# Patient Record
Sex: Female | Born: 1960
Health system: Southern US, Community
[De-identification: ages and names within clinical notes are randomized; demographics above are authoritative.]

## PROBLEM LIST (undated history)

## (undated) DIAGNOSIS — M069 Rheumatoid arthritis, unspecified: Secondary | ICD-10-CM

## (undated) DIAGNOSIS — D649 Anemia, unspecified: Secondary | ICD-10-CM

## (undated) DIAGNOSIS — F32A Depression, unspecified: Secondary | ICD-10-CM

## (undated) DIAGNOSIS — C3490 Malignant neoplasm of unspecified part of unspecified bronchus or lung: Secondary | ICD-10-CM

## (undated) DIAGNOSIS — Z9289 Personal history of other medical treatment: Secondary | ICD-10-CM

## (undated) DIAGNOSIS — J302 Other seasonal allergic rhinitis: Secondary | ICD-10-CM

## (undated) DIAGNOSIS — F17200 Nicotine dependence, unspecified, uncomplicated: Secondary | ICD-10-CM

## (undated) DIAGNOSIS — R0602 Shortness of breath: Secondary | ICD-10-CM

## (undated) DIAGNOSIS — F419 Anxiety disorder, unspecified: Secondary | ICD-10-CM

## (undated) DIAGNOSIS — L409 Psoriasis, unspecified: Secondary | ICD-10-CM

## (undated) DIAGNOSIS — I10 Essential (primary) hypertension: Secondary | ICD-10-CM

## (undated) DIAGNOSIS — K219 Gastro-esophageal reflux disease without esophagitis: Secondary | ICD-10-CM

## (undated) HISTORY — PX: ABDOMINAL HYSTERECTOMY: SHX81

## (undated) HISTORY — PX: ANKLE SURGERY: SHX546

## (undated) HISTORY — DX: Malignant neoplasm of unspecified part of unspecified bronchus or lung: C34.90

## (undated) HISTORY — PX: DILATION AND CURETTAGE OF UTERUS: SHX78

## (undated) HISTORY — PX: COLONOSCOPY W/ POLYPECTOMY: SHX1380

## (undated) HISTORY — DX: Anxiety disorder, unspecified: F41.9

## (undated) HISTORY — DX: Psoriasis, unspecified: L40.9

## (undated) HISTORY — DX: Rheumatoid arthritis, unspecified: M06.9

## (undated) HISTORY — DX: Depression, unspecified: F32.A

## (undated) HISTORY — PX: WISDOM TOOTH EXTRACTION: SHX21

## (undated) HISTORY — DX: Anemia, unspecified: D64.9

## (undated) HISTORY — PX: TUBAL LIGATION: SHX77

---

## 1983-03-30 HISTORY — PX: DIAGNOSTIC LAPAROSCOPY: SUR761

## 2011-08-28 DIAGNOSIS — Z9289 Personal history of other medical treatment: Secondary | ICD-10-CM

## 2011-08-28 HISTORY — DX: Personal history of other medical treatment: Z92.89

## 2011-11-15 ENCOUNTER — Encounter (HOSPITAL_COMMUNITY): Payer: Self-pay | Admitting: Pharmacist

## 2011-11-23 NOTE — H&P (Signed)
Susan Franco is an 51 y.o. female.   Chief Complaint:bleeding HPI: G29P2 with gushing mentrual flow, with clots, soils through clothes.  Happens over last 2 years.    PMH:  HTN,  Elevated cholesterol  PSH:  Salpingectomy for ectopic pregnancy  All:  NKDA  Meds:  Atenolol, amlopidine, omeprazole  Social History:  Married, Eddie.  Smokes 1/2 ppd.  occ alc.  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    Review of Systems  All other systems reviewed and are negative.    Height 6' 1.5" (1.867 m), weight 87.544 kg (193 lb). Physical Exam  Constitutional: She is oriented to person, place, and time. She appears well-developed and well-nourished.  HENT:  Head: Normocephalic and atraumatic.  Eyes: Conjunctivae are normal.  Neck: Normal range of motion. Neck supple. No thyromegaly present.  Cardiovascular: Normal rate, regular rhythm and normal heart sounds.   Respiratory: Effort normal and breath sounds normal.  GI: Soft. Bowel sounds are normal. She exhibits no distension. There is no tenderness.  Genitourinary: Vagina normal.       Uterus nodular 10-12 week size  Adnexa neg  Musculoskeletal: Normal range of motion.  Neurological: She is alert and oriented to person, place, and time.  Skin: Skin is warm and dry.  Psychiatric: She has a normal mood and affect.     Assessment/Plan Fibroid uterus, menorrhagia.  Plan R-TLH, salpingectomy  Susan Franco P 11/23/2011, 5:18 PM

## 2011-11-25 ENCOUNTER — Encounter (HOSPITAL_COMMUNITY): Payer: Self-pay

## 2011-11-25 ENCOUNTER — Encounter (HOSPITAL_COMMUNITY)
Admission: RE | Admit: 2011-11-25 | Discharge: 2011-11-25 | Disposition: A | Discharge status: Home / Self Care | Payer: No Typology Code available for payment source | Source: Ambulatory Visit | Attending: Obstetrics and Gynecology | Admitting: Obstetrics and Gynecology

## 2011-11-25 HISTORY — DX: Essential (primary) hypertension: I10

## 2011-11-25 HISTORY — DX: Personal history of other medical treatment: Z92.89

## 2011-11-25 HISTORY — DX: Nicotine dependence, unspecified, uncomplicated: F17.200

## 2011-11-25 HISTORY — DX: Gastro-esophageal reflux disease without esophagitis: K21.9

## 2011-11-25 HISTORY — DX: Other seasonal allergic rhinitis: J30.2

## 2011-11-25 LAB — CBC
HCT: 37.6 % (ref 36.0–46.0)
Hemoglobin: 13.2 g/dL (ref 12.0–15.0)
MCH: 28.6 pg (ref 26.0–34.0)
MCHC: 35.1 g/dL (ref 30.0–36.0)
MCV: 81.6 fL (ref 78.0–100.0)
Platelets: 240 10*3/uL (ref 150–400)
RBC: 4.61 MIL/uL (ref 3.87–5.11)
RDW: 17.4 % — ABNORMAL HIGH (ref 11.5–15.5)
WBC: 7 10*3/uL (ref 4.0–10.5)

## 2011-11-25 LAB — SURGICAL PCR SCREEN
MRSA, PCR: NEGATIVE
Staphylococcus aureus: NEGATIVE

## 2011-11-25 NOTE — Pre-Procedure Instructions (Signed)
Order needs clarification of which tube to be removed or stated remaining tube---pt had an ectopic and had a removal and can't remember which tube was removed -called office and waiting for return call.

## 2011-11-25 NOTE — Patient Instructions (Addendum)
   Your procedure is scheduled on: Tuesday September 3rd  Enter through the [employer] of [facility] at: [employer] up the phone at the desk and dial [phone] and inform us of your arrival.  Please call this number if you have any problems the morning of surgery: [phone]  Remember: Do not eat or drink anything after midnight Monday Please take your blood pressure medicines (norvasc and atenolol) and prilosec in the morning day of surgery with sips of water  Do not wear jewelry, make-up, or FINGER nail polish No metal in your hair or on your body. Do not wear lotions, powders, perfumes or deodorant. Do not shave 48 hours prior to surgery. Do not bring valuables to the hospital.   Leave suitcase in the car. After Surgery it may be brought to your room. For patients being admitted to the hospital, checkout time is 11:00am the day of discharge.  Patients discharged on the day of surgery will not be allowed to drive home.     Remember to use your hibiclens as instructed.Please shower with 1/2 bottle the evening before your surgery and the other 1/2 bottle the morning of surgery. Neck down avoiding private area.

## 2011-11-29 MED ORDER — DEXTROSE 5 % IV SOLN
2.0000 g | INTRAVENOUS | Status: AC
  Administered 2011-11-30: 2 g via INTRAVENOUS
  Filled 2011-11-29: qty 2

## 2011-11-30 ENCOUNTER — Encounter (HOSPITAL_COMMUNITY): Payer: Self-pay | Admitting: *Deleted

## 2011-11-30 ENCOUNTER — Encounter (HOSPITAL_COMMUNITY)
Admission: RE | Disposition: A | Discharge status: Home / Self Care | Payer: Self-pay | Source: Ambulatory Visit | Attending: Obstetrics and Gynecology

## 2011-11-30 ENCOUNTER — Encounter (HOSPITAL_COMMUNITY): Payer: Self-pay | Admitting: Anesthesiology

## 2011-11-30 ENCOUNTER — Ambulatory Visit (HOSPITAL_COMMUNITY)
Admission: RE | Admit: 2011-11-30 | Discharge: 2011-12-01 | Disposition: A | Discharge status: Home / Self Care | Payer: No Typology Code available for payment source | Source: Ambulatory Visit | Attending: Obstetrics and Gynecology | Admitting: Obstetrics and Gynecology

## 2011-11-30 ENCOUNTER — Ambulatory Visit (HOSPITAL_COMMUNITY): Payer: No Typology Code available for payment source | Admitting: Anesthesiology

## 2011-11-30 DIAGNOSIS — N920 Excessive and frequent menstruation with regular cycle: Secondary | ICD-10-CM | POA: Insufficient documentation

## 2011-11-30 DIAGNOSIS — Z01812 Encounter for preprocedural laboratory examination: Secondary | ICD-10-CM | POA: Insufficient documentation

## 2011-11-30 DIAGNOSIS — Z01818 Encounter for other preprocedural examination: Secondary | ICD-10-CM | POA: Insufficient documentation

## 2011-11-30 DIAGNOSIS — N8000 Endometriosis of the uterus, unspecified: Secondary | ICD-10-CM | POA: Insufficient documentation

## 2011-11-30 DIAGNOSIS — N838 Other noninflammatory disorders of ovary, fallopian tube and broad ligament: Secondary | ICD-10-CM | POA: Insufficient documentation

## 2011-11-30 DIAGNOSIS — D252 Subserosal leiomyoma of uterus: Secondary | ICD-10-CM | POA: Insufficient documentation

## 2011-11-30 DIAGNOSIS — D250 Submucous leiomyoma of uterus: Secondary | ICD-10-CM | POA: Insufficient documentation

## 2011-11-30 DIAGNOSIS — D251 Intramural leiomyoma of uterus: Secondary | ICD-10-CM | POA: Insufficient documentation

## 2011-11-30 HISTORY — PX: CYSTOSCOPY: SHX5120

## 2011-11-30 SURGERY — ROBOTIC ASSISTED TOTAL HYSTERECTOMY
Anesthesia: General | Wound class: Clean Contaminated

## 2011-11-30 MED ORDER — ARTIFICIAL TEARS OP OINT
TOPICAL_OINTMENT | OPHTHALMIC | Status: AC
  Filled 2011-11-30: qty 3.5

## 2011-11-30 MED ORDER — NEOSTIGMINE METHYLSULFATE 1 MG/ML IJ SOLN
INTRAMUSCULAR | Status: DC | PRN
  Administered 2011-11-30: 2 mg via INTRAVENOUS

## 2011-11-30 MED ORDER — INDIGOTINDISULFONATE SODIUM 8 MG/ML IJ SOLN
INTRAMUSCULAR | Status: DC | PRN
  Administered 2011-11-30: 5 mL via INTRAVENOUS

## 2011-11-30 MED ORDER — HYDROMORPHONE HCL PF 1 MG/ML IJ SOLN
0.2500 mg | INTRAMUSCULAR | Status: DC | PRN
  Administered 2011-11-30: 0.5 mg via INTRAVENOUS

## 2011-11-30 MED ORDER — HYDROMORPHONE HCL PF 1 MG/ML IJ SOLN
INTRAMUSCULAR | Status: AC
  Filled 2011-11-30: qty 1

## 2011-11-30 MED ORDER — PROPOFOL 10 MG/ML IV EMUL
INTRAVENOUS | Status: DC | PRN
  Administered 2011-11-30: 200 mg via INTRAVENOUS

## 2011-11-30 MED ORDER — OXYCODONE-ACETAMINOPHEN 5-325 MG PO TABS
1.0000 | ORAL_TABLET | ORAL | Status: DC | PRN
  Administered 2011-11-30 – 2011-12-01 (×5): 2 via ORAL
  Filled 2011-11-30 (×5): qty 2

## 2011-11-30 MED ORDER — NEOSTIGMINE METHYLSULFATE 1 MG/ML IJ SOLN
INTRAMUSCULAR | Status: AC
  Filled 2011-11-30: qty 10

## 2011-11-30 MED ORDER — ROPIVACAINE HCL 5 MG/ML IJ SOLN
INTRAMUSCULAR | Status: DC | PRN
  Administered 2011-11-30: 85 mL via EPIDURAL

## 2011-11-30 MED ORDER — MORPHINE SULFATE 4 MG/ML IJ SOLN: 1.0000 mg | INTRAMUSCULAR | Status: DC | PRN

## 2011-11-30 MED ORDER — DEXAMETHASONE SODIUM PHOSPHATE 10 MG/ML IJ SOLN
INTRAMUSCULAR | Status: AC
  Filled 2011-11-30: qty 1

## 2011-11-30 MED ORDER — DEXAMETHASONE SODIUM PHOSPHATE 4 MG/ML IJ SOLN
INTRAMUSCULAR | Status: DC | PRN
  Administered 2011-11-30: 10 mg via INTRAVENOUS

## 2011-11-30 MED ORDER — FENTANYL CITRATE 0.05 MG/ML IJ SOLN
INTRAMUSCULAR | Status: DC | PRN
  Administered 2011-11-30: 150 ug via INTRAVENOUS
  Administered 2011-11-30: 100 ug via INTRAVENOUS

## 2011-11-30 MED ORDER — PROPOFOL 10 MG/ML IV EMUL
INTRAVENOUS | Status: AC
  Filled 2011-11-30: qty 20

## 2011-11-30 MED ORDER — FENTANYL CITRATE 0.05 MG/ML IJ SOLN
INTRAMUSCULAR | Status: AC
  Filled 2011-11-30: qty 5

## 2011-11-30 MED ORDER — KETOROLAC TROMETHAMINE 30 MG/ML IJ SOLN
INTRAMUSCULAR | Status: AC
  Filled 2011-11-30: qty 1

## 2011-11-30 MED ORDER — EPHEDRINE 5 MG/ML INJ
INTRAVENOUS | Status: AC
  Filled 2011-11-30: qty 10

## 2011-11-30 MED ORDER — KETOROLAC TROMETHAMINE 30 MG/ML IJ SOLN
INTRAMUSCULAR | Status: DC | PRN
  Administered 2011-11-30: 30 mg via INTRAVENOUS

## 2011-11-30 MED ORDER — INDIGOTINDISULFONATE SODIUM 8 MG/ML IJ SOLN
INTRAMUSCULAR | Status: AC
  Filled 2011-11-30: qty 10

## 2011-11-30 MED ORDER — MIDAZOLAM HCL 5 MG/5ML IJ SOLN
INTRAMUSCULAR | Status: DC | PRN
  Administered 2011-11-30: 2 mg via INTRAVENOUS

## 2011-11-30 MED ORDER — ROCURONIUM BROMIDE 50 MG/5ML IV SOLN
INTRAVENOUS | Status: AC
  Filled 2011-11-30: qty 1

## 2011-11-30 MED ORDER — LACTATED RINGERS IV SOLN
INTRAVENOUS | Status: DC
  Administered 2011-11-30 (×3): via INTRAVENOUS

## 2011-11-30 MED ORDER — ONDANSETRON HCL 4 MG/2ML IJ SOLN
INTRAMUSCULAR | Status: AC
  Filled 2011-11-30: qty 2

## 2011-11-30 MED ORDER — GLYCOPYRROLATE 0.2 MG/ML IJ SOLN
INTRAMUSCULAR | Status: DC | PRN
  Administered 2011-11-30: 0.4 mg via INTRAVENOUS
  Administered 2011-11-30: 0.2 mg via INTRAVENOUS

## 2011-11-30 MED ORDER — HYDROMORPHONE HCL PF 1 MG/ML IJ SOLN
INTRAMUSCULAR | Status: AC
  Administered 2011-11-30: 0.5 mg via INTRAVENOUS
  Filled 2011-11-30: qty 1

## 2011-11-30 MED ORDER — MIDAZOLAM HCL 2 MG/2ML IJ SOLN
INTRAMUSCULAR | Status: AC
  Filled 2011-11-30: qty 2

## 2011-11-30 MED ORDER — ROCURONIUM BROMIDE 100 MG/10ML IV SOLN
INTRAVENOUS | Status: DC | PRN
  Administered 2011-11-30: 50 mg via INTRAVENOUS
  Administered 2011-11-30: 20 mg via INTRAVENOUS

## 2011-11-30 MED ORDER — LIDOCAINE HCL (CARDIAC) 20 MG/ML IV SOLN
INTRAVENOUS | Status: AC
  Filled 2011-11-30: qty 5

## 2011-11-30 MED ORDER — LIDOCAINE HCL (CARDIAC) 20 MG/ML IV SOLN
INTRAVENOUS | Status: DC | PRN
  Administered 2011-11-30: 50 mg via INTRAVENOUS

## 2011-11-30 MED ORDER — PNEUMOCOCCAL VAC POLYVALENT 25 MCG/0.5ML IJ INJ
0.5000 mL | INJECTION | INTRAMUSCULAR | Status: AC
  Administered 2011-12-01: 0.5 mL via INTRAMUSCULAR
  Filled 2011-11-30: qty 0.5

## 2011-11-30 MED ORDER — EPHEDRINE SULFATE 50 MG/ML IJ SOLN
INTRAMUSCULAR | Status: DC | PRN
  Administered 2011-11-30 (×3): 10 mg via INTRAVENOUS

## 2011-11-30 MED ORDER — ONDANSETRON HCL 4 MG/2ML IJ SOLN
INTRAMUSCULAR | Status: DC | PRN
  Administered 2011-11-30: 4 mg via INTRAVENOUS

## 2011-11-30 MED ORDER — LACTATED RINGERS IR SOLN
Status: DC | PRN
  Administered 2011-11-30: 3000 mL

## 2011-11-30 MED ORDER — ROPIVACAINE HCL 5 MG/ML IJ SOLN
INTRAMUSCULAR | Status: AC
  Filled 2011-11-30: qty 60

## 2011-11-30 MED ORDER — GLYCOPYRROLATE 0.2 MG/ML IJ SOLN
INTRAMUSCULAR | Status: AC
  Filled 2011-11-30: qty 3

## 2011-11-30 SURGICAL SUPPLY — 77 items
APL SKNCLS STERI-STRIP NONHPOA (GAUZE/BANDAGES/DRESSINGS) ×2
BAG URINE DRAINAGE (UROLOGICAL SUPPLIES) ×3 IMPLANT
BARRIER ADHS 3X4 INTERCEED (GAUZE/BANDAGES/DRESSINGS) ×3 IMPLANT
BENZOIN TINCTURE PRP APPL 2/3 (GAUZE/BANDAGES/DRESSINGS) ×3 IMPLANT
BRR ADH 4X3 ABS CNTRL BYND (GAUZE/BANDAGES/DRESSINGS) ×2
CABLE HIGH FREQUENCY MONO STRZ (ELECTRODE) ×3 IMPLANT
CATH FOLEY 3WAY  5CC 16FR (CATHETERS) ×1
CATH FOLEY 3WAY 5CC 16FR (CATHETERS) IMPLANT
CHLORAPREP W/TINT 26ML (MISCELLANEOUS) ×3 IMPLANT
CONT PATH 16OZ SNAP LID 3702 (MISCELLANEOUS) ×3 IMPLANT
COVER MAYO STAND STRL (DRAPES) ×3 IMPLANT
COVER TABLE BACK 60X90 (DRAPES) ×6 IMPLANT
COVER TIP SHEARS 8 DVNC (MISCELLANEOUS) ×2 IMPLANT
COVER TIP SHEARS 8MM DA VINCI (MISCELLANEOUS) ×1
DECANTER SPIKE VIAL GLASS SM (MISCELLANEOUS) ×3 IMPLANT
DRAPE HUG U DISPOSABLE (DRAPE) ×3 IMPLANT
DRAPE LG THREE QUARTER DISP (DRAPES) ×6 IMPLANT
DRAPE MONITOR DA VINCI (DRAPE) IMPLANT
DRAPE WARM FLUID 44X44 (DRAPE) ×3 IMPLANT
ELECT REM PT RETURN 9FT ADLT (ELECTROSURGICAL) ×3
ELECTRODE REM PT RTRN 9FT ADLT (ELECTROSURGICAL) ×2 IMPLANT
EVACUATOR SMOKE 8.L (FILTER) ×3 IMPLANT
GAUZE VASELINE 3X9 (GAUZE/BANDAGES/DRESSINGS) IMPLANT
GLOVE BIO SURGEON STRL SZ 6.5 (GLOVE) ×9 IMPLANT
GLOVE BIOGEL PI IND STRL 7.0 (GLOVE) ×8 IMPLANT
GLOVE BIOGEL PI INDICATOR 7.0 (GLOVE) ×4
GLOVE ECLIPSE 6.5 STRL STRAW (GLOVE) ×12 IMPLANT
GOWN STRL REIN XL XLG (GOWN DISPOSABLE) ×18 IMPLANT
GYRUS RUMI II 2.5CM BLUE (DISPOSABLE)
GYRUS RUMI II 3.5CM BLUE (DISPOSABLE)
GYRUS RUMI II 4.0CM BLUE (DISPOSABLE)
KIT ACCESSORY DA VINCI DISP (KITS) ×1
KIT ACCESSORY DVNC DISP (KITS) ×2 IMPLANT
KIT DISP ACCESSORY 4 ARM (KITS) IMPLANT
NDL INSUFFLATION 14GA 120MM (NEEDLE) ×2 IMPLANT
NEEDLE INSUFFLATION 14GA 120MM (NEEDLE) ×3 IMPLANT
OCCLUDER COLPOPNEUMO (BALLOONS) ×1 IMPLANT
PACK LAVH (CUSTOM PROCEDURE TRAY) ×3 IMPLANT
PAD PREP 24X48 CUFFED NSTRL (MISCELLANEOUS) ×6 IMPLANT
PLUG CATH AND CAP STER (CATHETERS) ×4 IMPLANT
PROTECTOR NERVE ULNAR (MISCELLANEOUS) ×6 IMPLANT
RUMI II 3.0CM BLUE KOH-EFFICIE (DISPOSABLE) IMPLANT
RUMI II GYRUS 2.5CM BLUE (DISPOSABLE) IMPLANT
RUMI II GYRUS 3.5CM BLUE (DISPOSABLE) IMPLANT
RUMI II GYRUS 4.0CM BLUE (DISPOSABLE) IMPLANT
SET CYSTO W/LG BORE CLAMP LF (SET/KITS/TRAYS/PACK) ×4 IMPLANT
SET IRRIG TUBING LAPAROSCOPIC (IRRIGATION / IRRIGATOR) ×3 IMPLANT
SOLUTION ELECTROLUBE (MISCELLANEOUS) ×3 IMPLANT
SPONGE LAP 18X18 X RAY DECT (DISPOSABLE) IMPLANT
STRIP CLOSURE SKIN 1/4X4 (GAUZE/BANDAGES/DRESSINGS) ×3 IMPLANT
SUT VIC AB 0 CT1 27 (SUTURE) ×3
SUT VIC AB 0 CT1 27XBRD ANBCTR (SUTURE) ×2 IMPLANT
SUT VIC AB 0 CT1 27XBRD ANTBC (SUTURE) IMPLANT
SUT VICRYL 0 UR6 27IN ABS (SUTURE) ×3 IMPLANT
SUT VICRYL RAPIDE 4/0 PS 2 (SUTURE) ×6 IMPLANT
SUT VLOC 180 0 9IN  GS21 (SUTURE) ×2
SUT VLOC 180 0 9IN GS21 (SUTURE) IMPLANT
SYR 30ML LL (SYRINGE) ×3 IMPLANT
SYR 50ML LL SCALE MARK (SYRINGE) ×3 IMPLANT
SYRINGE 10CC LL (SYRINGE) ×3 IMPLANT
SYSTEM CONVERTIBLE TROCAR (TROCAR) ×3 IMPLANT
TIP UTERINE 5.1X6CM LAV DISP (MISCELLANEOUS) IMPLANT
TIP UTERINE 6.7X10CM GRN DISP (MISCELLANEOUS) ×1 IMPLANT
TIP UTERINE 6.7X6CM WHT DISP (MISCELLANEOUS) IMPLANT
TIP UTERINE 6.7X8CM BLUE DISP (MISCELLANEOUS) IMPLANT
TOWEL OR 17X24 6PK STRL BLUE (TOWEL DISPOSABLE) ×9 IMPLANT
TRAY FOLEY BAG SILVER LF 14FR (CATHETERS) ×3 IMPLANT
TRAY FOLEY CATH 14FR (SET/KITS/TRAYS/PACK) ×1 IMPLANT
TROCAR 12M 150ML BLUNT (TROCAR) IMPLANT
TROCAR DISP BLADELESS 8 DVNC (TROCAR) ×2 IMPLANT
TROCAR DISP BLADELESS 8MM (TROCAR) ×1
TROCAR XCEL 12X100 BLDLESS (ENDOMECHANICALS) IMPLANT
TROCAR XCEL NON-BLD 5MMX100MML (ENDOMECHANICALS) ×3 IMPLANT
TROCAR Z THRD FIOS 12X150 (TROCAR) ×3 IMPLANT
TUBING FILTER THERMOFLATOR (ELECTROSURGICAL) ×3 IMPLANT
WARMER LAPAROSCOPE (MISCELLANEOUS) ×3 IMPLANT
WATER STERILE IRR 1000ML POUR (IV SOLUTION) ×9 IMPLANT

## 2011-11-30 NOTE — Anesthesia Postprocedure Evaluation (Signed)
  Anesthesia Post-op Note  Patient: Susan Franco  Procedure(s) Performed: Procedure(s) (LRB) with comments: ROBOTIC ASSISTED TOTAL HYSTERECTOMY (N/A) CYSTOSCOPY (N/A) UNILATERAL SALPINGECTOMY (Left)  Patient Location: PACU and Women's Unit  Anesthesia Type: General  Level of Consciousness: awake, alert  and oriented  Airway and Oxygen Therapy: Patient connected to nasal cannula oxygen  Post-op Pain: mild  Post-op Assessment: Patient's Cardiovascular Status Stable, Respiratory Function Stable, No signs of Nausea or vomiting and Pain level controlled  Post-op Vital Signs: stable  Complications: No apparent anesthesia complications

## 2011-11-30 NOTE — Addendum Note (Signed)
Addendum  created 11/30/11 1616 by Elbert Ewings, CRNA   Modules edited:Notes Section

## 2011-11-30 NOTE — Op Note (Signed)
Preoperative diagnosis: Menorrhagia, uterine fibroids Postoperative diagnosis: Same, path pending Procedure: Robotic assisted total laparoscopic hysterectomy with partial salpingectomy on left Surgeon Dr. Meredeth Ide Assistant Dr. Leda Quail Anesthesia: Gen. endotracheal Estimated blood loss: 50 cc Complications: None Procedure: The patient was taken to the operating room and after induction of adequate general endotracheal anesthesia was placed in the low dorsolithotomy position and prepped and draped in usual fashion.  A posterior weighted and anterior Deaver were placed, and the cervix was grasped on its anterior lip with a single-tooth tenaculum.  The uterus sounded to 11 cm.  The cervix was dilated to #1 Shawnie Pons.  A 10 cm Rumi manipulator with a large Koh ring was placed around the cervix without difficulty.  A Foley catheter was inserted.  The assistant had protruded the skin just above the umbilicus with quarter percent ropivacaine incised with a knife and inserted the varies needle.  Proper placement a varies needle was tested by the hanging drop technique.  The peritoneum was created with the automatic insufflator to 2.5 L of CO2.  A disposable 1012 mm bladed trocar was then inserted into the peritoneum, and its proper placement noted with the laparoscope.  The abdominal wall was then transilluminated and the placement of the robotic ports was marked these were infiltrated with a dilute ropivacaine solution incised with a knife and the robotic trochars were inserted under direct visualization.  A 5 mm assistant's port was likewise inserted under direct visualization in the right lower quadrant.  The abdomen and pelvis were inspected.  The upper abdomen appeared normal.  The liver was smooth, gallbladder was mildly distended.  In the pelvis, the uterus was enlarged to about 12 cm, with multiple 2-3 cm fibroids extending fundally.  The tube on the patient's right was absent and on the left there  was a large segment that had been taking out of the isthmus.  The robot was brought in and docked on the patient's right side.  The PK gyrus was placed through port 2 and monopolar scissors were placed through port one under direct visualization.  The surgeon then proceeded to the console.  The procedure began by resecting the distal end of the patient's left fallopian tube.  Connection to the ovary and the mesosalpinx was cauterized with the gyrus and then cut.  The sensitivity was placed in the posterior cul-de-sac for later removal.  The ureters were identified peristalsing bilaterally.  The procedure began on the patient's left cauterizing and then cutting the utero-ovarian ligaments and round ligaments.  The anterior posterior leaves of the broad ligament were taken down sharply.  The left uterine artery was skeletonized.  Attention was next turned to the patient's right.  The utero-ovarian ligament and round ligament were cauterized and cut.  The anterior posterior leafs of the broad ligament were taken down sharply.  The bladder was dissected off the [facility] ring.  The right uterine uterine artery was skeletonized.  The artery was coagulated multiple times and then cut.  Attention was then turned back to the patient's left.  The left uterine artery was coagulated multiple times and cut.  A circumferential colpotomy incision was then made with the monopolar scissors.  The specimen was removed into the vagina and the distal end of the left fallopian tube was also placed and the vagina.  The occluder was placed back in the vagina.  Robotic instruments were then changed out for a mega-suture cut in 1 and a Cobra grasper in 2.  A  0V LOC suture was dropped in through the camera port and brought into the pelvis.  The vagina was closed with a running suture of the V. LOC.  Hemostasis was achieved.  The needle was parked in the right paracolic gutter for later retrieval.  Pelvis was irrigated.  The pedicles were  hemostatic.  There was a small amount of bleeding on the cuff on the patient's right that was focally coagulated with the monopolar scissors were placed in through port one.  Scissors were then exchanged for the suture cut needle-holder, and Interceed was brought into one of the robotic ports.  Instruments were then replaced.  The Interceed was spread over the vaginal cuff.  Instruments were then removed from the abdomen.  An 8mm robotic camera was placed and the [place] grasper was used to extract the needle from the paracolic gutter.  The trochars were removed under direct visualization.  Pneumoperitoneum was allowed to escape through the umbilicus.  After removing umbilical trocar, the fascia was closed with a single suture of 0 Vicryl.  The skin was closed subcuticularly with 4-0 Vicryl Rapide, and Dermabond.  The surgeon then proceeded to cystoscopy.  The Foley catheter was removed.  The cystoscope was introduced and 300 cc of fluid was placed in the bladder.  An amp of indigo carmine had been given  several minutes prior to cystoscopy, but before the indigo carmine was seen in the bladder the ureters bilaterally could be seen extruding yellow urine.  The bladder was inspected was otherwise intact.  The bladder was allowed to drain, and the Foley catheter was left out.  The procedure was terminated.  Sponge needle isthmic counts were correct.  Patient was taken to the recovery room in satisfactory condition.

## 2011-11-30 NOTE — Anesthesia Preprocedure Evaluation (Signed)
Anesthesia Evaluation  Patient identified by MRN, date of birth, ID band Patient awake    Reviewed: Allergy & Precautions, H&P , Patient's Chart, lab work & pertinent test results, reviewed documented beta blocker date and time   Airway Mallampati: II TM Distance: >3 FB Neck ROM: full    Dental No notable dental hx.    Pulmonary  breath sounds clear to auscultation  Pulmonary exam normal       Cardiovascular hypertension, Pt. on medications and On Medications Rhythm:regular Rate:Normal     Neuro/Psych    GI/Hepatic GERD-  Medicated,  Endo/Other    Renal/GU      Musculoskeletal   Abdominal   Peds  Hematology   Anesthesia Other Findings Upper caps. i9ntact  Reproductive/Obstetrics                           Anesthesia Physical Anesthesia Plan  ASA: II  Anesthesia Plan: General   Post-op Pain Management:    Induction: Intravenous  Airway Management Planned: Oral ETT  Additional Equipment:   Intra-op Plan:   Post-operative Plan:   Informed Consent: I have reviewed the patients History and Physical, chart, labs and discussed the procedure including the risks, benefits and alternatives for the proposed anesthesia with the patient or authorized representative who has indicated his/her understanding and acceptance.   Dental Advisory Given and Dental advisory given  Plan Discussed with: CRNA and Surgeon  Anesthesia Plan Comments: (  Discussed  general anesthesia, including possible nausea, instrumentation of airway, sore throat,pulmonary aspiration, etc. I asked if the were any outstanding questions, or  concerns before we proceeded. )        Anesthesia Quick Evaluation

## 2011-11-30 NOTE — Transfer of Care (Signed)
Immediate Anesthesia Transfer of Care Note  Patient: Susan Franco  Procedure(s) Performed: Procedure(s) (LRB): ROBOTIC ASSISTED TOTAL HYSTERECTOMY (N/A) CYSTOSCOPY (N/A) UNILATERAL SALPINGECTOMY (Left)  Patient Location: PACU  Anesthesia Type: General  Level of Consciousness: awake, alert  and oriented  Airway & Oxygen Therapy: Patient Spontanous Breathing and Patient connected to nasal cannula oxygen  Post-op Assessment: Report given to PACU RN and Post -op Vital signs reviewed and stable  Post vital signs: Reviewed and stable  Complications: No apparent anesthesia complications

## 2011-11-30 NOTE — Interval H&P Note (Signed)
History and Physical Interval Note:  11/30/2011 7:18 AM  Susan Franco  has presented today for surgery, with the diagnosis of Menorrhagia  The various methods of treatment have been discussed with the patient and family. After consideration of risks, benefits and other options for treatment, the patient has consented to  Procedure(s) (LRB): ROBOTIC ASSISTED TOTAL HYSTERECTOMY (N/A) CYSTOSCOPY (N/A) as a surgical intervention .  The patient's history has been reviewed, patient examined, no change in status, stable for surgery.  I have reviewed the patient's chart and labs.  Questions were answered to the patient's satisfaction.     Zyaire Dumas P

## 2011-11-30 NOTE — Anesthesia Postprocedure Evaluation (Signed)
Anesthesia Post Note  Patient: Susan Franco  Procedure(s) Performed: Procedure(s) (LRB): ROBOTIC ASSISTED TOTAL HYSTERECTOMY (N/A) CYSTOSCOPY (N/A) UNILATERAL SALPINGECTOMY (Left)  Anesthesia type: General  Patient location: PACU  Post pain: Pain level controlled  Post assessment: Post-op Vital signs reviewed  Last Vitals:  Filed Vitals:   11/30/11 0933  BP:   Pulse: 62  Temp: 36.7 C  Resp:     Post vital signs: Reviewed  Level of consciousness: sedated  Complications: No apparent anesthesia complicationsfj

## 2011-12-01 ENCOUNTER — Encounter (HOSPITAL_COMMUNITY): Payer: Self-pay | Admitting: Obstetrics and Gynecology

## 2011-12-01 MED ORDER — SODIUM CHLORIDE 0.9 % IJ SOLN: 3.0000 mL | INTRAMUSCULAR | Status: DC | PRN

## 2011-12-01 MED ORDER — ACETAMINOPHEN 325 MG PO TABS: 650.0000 mg | ORAL_TABLET | ORAL | Status: DC | PRN

## 2011-12-01 MED ORDER — OXYCODONE HCL 5 MG PO TABS: 5.0000 mg | ORAL_TABLET | ORAL | Status: DC | PRN

## 2011-12-01 MED ORDER — ACETAMINOPHEN 650 MG RE SUPP: 650.0000 mg | RECTAL | Status: DC | PRN

## 2011-12-01 MED ORDER — SODIUM CHLORIDE 0.9 % IJ SOLN: 3.0000 mL | Freq: Two times a day (BID) | INTRAMUSCULAR | Status: DC

## 2011-12-01 MED ORDER — ONDANSETRON HCL 4 MG/2ML IJ SOLN: 4.0000 mg | Freq: Four times a day (QID) | INTRAMUSCULAR | Status: DC | PRN

## 2011-12-01 MED ORDER — OXYCODONE-ACETAMINOPHEN 5-325 MG PO TABS
2.0000 | ORAL_TABLET | ORAL | Status: DC | PRN
  Administered 2011-12-01: 2 via ORAL
  Filled 2011-12-01: qty 2

## 2011-12-01 MED ORDER — SODIUM CHLORIDE 0.9 % IV SOLN: 250.0000 mL | INTRAVENOUS | Status: DC | PRN

## 2012-01-04 ENCOUNTER — Encounter (HOSPITAL_COMMUNITY): Admission: RE | Payer: Self-pay | Source: Ambulatory Visit

## 2012-01-04 ENCOUNTER — Ambulatory Visit (HOSPITAL_COMMUNITY): Admission: RE | Admit: 2012-01-04 | Payer: Self-pay | Source: Ambulatory Visit | Admitting: Obstetrics and Gynecology

## 2012-01-04 SURGERY — ROBOTIC ASSISTED TOTAL HYSTERECTOMY
Anesthesia: General

## 2012-10-10 ENCOUNTER — Encounter: Payer: Self-pay | Admitting: Obstetrics and Gynecology

## 2012-10-13 ENCOUNTER — Encounter: Payer: Self-pay | Admitting: Obstetrics and Gynecology

## 2012-10-13 ENCOUNTER — Ambulatory Visit (INDEPENDENT_AMBULATORY_CARE_PROVIDER_SITE_OTHER): Payer: No Typology Code available for payment source | Admitting: Obstetrics and Gynecology

## 2012-10-13 VITALS — BP 122/80 | HR 80 | Resp 18 | Ht 71.5 in | Wt 195.0 lb

## 2012-10-13 DIAGNOSIS — Z01419 Encounter for gynecological examination (general) (routine) without abnormal findings: Secondary | ICD-10-CM

## 2012-10-13 NOTE — Patient Instructions (Addendum)

## 2012-10-13 NOTE — Progress Notes (Signed)
52 y.o.   Married    Caucasian   female   [phone]   here for annual exam. On neurontin for foot pain.     Patient's last menstrual period was 11/15/2011.          Sexually active: no  The current method of family planning is status post hysterectomy.    Exercising: walking 15 min 4 days a week Last mammogram:  03/2012 normal Last pap smear:05/2010 normal History of abnormal pap: no Smoking:quit 03/2012 after 32years - congratulated!! Alcohol: an occ beer Last colonoscopy: 03/2012 (2) polyps, repeat in 5years Last Bone Density:  never Last tetanus shot: not sure Last cholesterol check: 09/04/2012 slightly elevated no medication needed   Hgb:   pcp             Urine: pcp   Family History  Problem Relation Age of Onset  . Osteoporosis Mother   . Cancer Mother     renal cell, brain  . CAD Father   . CAD Paternal Uncle   . Parkinson's disease Maternal Grandfather   . Cervical cancer Paternal Grandmother   . CAD Paternal Grandfather     There are no active problems to display for this patient.   Past Medical History  Diagnosis Date  . GERD (gastroesophageal reflux disease)     prilosec  . Seasonal allergies   . Smoker   . History of EKG 08/2011    was reflux-done at [facility]-will request  . Hypertension     atenolol and norvasc    Past Surgical History  Procedure Laterality Date  . Diagnostic laparoscopy  1985    ectopic  . Cystoscopy  11/30/2011    Procedure: CYSTOSCOPY;  Surgeon: Alison Murray, MD;  Location: [facility];  Service: Gynecology;  Laterality: N/A;    Allergies: Review of patient's allergies indicates no known allergies.  Current Outpatient Prescriptions  Medication Sig Dispense Refill  . amLODipine (NORVASC) 5 MG tablet Take 5 mg by mouth daily.      Marland Kitchen atenolol (TENORMIN) 50 MG tablet Take 50 mg by mouth daily.      Marland Kitchen gabapentin (NEURONTIN) 100 MG capsule       . hydrochlorothiazide (HYDRODIURIL) 25 MG tablet       . omeprazole (PRILOSEC) 40 MG  capsule Take 40 mg by mouth daily.      . potassium chloride SA (K-DUR,KLOR-CON) 20 MEQ tablet        No current facility-administered medications for this visit.    ROS: Pertinent items are noted in HPI.  Social Hx: Married, two children, works as an Print production planner   Exam:    BP 122/80  Pulse 80  Resp 18  Ht 5' 11.5" (1.816 m)  Wt 195 lb (88.451 kg)  BMI 26.82 kg/m2  LMP 11/15/2011  Ht and wt stable since last year Wt Readings from Last 3 Encounters:  10/13/12 195 lb (88.451 kg)  11/30/11 198 lb (89.812 kg)  11/30/11 198 lb (89.812 kg)     Ht Readings from Last 3 Encounters:  10/13/12 5' 11.5" (1.816 m)  11/30/11 6' 1.5" (1.867 m)  11/30/11 6' 1.5" (1.867 m)    General appearance: alert, cooperative and appears stated age Head: Normocephalic, without obvious abnormality, atraumatic Neck: no adenopathy, supple, symmetrical, trachea midline and thyroid not enlarged, symmetric, no tenderness/mass/nodules Lungs: clear to auscultation bilaterally Breasts: Inspection negative, No nipple retraction or dimpling, No nipple discharge or bleeding, No axillary or supraclavicular adenopathy, Normal  to palpation without dominant masses Heart: regular rate and rhythm Abdomen: soft, non-tender; bowel sounds normal; no masses,  no organomegaly Extremities: extremities normal, atraumatic, no cyanosis or edema Skin: Skin color, texture, turgor normal. No rashes or lesions Lymph nodes: Cervical, supraclavicular, and axillary nodes normal. No abnormal inguinal nodes palpated Neurologic: Grossly normal   Pelvic: External genitalia:  no lesions              Urethra:  normal appearing urethra with no masses, tenderness or lesions              Bartholins and Skenes: normal                 Vagina: normal appearing vagina with normal color and discharge, no lesions              Cervix: absent              Pap taken: no        Bimanual Exam:  Uterus:  absent                                       Adnexa: normal adnexa in size, nontender and no masses                                      Rectovaginal: Confirms                                      Anus:  normal sphincter tone, no lesions  A: normal menopausal exam, no Hrt     S/p R-TLH, salpingec Sept 2013 for fibroids 223g     HTN, elevated chol     P: mammogram counseled on breast self exam, mammography screening, adequate intake of calcium and vitamin D, diet and exercise return annually or prn     An After Visit Summary was printed and given to the patient.

## 2013-10-18 ENCOUNTER — Ambulatory Visit (INDEPENDENT_AMBULATORY_CARE_PROVIDER_SITE_OTHER): Payer: 59 | Admitting: Obstetrics and Gynecology

## 2013-10-18 ENCOUNTER — Encounter: Payer: Self-pay | Admitting: Obstetrics and Gynecology

## 2013-10-18 VITALS — BP 130/80 | HR 70 | Resp 18 | Ht 71.0 in | Wt 198.0 lb

## 2013-10-18 DIAGNOSIS — Z0000 Encounter for general adult medical examination without abnormal findings: Secondary | ICD-10-CM

## 2013-10-18 DIAGNOSIS — F17200 Nicotine dependence, unspecified, uncomplicated: Secondary | ICD-10-CM

## 2013-10-18 DIAGNOSIS — Z01419 Encounter for gynecological examination (general) (routine) without abnormal findings: Secondary | ICD-10-CM

## 2013-10-18 LAB — POCT URINALYSIS DIPSTICK
Bilirubin, UA: NEGATIVE
Blood, UA: NEGATIVE
Glucose, UA: NEGATIVE
Ketones, UA: NEGATIVE
Leukocytes, UA: NEGATIVE
Nitrite, UA: NEGATIVE
Protein, UA: NEGATIVE
Urobilinogen, UA: NEGATIVE
pH, UA: 5

## 2013-10-18 NOTE — Patient Instructions (Signed)
EXERCISE AND DIET:  We recommended that you start or continue a regular exercise program for good health. Regular exercise means any activity that makes your heart beat faster and makes you sweat.  We recommend exercising at least 30 minutes per day at least 3 days a week, preferably 4 or 5.  We also recommend a diet low in fat and sugar.  Inactivity, poor dietary choices and obesity can cause diabetes, heart attack, stroke, and kidney damage, among others.    ALCOHOL AND SMOKING:  Women should limit their alcohol intake to no more than 7 drinks/beers/glasses of wine (combined, not each!) per week. Moderation of alcohol intake to this level decreases your risk of breast cancer and liver damage. And of course, no recreational drugs are part of a healthy lifestyle.  And absolutely no smoking or even second hand smoke. Most people know smoking can cause heart and lung diseases, but did you know it also contributes to weakening of your bones? Aging of your skin?  Yellowing of your teeth and nails?  CALCIUM AND VITAMIN D:  Adequate intake of calcium and Vitamin D are recommended.  The recommendations for exact amounts of these supplements seem to change often, but generally speaking 600 mg of calcium (either carbonate or citrate) and 800 units of Vitamin D per day seems prudent. Certain women may benefit from higher intake of Vitamin D.  If you are among these women, your doctor will have told you during your visit.    PAP SMEARS:  Pap smears, to check for cervical cancer or precancers,  have traditionally been done yearly, although recent scientific advances have shown that most women can have pap smears less often.  However, every woman still should have a physical exam from her gynecologist every year. It will include a breast check, inspection of the vulva and vagina to check for abnormal growths or skin changes, a visual exam of the cervix, and then an exam to evaluate the size and shape of the uterus and  ovaries.  And after 52 years of age, a rectal exam is indicated to check for rectal cancers. We will also provide age appropriate advice regarding health maintenance, like when you should have certain vaccines, screening for sexually transmitted diseases, bone density testing, colonoscopy, mammograms, etc.   MAMMOGRAMS:  All women over 40 years old should have a yearly mammogram. Many facilities now offer a "3D" mammogram, which may cost around $50 extra out of pocket. If possible,  we recommend you accept the option to have the 3D mammogram performed.  It both reduces the number of women who will be called back for extra views which then turn out to be normal, and it is better than the routine mammogram at detecting truly abnormal areas.    COLONOSCOPY:  Colonoscopy to screen for colon cancer is recommended for all women at age 50.  We know, you hate the idea of the prep.  We agree, BUT, having colon cancer and not knowing it is worse!!  Colon cancer so often starts as a polyp that can be seen and removed at colonscopy, which can quite literally save your life!  And if your first colonoscopy is normal and you have no family history of colon cancer, most women don't have to have it again for 10 years.  Once every ten years, you can do something that may end up saving your life, right?  We will be happy to help you get it scheduled when you are ready.    Be sure to check your insurance coverage so you understand how much it will cost.  It may be covered as a preventative service at no cost, but you should check your particular policy.     Bupropion sustained-release tablets (smoking cessation) What is this medicine? BUPROPION (byoo PROE pee on) is used to help people quit smoking. This medicine may be used for other purposes; ask your health care provider or pharmacist if you have questions. COMMON BRAND NAME(S): Buproban, Zyban What should I tell my health care provider before I take this medicine? They  need to know if you have any of these conditions: -an eating disorder, such as anorexia or bulimia -bipolar disorder or psychosis -diabetes or high blood sugar, treated with medication -glaucoma -head injury or brain tumor -heart disease, previous heart attack, or irregular heart beat -high blood pressure -kidney or liver disease -seizures -suicidal thoughts or a previous suicide attempt -Tourette's syndrome -weight loss -an unusual or allergic reaction to bupropion, other medicines, foods, dyes, or preservatives -breast-feeding -pregnant or trying to become pregnant How should I use this medicine? Take this medicine by mouth with a glass of water. Follow the directions on the prescription label. You can take it with or without food. If it upsets your stomach, take it with food. Do not cut, crush or chew this medicine. Take your medicine at regular intervals. If you take this medicine more than once a day, take your second dose at least 8 hours after you take your first dose. To limit difficulty in sleeping, avoid taking this medicine at bedtime. Do not take your medicine more often than directed. Do not stop taking this medicine suddenly except upon the advice of your doctor. Stopping this medicine too quickly may cause serious side effects. A special MedGuide will be given to you by the pharmacist with each prescription and refill. Be sure to read this information carefully each time. Talk to your pediatrician regarding the use of this medicine in children. Special care may be needed. Overdosage: If you think you have taken too much of this medicine contact a poison control center or emergency room at once. NOTE: This medicine is only for you. Do not share this medicine with others. What if I miss a dose? If you miss a dose, skip the missed dose and take your next tablet at the regular time. There should be at least 8 hours between doses. Do not take double or extra doses. What may interact  with this medicine? Do not take this medicine with any of the following medications: -linezolid -MAOIs like Azilect, Carbex, Eldepryl, Marplan, Nardil, and Parnate -methylene blue (injected into a vein) -other medicines that contain bupropion like Wellbutrin This medicine may also interact with the following medications: -alcohol -certain medicines for anxiety or sleep -certain medicines for blood pressure like metoprolol, propranolol -certain medicines for depression or psychotic disturbances -certain medicines for HIV or AIDS like efavirenz, lopinavir, nelfinavir, ritonavir -certain medicines for irregular heart beat like propafenone, flecainide -certain medicines for Parkinson's disease like amantadine, levodopa -certain medicines for seizures like carbamazepine, phenytoin, phenobarbital -cimetidine -clopidogrel -cyclophosphamide -furazolidone -isoniazid -nicotine -orphenadrine -procarbazine -steroid medicines like prednisone or cortisone -stimulant medicines for attention disorders, weight loss, or to stay awake -tamoxifen -theophylline -thiotepa -ticlopidine -tramadol -warfarin This list may not describe all possible interactions. Give your health care provider a list of all the medicines, herbs, non-prescription drugs, or dietary supplements you use. Also tell them if you smoke, drink alcohol, or use illegal drugs.  Some items may interact with your medicine. What should I watch for while using this medicine? Visit your doctor or health care professional for regular checks on your progress. This medicine should be used together with a patient support program. It is important to participate in a behavioral program, counseling, or other support program that is recommended by your health care professional. Patients and their families should watch out for new or worsening thoughts of suicide or depression. Also watch out for sudden changes in feelings such as feeling anxious,  agitated, panicky, irritable, hostile, aggressive, impulsive, severely restless, overly excited and hyperactive, or not being able to sleep. If this happens, especially at the beginning of treatment or after a change in dose, call your health care professional. Avoid alcoholic drinks while taking this medicine. Drinking excessive alcoholic beverages, using sleeping or anxiety medicines, or quickly stopping the use of these agents while taking this medicine may increase your risk for a seizure. Do not drive or use heavy machinery until you know how this medicine affects you. This medicine can impair your ability to perform these tasks. Do not take this medicine close to bedtime. It may prevent you from sleeping. Your mouth may get dry. Chewing sugarless gum or sucking hard candy, and drinking plenty of water may help. Contact your doctor if the problem does not go away or is severe. Do not use nicotine patches or chewing gum without the advice of your doctor or health care professional while taking this medicine. You may need to have your blood pressure taken regularly if your doctor recommends that you use both nicotine and this medicine together. What side effects may I notice from receiving this medicine? Side effects that you should report to your doctor or health care professional as soon as possible: -allergic reactions like skin rash, itching or hives, swelling of the face, lips, or tongue -breathing problems -changes in vision -confusion -fast or irregular heartbeat -hallucinations -increased blood pressure -redness, blistering, peeling or loosening of the skin, including inside the mouth -seizures -suicidal thoughts or other mood changes -unusually weak or tired -vomiting Side effects that usually do not require medical attention (report to your doctor or health care professional if they continue or are bothersome): -change in sex drive or performance -constipation -headache -loss of  appetite -nausea -tremors -weight loss This list may not describe all possible side effects. Call your doctor for medical advice about side effects. You may report side effects to FDA at 1-800-FDA-1088. Where should I keep my medicine? Keep out of the reach of children. Store at room temperature between 20 and 25 degrees C (68 and 77 degrees F). Protect from light. Keep container tightly closed. Throw away any unused medicine after the expiration date. NOTE: This sheet is a summary. It may not cover all possible information. If you have questions about this medicine, talk to your doctor, pharmacist, or health care provider.  2015, Elsevier/Gold Standard. (2012-11-10 10:55:10)  Varenicline oral tablets What is this medicine? VARENICLINE (var EN i kleen) is used to help people quit smoking. It can reduce the symptoms caused by stopping smoking. It is used with a patient support program recommended by your physician. This medicine may be used for other purposes; ask your health care provider or pharmacist if you have questions. COMMON BRAND NAME(S): Chantix What should I tell my health care provider before I take this medicine? They need to know if you have any of these conditions: -bipolar disorder, depression, schizophrenia or other mental  illness -heart disease -if you often drink alcohol -kidney disease -peripheral vascular disease -seizures -stroke -suicidal thoughts, plans, or attempt; a previous suicide attempt by you or a family member -an unusual or allergic reaction to varenicline, other medicines, foods, dyes, or preservatives -pregnant or trying to get pregnant -breast-feeding How should I use this medicine? You should set a date to stop smoking and tell your doctor. Start this medicine one week before the quit date. You can also start taking this medicine before you choose a quit date, and then pick a quit date that is between 8 and 35 days of treatment with this medicine.  Stick to your plan; ask about support groups or other ways to help you remain a 'quitter'. Take this medicine by mouth after eating. Take with a full glass of water. Follow the directions on the prescription label. Take your doses at regular intervals. Do not take your medicine more often than directed. A special MedGuide will be given to you by the pharmacist with each prescription and refill. Be sure to read this information carefully each time. Talk to your pediatrician regarding the use of this medicine in children. This medicine is not approved for use in children. Overdosage: If you think you have taken too much of this medicine contact a poison control center or emergency room at once. NOTE: This medicine is only for you. Do not share this medicine with others. What if I miss a dose? If you miss a dose, take it as soon as you can. If it is almost time for your next dose, take only that dose. Do not take double or extra doses. What may interact with this medicine? -alcohol or any product that contains alcohol -insulin -other stop smoking aids -theophylline -warfarin This list may not describe all possible interactions. Give your health care provider a list of all the medicines, herbs, non-prescription drugs, or dietary supplements you use. Also tell them if you smoke, drink alcohol, or use illegal drugs. Some items may interact with your medicine. What should I watch for while using this medicine? Visit your doctor or health care professional for regular check ups. Ask for ongoing advice and encouragement from your doctor or healthcare professional, friends, and family to help you quit. If you smoke while on this medication, quit again Your mouth may get dry. Chewing sugarless gum or sucking hard candy, and drinking plenty of water may help. Contact your doctor if the problem does not go away or is severe. You may get drowsy or dizzy. Do not drive, use machinery, or do anything that needs  mental alertness until you know how this medicine affects you. Do not stand or sit up quickly, especially if you are an older patient. This reduces the risk of dizzy or fainting spells. The use of this medicine may increase the chance of suicidal thoughts or actions. Pay special attention to how you are responding while on this medicine. Any worsening of mood, or thoughts of suicide or dying should be reported to your health care professional right away. What side effects may I notice from receiving this medicine? Side effects that you should report to your doctor or health care professional as soon as possible: -allergic reactions like skin rash, itching or hives, swelling of the face, lips, tongue, or throat -breathing problems -changes in vision -chest pain or chest tightness -confusion, trouble speaking or understanding -fast, irregular heartbeat -feeling faint or lightheaded, falls -fever -pain in legs when walking -problems with balance, talking,  walking -redness, blistering, peeling or loosening of the skin, including inside the mouth -ringing in ears -seizures -sudden numbness or weakness of the face, arm or leg -suicidal thoughts or other mood changes -trouble passing urine or change in the amount of urine -unusual bleeding or bruising -unusually weak or tired Side effects that usually do not require medical attention (report to your doctor or health care professional if they continue or are bothersome): -constipation -headache -nausea, vomiting -strange dreams -stomach gas -trouble sleeping This list may not describe all possible side effects. Call your doctor for medical advice about side effects. You may report side effects to FDA at 1-800-FDA-1088. Where should I keep my medicine? Keep out of the reach of children. Store at room temperature between 15 and 30 degrees C (59 and 86 degrees F). Throw away any unused medicine after the expiration date. NOTE: This sheet is a  summary. It may not cover all possible information. If you have questions about this medicine, talk to your doctor, pharmacist, or health care provider.  2015, Elsevier/Gold Standard. (2012-12-25 13:37:47)

## 2013-10-18 NOTE — Progress Notes (Signed)
Patient ID: Susan Franco, female   DOB: Jun 07, 1960, 53 y.o.   MRN: [id number] GYNECOLOGY VISIT  PCP:  Cecille Amsterdam, MD [facility])  Referring provider:   HPI: 53 y.o.   Married  Caucasian  female   [phone] with Patient's last menstrual period was 11/15/2011.   here for   AEX. Happy to have had hysterectomy.  Some hot flashes but tolerable.  Prefers to avoid ERT. Vaginal dryness is tolerable.   Husband disabled due to agent orange exposure.   Will do mammogram in [place] at [facility].   Has known lipomas of the anterior left thigh and the anterior abdominal wall (noted after having amniocentesis).  Smoking off and on.  3 cigarettes per day.  Not sure if she wants to quit.   Hgb:    PCP Urine:  neg  GYNECOLOGIC HISTORY: Patient's last menstrual period was 11/15/2011. Sexually active:  no Partner preference: female Contraception:  Tubal/R-TLH  Menopausal hormone therapy: n/a DES exposure:  no  Blood transfusions:  no Sexually transmitted diseases:   no GYN procedures and prior surgeries: Tubal, Diagostic Laparoscopy for ectopic 1985, R-TLH and bilateral salpingectomy 2013  Last mammogram:  2013 normal:[facility]               Last pap and high risk HPV testing:   05/2010 normal History of abnormal pap smear: no    OB History   Grav Para Term Preterm Abortions TAB SAB Ect Mult Living   5 2 2  3  2 1  2        LIFESTYLE: Exercise:    no           Tobacco:   Quit 04/2012 Alcohol:   no Drug use:  no  OTHER HEALTH MAINTENANCE: Tetanus/TDap:  Up to date with PCP Gardisil:            n/a Influenza:          12/2012 Zostavax:           n/a  Bone density:  n/a Colonoscopy:  03/2012 revealed 2 polyps--in [place].  Next colonoscopy due 03/2017.  Cholesterol check: 2014 normal with PCP  Family History  Problem Relation Age of Onset  . Osteoporosis Mother   . Cancer Mother     renal cell, brain  . CAD Father   . CAD Paternal Uncle   . Parkinson's disease  Maternal Grandfather   . Cervical cancer Paternal Grandmother   . CAD Paternal Grandfather     There are no active problems to display for this patient.  Past Medical History  Diagnosis Date  . GERD (gastroesophageal reflux disease)     prilosec  . Seasonal allergies   . Smoker   . History of EKG 08/2011    was reflux-done at [facility] hospital-will request  . Hypertension     atenolol and norvasc    Past Surgical History  Procedure Laterality Date  . Diagnostic laparoscopy  1985    ectopic  . Cystoscopy  11/30/2011    Procedure: CYSTOSCOPY;  Surgeon: Peri Maris, MD;  Location: [facility];  Service: Gynecology;  Laterality: N/A;  . Abdominal hysterectomy      Laparoscopic total hysterectomy for fibroids and bilateral salpingenctomy    ALLERGIES: Review of patient's allergies indicates no known allergies.  Current Outpatient Prescriptions  Medication Sig Dispense Refill  . amLODipine (NORVASC) 5 MG tablet Take 5 mg by mouth daily.      Marland Kitchen atenolol (TENORMIN) 50 MG  tablet Take 50 mg by mouth daily.      Marland Kitchen gabapentin (NEURONTIN) 100 MG capsule       . hydrochlorothiazide (HYDRODIURIL) 25 MG tablet       . omeprazole (PRILOSEC) 40 MG capsule Take 40 mg by mouth daily.      . potassium chloride SA (K-DUR,KLOR-CON) 20 MEQ tablet        No current facility-administered medications for this visit.     ROS:  Pertinent items are noted in HPI.  SOCIAL HISTORY:  Glass blower/designer.  PHYSICAL EXAMINATION:    BP 130/80  Pulse 70  Resp 18  Ht 5\' 11"  (1.803 m)  Wt 198 lb (89.812 kg)  BMI 27.63 kg/m2  LMP 11/15/2011   Wt Readings from Last 3 Encounters:  10/18/13 198 lb (89.812 kg)  10/13/12 195 lb (88.451 kg)  11/30/11 198 lb (89.812 kg)     Ht Readings from Last 3 Encounters:  10/18/13 5\' 11"  (1.803 m)  10/13/12 5' 11.5" (1.816 m)  11/30/11 6' 1.5" (1.867 m)    General appearance: alert, cooperative and appears stated age Head: Normocephalic, without obvious  abnormality, atraumatic Neck: no adenopathy, supple, symmetrical, trachea midline and thyroid not enlarged, symmetric, no tenderness/mass/nodules Lungs: clear to auscultation bilaterally Breasts: Inspection negative, No nipple retraction or dimpling, No nipple discharge or bleeding, No axillary or supraclavicular adenopathy, Normal to palpation without dominant masses Heart: regular rate and rhythm Abdomen: scattered laparoscopic incisions, 3 lumps in the subcutaneous tissue - 1 - 1.5 cm in a cluster - nontender, abdomen soft, non-tender; no organomegaly Extremities: extremities normal, atraumatic, no cyanosis or edema Skin: Skin color, texture, turgor normal. Left anterior thigh with 1.5 cm soft subcutaneous mass.  Skin with scaling raised red areas of the shoulders and chest (states "sun poisoning.") Lymph nodes: Cervical, supraclavicular, and axillary nodes normal. No abnormal inguinal nodes palpated Neurologic: Grossly normal  Pelvic: External genitalia:  no lesions              Urethra:  normal appearing urethra with no masses, tenderness or lesions              Bartholins and Skenes: normal                 Vagina: normal appearing vagina with normal color and discharge, no lesions              Cervix:  absent              Pap and high risk HPV testing done: No..            Bimanual Exam:  Uterus:   absent                                      Adnexa: normal adnexa in size, nontender and no masses                                      Rectovaginal: Confirms                                      Anus:  normal sphincter tone, no lesions  ASSESSMENT  Normal gynecologic exam. Status post robotic total hysterectomy with bilateral salpingectomy.  Tobacco  use.  Skin lesions.   PLAN  Mammogram recommended yearly.  Patient will schedule at [facility].  Pap smear and high risk HPV testing not indicated.  Counseled on self breast exam, Calcium and vitamin D intake, exercise. Discussed smoking  cessation.  Information to patient about Zyban and Chantix.  Dermatology consult recommended.  Patient has dermatologist in [place].  Return annually or prn   An After Visit Summary was printed and given to the patient.

## 2013-12-10 ENCOUNTER — Encounter: Payer: Self-pay | Admitting: Obstetrics and Gynecology

## 2014-01-28 ENCOUNTER — Encounter: Payer: Self-pay | Admitting: Obstetrics and Gynecology

## 2014-10-23 ENCOUNTER — Ambulatory Visit (INDEPENDENT_AMBULATORY_CARE_PROVIDER_SITE_OTHER): Payer: 59 | Admitting: Obstetrics and Gynecology

## 2014-10-23 ENCOUNTER — Encounter: Payer: Self-pay | Admitting: Obstetrics and Gynecology

## 2014-10-23 VITALS — BP 120/84 | HR 64 | Resp 18 | Ht 71.0 in | Wt 202.6 lb

## 2014-10-23 DIAGNOSIS — Z0000 Encounter for general adult medical examination without abnormal findings: Secondary | ICD-10-CM | POA: Diagnosis not present

## 2014-10-23 DIAGNOSIS — Z01419 Encounter for gynecological examination (general) (routine) without abnormal findings: Secondary | ICD-10-CM

## 2014-10-23 LAB — POCT URINALYSIS DIPSTICK
Bilirubin, UA: NEGATIVE
Blood, UA: NEGATIVE
Glucose, UA: NEGATIVE
Ketones, UA: NEGATIVE
Leukocytes, UA: NEGATIVE
Nitrite, UA: NEGATIVE
Protein, UA: NEGATIVE
Urobilinogen, UA: NEGATIVE
pH, UA: 5

## 2014-10-23 NOTE — Progress Notes (Signed)
Patient ID: Susan Franco, female   DOB: 1960/12/22, 54 y.o.   MRN: [id number] 54 y.o. [id number] Married Caucasian female here for annual exam.    Having some hot flashes.  Not too bad.  Husband has health issues.   Skin itching.  Has sores for  4 months.   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  Has a wood burning stove.   PCP: Cecille Amsterdam, MD ([place])    Patient's last menstrual period was 11/15/2011.          Sexually active: No.female --husband 27y.o and hx of agent orange exposure and disabled. The current method of family planning is tubal ligation/Hysterectomy.   Ovaries remain.  Exercising: No.   Smoker:  former  Health Maintenance: Pap:  05/2010 normal History of abnormal Pap:  no MMG:  2013 normal:[facility].  Colonoscopy:  03/2012 BMD:   n/a  Result  n/a TDaP:  PCP Screening Labs:  Hb today: PCP, Urine today: Neg   reports that she quit smoking about 2 years ago. Her smoking use included Cigarettes. She has a 30 pack-year smoking history. She has quit using smokeless tobacco. She reports that she does not drink alcohol or use illicit drugs.  Past Medical History  Diagnosis Date  . GERD (gastroesophageal reflux disease)     prilosec  . Seasonal allergies   . Smoker   . History of EKG 08/2011    was reflux-done at [facility]-will request  . Hypertension     atenolol and norvasc    Past Surgical History  Procedure Laterality Date  . Diagnostic laparoscopy  1985    ectopic  . Cystoscopy  11/30/2011    Procedure: CYSTOSCOPY;  Surgeon: Peri Maris, MD;  Location: [facility];  Service: Gynecology;  Laterality: N/A;  . Abdominal hysterectomy      Laparoscopic total hysterectomy for fibroids and bilateral salpingenctomy    Current Outpatient Prescriptions  Medication Sig Dispense Refill  . amLODipine (NORVASC) 5 MG tablet Take 5 mg by mouth daily.    Marland Kitchen atenolol (TENORMIN) 50 MG tablet Take 50 mg by mouth daily.    . hydrochlorothiazide (HYDRODIURIL) 25 MG  tablet     . omeprazole (PRILOSEC) 40 MG capsule Take 40 mg by mouth daily.     No current facility-administered medications for this visit.    Family History  Problem Relation Age of Onset  . Osteoporosis Mother   . Cancer Mother     renal cell, brain  . CAD Father   . CAD Paternal Uncle   . Parkinson's disease Maternal Grandfather   . Cervical cancer Paternal Grandmother   . CAD Paternal Grandfather     ROS:  Pertinent items are noted in HPI.  Otherwise, a comprehensive ROS was negative.  Exam:   BP 120/84 mmHg  Pulse 64  Resp 18  Ht 5\' 11"  (1.803 m)  Wt 202 lb 9.6 oz (91.899 kg)  BMI 28.27 kg/m2  LMP 11/15/2011    General appearance: alert, cooperative and appears stated age Head: Normocephalic, without obvious abnormality, atraumatic Neck: no adenopathy, supple, symmetrical, trachea midline and thyroid normal to inspection and palpation Lungs: clear to auscultation bilaterally Breasts: normal appearance, no masses or tenderness, Inspection negative, No nipple retraction or dimpling, No nipple discharge or bleeding, No axillary or supraclavicular adenopathy Heart: regular rate and rhythm Abdomen: soft, non-tender; bowel sounds normal; no masses,  no organomegaly Extremities: extremities normal, atraumatic, no cyanosis or edema Skin: Skin color, texture, turgor normal.  Chest and back with red splotching and open sores.  Multiple lipomas of abdominal wall. Left groin, left arm - sizes 5 mm - 3 cm, all nontender. Lymph nodes: Cervical, supraclavicular, and axillary nodes normal. No abnormal inguinal nodes palpated Neurologic: Grossly normal  Pelvic: External genitalia:  no lesions              Urethra:  normal appearing urethra with no masses, tenderness or lesions              Bartholins and Skenes: normal                 Vagina: normal appearing vagina with normal color and discharge, no lesions              Cervix: absent              Pap taken: No. Bimanual Exam:   Uterus:  uterus absent              Adnexa: normal adnexa and no mass, fullness, tenderness              Rectovaginal: Yes.  .  Confirms.              Anus:  normal sphincter tone, no lesions  Chaperone was present for exam.  Assessment:   Well woman visit with normal exam. Status post laparoscopic TLH and bilateral salpingectomy.  Skin lesions.  Multiple lipomas.  Plan: Yearly mammogram recommended after age 36. Strongly encouraged to schedule.  States she will do in [place]. Recommended self breast exam.  Pap and HR HPV as above. Discussed Calcium, Vitamin D, regular exercise program including cardiovascular and weight bearing exercise. Labs performed.  No..     Refills given on medications.  No..    Patient will call her dermatologist in [place] to schedule. Follow up annually and prn.      After visit summary provided.

## 2014-10-23 NOTE — Patient Instructions (Signed)

## 2014-10-24 ENCOUNTER — Ambulatory Visit: Payer: 59 | Admitting: Obstetrics and Gynecology

## 2015-03-30 DIAGNOSIS — M069 Rheumatoid arthritis, unspecified: Secondary | ICD-10-CM

## 2015-03-30 HISTORY — DX: Rheumatoid arthritis, unspecified: M06.9

## 2015-11-21 ENCOUNTER — Ambulatory Visit: Payer: 59 | Admitting: Obstetrics and Gynecology

## 2016-07-07 ENCOUNTER — Other Ambulatory Visit (HOSPITAL_COMMUNITY): Payer: Self-pay | Admitting: Rheumatology

## 2016-07-07 ENCOUNTER — Ambulatory Visit (HOSPITAL_COMMUNITY)
Admission: RE | Admit: 2016-07-07 | Discharge: 2016-07-07 | Disposition: A | Discharge status: Home / Self Care | Payer: 59 | Source: Ambulatory Visit | Attending: Rheumatology | Admitting: Rheumatology

## 2016-07-07 ENCOUNTER — Encounter (INDEPENDENT_AMBULATORY_CARE_PROVIDER_SITE_OTHER): Payer: Self-pay

## 2016-07-07 DIAGNOSIS — M79604 Pain in right leg: Secondary | ICD-10-CM | POA: Insufficient documentation

## 2016-07-07 DIAGNOSIS — M799 Soft tissue disorder, unspecified: Secondary | ICD-10-CM | POA: Diagnosis present

## 2016-07-07 DIAGNOSIS — M7989 Other specified soft tissue disorders: Principal | ICD-10-CM

## 2016-07-07 DIAGNOSIS — R938 Abnormal findings on diagnostic imaging of other specified body structures: Secondary | ICD-10-CM | POA: Insufficient documentation

## 2016-07-07 DIAGNOSIS — M79661 Pain in right lower leg: Secondary | ICD-10-CM

## 2016-07-07 NOTE — Progress Notes (Signed)
**  Preliminary report by tech**  Right lower extremity venous duplex complete. There is no evidence of deep or superficial vein thrombosis involving the right lower extremity. All visualized vessels appear patent and compressible. There is no evidence of a Baker's cyst on the right. Incidental finding of an area of mixed echogenicity in the proximal to mid medial calf that is suggestive of a possible muscle tear. Results were given to [facility] at Dr. Elmon Else office.  07/07/16 10:31 AM Susan Franco RVT

## 2017-04-27 ENCOUNTER — Ambulatory Visit (INDEPENDENT_AMBULATORY_CARE_PROVIDER_SITE_OTHER): Payer: 59 | Admitting: Obstetrics and Gynecology

## 2017-04-27 ENCOUNTER — Encounter: Payer: Self-pay | Admitting: Obstetrics and Gynecology

## 2017-04-27 VITALS — BP 110/72 | HR 70 | Resp 16 | Ht 71.0 in | Wt 232.6 lb

## 2017-04-27 DIAGNOSIS — N951 Menopausal and female climacteric states: Secondary | ICD-10-CM

## 2017-04-27 DIAGNOSIS — Z01419 Encounter for gynecological examination (general) (routine) without abnormal findings: Secondary | ICD-10-CM | POA: Diagnosis not present

## 2017-04-27 MED ORDER — VENLAFAXINE HCL ER 37.5 MG PO CP24: 37.5000 mg | ORAL_CAPSULE | Freq: Every day | ORAL | 1 refills | Status: DC

## 2017-04-27 NOTE — Patient Instructions (Signed)
EXERCISE AND DIET:  We recommended that you start or continue a regular exercise program for good health. Regular exercise means any activity that makes your heart beat faster and makes you sweat.  We recommend exercising at least 30 minutes per day at least 3 days a week, preferably 4 or 5.  We also recommend a diet low in fat and sugar.  Inactivity, poor dietary choices and obesity can cause diabetes, heart attack, stroke, and kidney damage, among others.    ALCOHOL AND SMOKING:  Women should limit their alcohol intake to no more than 7 drinks/beers/glasses of wine (combined, not each!) per week. Moderation of alcohol intake to this level decreases your risk of breast cancer and liver damage. And of course, no recreational drugs are part of a healthy lifestyle.  And absolutely no smoking or even second hand smoke. Most people know smoking can cause heart and lung diseases, but did you know it also contributes to weakening of your bones? Aging of your skin?  Yellowing of your teeth and nails?  CALCIUM AND VITAMIN D:  Adequate intake of calcium and Vitamin D are recommended.  The recommendations for exact amounts of these supplements seem to change often, but generally speaking 600 mg of calcium (either carbonate or citrate) and 800 units of Vitamin D per day seems prudent. Certain women may benefit from higher intake of Vitamin D.  If you are among these women, your doctor will have told you during your visit.    PAP SMEARS:  Pap smears, to check for cervical cancer or precancers,  have traditionally been done yearly, although recent scientific advances have shown that most women can have pap smears less often.  However, every woman still should have a physical exam from her gynecologist every year. It will include a breast check, inspection of the vulva and vagina to check for abnormal growths or skin changes, a visual exam of the cervix, and then an exam to evaluate the size and shape of the uterus and  ovaries.  And after 56 years of age, a rectal exam is indicated to check for rectal cancers. We will also provide age appropriate advice regarding health maintenance, like when you should have certain vaccines, screening for sexually transmitted diseases, bone density testing, colonoscopy, mammograms, etc.   MAMMOGRAMS:  All women over 40 years old should have a yearly mammogram. Many facilities now offer a "3D" mammogram, which may cost around $50 extra out of pocket. If possible,  we recommend you accept the option to have the 3D mammogram performed.  It both reduces the number of women who will be called back for extra views which then turn out to be normal, and it is better than the routine mammogram at detecting truly abnormal areas.    COLONOSCOPY:  Colonoscopy to screen for colon cancer is recommended for all women at age 50.  We know, you hate the idea of the prep.  We agree, BUT, having colon cancer and not knowing it is worse!!  Colon cancer so often starts as a polyp that can be seen and removed at colonscopy, which can quite literally save your life!  And if your first colonoscopy is normal and you have no family history of colon cancer, most women don't have to have it again for 10 years.  Once every ten years, you can do something that may end up saving your life, right?  We will be happy to help you get it scheduled when you are ready.    Be sure to check your insurance coverage so you understand how much it will cost.  It may be covered as a preventative service at no cost, but you should check your particular policy.     Venlafaxine extended-release capsules What is this medicine? VENLAFAXINE(VEN la fax een) is used to treat depression, anxiety and panic disorder. This medicine may be used for other purposes; ask your health care provider or pharmacist if you have questions. COMMON BRAND NAME(S): Effexor XR What should I tell my health care provider before I take this medicine? They need  to know if you have any of these conditions: -bleeding disorders -glaucoma -heart disease -high blood pressure -high cholesterol -kidney disease -liver disease -low levels of sodium in the blood -mania or bipolar disorder -seizures -suicidal thoughts, plans, or attempt; a previous suicide attempt by you or a family -take medicines that treat or prevent blood clots -thyroid disease -an unusual or allergic reaction to venlafaxine, desvenlafaxine, other medicines, foods, dyes, or preservatives -pregnant or trying to get pregnant -breast-feeding How should I use this medicine? Take this medicine by mouth with a full glass of water. Follow the directions on the prescription label. Do not cut, crush, or chew this medicine. Take it with food. If needed, the capsule may be carefully opened and the entire contents sprinkled on a spoonful of cool applesauce. Swallow the applesauce/pellet mixture right away without chewing and follow with a glass of water to ensure complete swallowing of the pellets. Try to take your medicine at about the same time each day. Do not take your medicine more often than directed. Do not stop taking this medicine suddenly except upon the advice of your doctor. Stopping this medicine too quickly may cause serious side effects or your condition may worsen. A special MedGuide will be given to you by the pharmacist with each prescription and refill. Be sure to read this information carefully each time. Talk to your pediatrician regarding the use of this medicine in children. Special care may be needed. Overdosage: If you think you have taken too much of this medicine contact a poison control center or emergency room at once. NOTE: This medicine is only for you. Do not share this medicine with others. What if I miss a dose? If you miss a dose, take it as soon as you can. If it is almost time for your next dose, take only that dose. Do not take double or extra doses. What may  interact with this medicine? Do not take this medicine with any of the following medications: -certain medicines for fungal infections like fluconazole, itraconazole, ketoconazole, posaconazole, voriconazole -cisapride -desvenlafaxine -dofetilide -dronedarone -duloxetine -levomilnacipran -linezolid -MAOIs like Carbex, Eldepryl, Marplan, Nardil, and Parnate -methylene blue (injected into a vein) -milnacipran -pimozide -thioridazine -ziprasidone This medicine may also interact with the following medications: -amphetamines -aspirin and aspirin-like medicines -certain medicines for depression, anxiety, or psychotic disturbances -certain medicines for migraine headaches like almotriptan, eletriptan, frovatriptan, naratriptan, rizatriptan, sumatriptan, zolmitriptan -certain medicines for sleep -certain medicines that treat or prevent blood clots like dalteparin, enoxaparin, warfarin -cimetidine -clozapine -diuretics -fentanyl -furazolidone -indinavir -isoniazid -lithium -metoprolol -NSAIDS, medicines for pain and inflammation, like ibuprofen or naproxen -other medicines that prolong the QT interval (cause an abnormal heart rhythm) -procarbazine -rasagiline -supplements like St. John's wort, kava kava, valerian -tramadol -tryptophan This list may not describe all possible interactions. Give your health care provider a list of all the medicines, herbs, non-prescription drugs, or dietary supplements you use. Also tell them if  you smoke, drink alcohol, or use illegal drugs. Some items may interact with your medicine. What should I watch for while using this medicine? Tell your doctor if your symptoms do not get better or if they get worse. Visit your doctor or health care professional for regular checks on your progress. Because it may take several weeks to see the full effects of this medicine, it is important to continue your treatment as prescribed by your doctor. Patients and  their families should watch out for new or worsening thoughts of suicide or depression. Also watch out for sudden changes in feelings such as feeling anxious, agitated, panicky, irritable, hostile, aggressive, impulsive, severely restless, overly excited and hyperactive, or not being able to sleep. If this happens, especially at the beginning of treatment or after a change in dose, call your health care professional. This medicine can cause an increase in blood pressure. Check with your doctor for instructions on monitoring your blood pressure while taking this medicine. You may get drowsy or dizzy. Do not drive, use machinery, or do anything that needs mental alertness until you know how this medicine affects you. Do not stand or sit up quickly, especially if you are an older patient. This reduces the risk of dizzy or fainting spells. Alcohol may interfere with the effect of this medicine. Avoid alcoholic drinks. Your mouth may get dry. Chewing sugarless gum, sucking hard candy and drinking plenty of water will help. Contact your doctor if the problem does not go away or is severe. What side effects may I notice from receiving this medicine? Side effects that you should report to your doctor or health care professional as soon as possible: -allergic reactions like skin rash, itching or hives, swelling of the face, lips, or tongue -anxious -breathing problems -confusion -changes in vision -chest pain -confusion -elevated mood, decreased need for sleep, racing thoughts, impulsive behavior -eye pain -fast, irregular heartbeat -feeling faint or lightheaded, falls -feeling agitated, angry, or irritable -hallucination, loss of contact with reality -high blood pressure -loss of balance or coordination -palpitations -redness, blistering, peeling or loosening of the skin, including inside the mouth -restlessness, pacing, inability to keep still -seizures -stiff muscles -suicidal thoughts or other  mood changes -trouble passing urine or change in the amount of urine -trouble sleeping -unusual bleeding or bruising -unusually weak or tired -vomiting Side effects that usually do not require medical attention (report to your doctor or health care professional if they continue or are bothersome): -change in sex drive or performance -change in appetite or weight -constipation -dizziness -dry mouth -headache -increased sweating -nausea -tired This list may not describe all possible side effects. Call your doctor for medical advice about side effects. You may report side effects to FDA at 1-800-FDA-1088. Where should I keep my medicine? Keep out of the reach of children. Store at a controlled temperature between 20 and 25 degrees C (68 degrees and 77 degrees F), in a dry place. Throw away any unused medicine after the expiration date. NOTE: This sheet is a summary. It may not cover all possible information. If you have questions about this medicine, talk to your doctor, pharmacist, or health care provider.  2018 Elsevier/Gold Standard (2015-08-14 18:38:02)

## 2017-04-27 NOTE — Progress Notes (Signed)
57 y.o. [id number] Married Caucasian female here for annual exam.    New dx of rheumatoid arthritis.   Having hot flashes, which she states started 3 years ago, worse in the last year.  Feeling miserable.  No prior FSH.  ROS - weight gain, heat/cold intolerance, sinusitis, nose bleeds, muscle/joint pain.  Sinus issues are weather related per patient.  Using a lot of nose spray.  PCP:  Cecille Amsterdam, MD   Patient's last menstrual period was 11/15/2011.           Sexually active: No.  The current method of family planning is tubal ligation and status post hysterectomy.  Husband hx og agent orange exposure and disabled. Exercising: No.   Smoker:  Yes, started back--smokes 1/2 ppd  Health Maintenance: Pap: 2012 normal History of abnormal Pap:  no MMG: 2016/2017 normal at [facility] appt. today Colonoscopy: 03/2012--Has appt.for consult next week BMD:   01/2017  Result : normal with rheumatologist TDaP:  PCP Gardasil:   NA GDJ:MEQAST Hep C: unsure Screening Labs:  Hb today: PCP/Rheumatology    reports that she has been smoking cigarettes.  She has a 15.00 pack-year smoking history. She has quit using smokeless tobacco. She reports that she does not drink alcohol or use drugs.  Past Medical History:  Diagnosis Date  . GERD (gastroesophageal reflux disease)    prilosec  . History of EKG 08/2011   was reflux-done at [facility] hospital-will request  . Hypertension    atenolol and norvasc  . Rheumatoid arthritis ([place]) 2017  . Seasonal allergies   . Smoker     Past Surgical History:  Procedure Laterality Date  . ABDOMINAL HYSTERECTOMY     Laparoscopic total hysterectomy for fibroids and bilateral salpingenctomy  . CYSTOSCOPY  11/30/2011   Procedure: CYSTOSCOPY;  Surgeon: Peri Maris, MD;  Location: [facility];  Service: Gynecology;  Laterality: N/A;  . DIAGNOSTIC LAPAROSCOPY  1985   ectopic    Current Outpatient Medications  Medication Sig Dispense Refill  .  Abatacept (ORENCIA IV) Inject into the vein.    Marland Kitchen amLODipine (NORVASC) 5 MG tablet Take 5 mg by mouth daily.    Marland Kitchen atenolol (TENORMIN) 50 MG tablet Take 50 mg by mouth daily.    . folic acid (FOLVITE) 1 MG tablet Take 1 tablet by mouth daily.    Marland Kitchen lisinopril (PRINIVIL,ZESTRIL) 20 MG tablet Take 1 tablet by mouth daily.    . methotrexate (RHEUMATREX) 10 MG tablet Take by mouth. Takes 4 tablets Sat. & Sun    . omeprazole (PRILOSEC) 40 MG capsule Take 40 mg by mouth daily.    . predniSONE (DELTASONE) 5 MG tablet Take 1 tablet by mouth daily.     No current facility-administered medications for this visit.     Family History  Problem Relation Age of Onset  . Osteoporosis Mother   . Cancer Mother        renal cell, brain  . CAD Father   . CAD Paternal Uncle   . Parkinson's disease Maternal Grandfather   . Cervical cancer Paternal Grandmother   . CAD Paternal Grandfather     ROS:  Pertinent items are noted in HPI.  Otherwise, a comprehensive ROS was negative.  Exam:   BP 110/72 (BP Location: Right Arm, Patient Position: Sitting, Cuff Size: Large)   Pulse 70   Resp 16   Ht 5\' 11"  (1.803 m)   Wt 232 lb 9.6 oz (105.5 kg)   LMP 11/15/2011  BMI 32.44 kg/m     General appearance: alert, cooperative and appears stated age Head: Normocephalic, without obvious abnormality, atraumatic Neck: no adenopathy, supple, symmetrical, trachea midline and thyroid normal to inspection and palpation Lungs: clear to auscultation bilaterally Breasts: normal appearance, no masses or tenderness, No nipple retraction or dimpling, No nipple discharge or bleeding, No axillary or supraclavicular adenopathy Heart: regular rate and rhythm Abdomen: soft, non-tender; no masses, no organomegaly Extremities: extremities normal, atraumatic, no cyanosis or edema Skin:  Skin ulceration on left posterior thigh.  Lipomas of the abdominal wall - 1 cm.  Lymph nodes: Cervical, supraclavicular, and axillary nodes  normal. No abnormal inguinal nodes palpated Neurologic: Grossly normal  Pelvic: External genitalia:  no lesions              Urethra:  normal appearing urethra with no masses, tenderness or lesions              Bartholins and Skenes: normal                 Vagina: normal appearing vagina with normal color and discharge, no lesions              Cervix:  Absent.               Pap taken: No. Bimanual Exam:  Uterus:  absent.               Adnexa: no mass, fullness, tenderness              Rectal exam: Yes.  .  Confirms.              Anus:  normal sphincter tone, no lesions  Chaperone was present for exam.  Assessment:   Well woman visit with normal exam. Status post robotic TLH and bilateral salpingectomy.  Ovaries remain.  Menopausal symptoms. Smoker. Rheumatoid arthritis.   Plan: Mammogram screening discussed. Recommended self breast awareness. Pap and HR HPV as above. Guidelines for Calcium, Vitamin D, regular exercise program including cardiovascular and weight bearing exercise. Will check FSH, estradiol, and TFTs.  We discussed ERT, herbal options, and SSRIs/SNRIs.  Will prescribe Effexor XR 37.5 mg daily.  Sise effects discussed.  She will wait to pick up the Rx until after labs are back.  Approached smoking cessation with patient.  She may follow up through [facility] for counseling.  FU in 6 weeks.  Follow up annually and prn.    After visit summary provided.

## 2017-04-28 LAB — THYROID PANEL WITH TSH
Free Thyroxine Index: 1.9 (ref 1.2–4.9)
T3 Uptake Ratio: 30 % (ref 24–39)
T4, Total: 6.3 ug/dL (ref 4.5–12.0)
TSH: 0.509 u[IU]/mL (ref 0.450–4.500)

## 2017-04-28 LAB — FOLLICLE STIMULATING HORMONE: FSH: 48.8 m[IU]/mL

## 2017-04-28 LAB — ESTRADIOL: Estradiol: 31.7 pg/mL

## 2017-06-08 ENCOUNTER — Encounter: Payer: Self-pay | Admitting: Obstetrics and Gynecology

## 2017-06-08 ENCOUNTER — Ambulatory Visit (INDEPENDENT_AMBULATORY_CARE_PROVIDER_SITE_OTHER): Payer: 59 | Admitting: Obstetrics and Gynecology

## 2017-06-08 VITALS — BP 122/80 | HR 64 | Ht 71.0 in | Wt 231.0 lb

## 2017-06-08 DIAGNOSIS — N951 Menopausal and female climacteric states: Secondary | ICD-10-CM

## 2017-06-08 DIAGNOSIS — F439 Reaction to severe stress, unspecified: Secondary | ICD-10-CM

## 2017-06-08 MED ORDER — VENLAFAXINE HCL ER 37.5 MG PO CP24: 37.5000 mg | ORAL_CAPSULE | Freq: Every day | ORAL | 2 refills | Status: DC

## 2017-06-08 NOTE — Progress Notes (Signed)
GYNECOLOGY  VISIT   HPI: 57 y.o.   Married  Caucasian  female   [phone] with Patient's last menstrual period was 11/15/2011.   here for follow up. Doing well with Effexor.   Had a panic attack just before going on the Effexor. Took Xanax through her PCP.   Taking in the am.   Feeling more calm.  Hot flashes improved.  No night sweats.  No GI upset or sleep issues.   Husband is not well. Agent Orange exposure and liver disease.  GYNECOLOGIC HISTORY: Patient's last menstrual period was 11/15/2011. Contraception:  Hysterectomy Menopausal hormone therapy:  none Last mammogram:  04-27-17 normal [place] of letter with results brought in today. Last pap smear:   2012 normal        OB History    Gravida Para Term Preterm AB Living   5 2 2   3 2    SAB TAB Ectopic Multiple Live Births   2   1             Patient Active Problem List   Diagnosis Date Noted  . Tobacco use disorder 10/18/2013    Past Medical History:  Diagnosis Date  . GERD (gastroesophageal reflux disease)    prilosec  . History of EKG 08/2011   was reflux-done at [facility]-will request  . Hypertension    atenolol and norvasc  . Rheumatoid arthritis ([place]) 2017  . Seasonal allergies   . Smoker     Past Surgical History:  Procedure Laterality Date  . ABDOMINAL HYSTERECTOMY     Laparoscopic total hysterectomy for fibroids and bilateral salpingenctomy  . CYSTOSCOPY  11/30/2011   Procedure: CYSTOSCOPY;  Surgeon: Peri Maris, MD;  Location: [facility];  Service: Gynecology;  Laterality: N/A;  . DIAGNOSTIC LAPAROSCOPY  1985   ectopic    Current Outpatient Medications  Medication Sig Dispense Refill  . Abatacept (ORENCIA IV) Inject into the vein.    Marland Kitchen amLODipine (NORVASC) 5 MG tablet Take 5 mg by mouth daily.    Marland Kitchen atenolol (TENORMIN) 50 MG tablet Take 50 mg by mouth daily.    . folic acid (FOLVITE) 1 MG tablet Take 1 tablet by mouth daily.    Marland Kitchen lisinopril (PRINIVIL,ZESTRIL) 20  MG tablet Take 1 tablet by mouth daily.    . methotrexate (RHEUMATREX) 10 MG tablet Take by mouth. Takes 4 tablets Sat. & Sun    . omeprazole (PRILOSEC) 40 MG capsule Take 40 mg by mouth daily.    . predniSONE (DELTASONE) 5 MG tablet Take 1 tablet by mouth daily.    Marland Kitchen venlafaxine XR (EFFEXOR XR) 37.5 MG 24 hr capsule Take 1 capsule (37.5 mg total) by mouth daily. 90 capsule 2   No current facility-administered medications for this visit.      ALLERGIES: Patient has no known allergies.  Family History  Problem Relation Age of Onset  . Osteoporosis Mother   . Cancer Mother        renal cell, brain  . CAD Father   . CAD Paternal Uncle   . Parkinson's disease Maternal Grandfather   . Cervical cancer Paternal Grandmother   . CAD Paternal Grandfather     Social History   Socioeconomic History  . Marital status: Married    Spouse name: Not on file  . Number of children: Not on file  . Years of education: Not on file  . Highest education level: Not on file  Social Needs  .  Financial resource strain: Not on file  . Food insecurity - worry: Not on file  . Food insecurity - inability: Not on file  . Transportation needs - medical: Not on file  . Transportation needs - non-medical: Not on file  Occupational History  . Not on file  Tobacco Use  . Smoking status: Current Every Day Smoker    Packs/day: 0.50    Years: 30.00    Pack years: 15.00    Types: Cigarettes    Last attempt to quit: 04/29/2012    Years since quitting: 5.1  . Smokeless tobacco: Former Systems developer  . Tobacco comment: smokeless cigarettes  Substance and Sexual Activity  . Alcohol use: No    Alcohol/week: 0.0 oz    Comment: occas  . Drug use: No  . Sexual activity: No    Partners: Male    Birth control/protection: Surgical    Comment: Tubal/R-TLH  Other Topics Concern  . Not on file  Social History Narrative  . Not on file    ROS:  Pertinent items are noted in HPI.  PHYSICAL EXAMINATION:    BP 122/80 (BP  Location: Right Arm, Patient Position: Sitting, Cuff Size: Large)   Pulse 64   Ht 5\' 11"  (1.803 m)   Wt 231 lb (104.8 kg)   LMP 11/15/2011   BMI 32.22 kg/m     General appearance: alert, cooperative and appears stated age.  Laughing and smiling appropriately.  ASSESSMENT  Situational stress.  Menopausal symptoms.  All improved with Effexor.  PLAN  Continue Effexor XR 37.5 mg daily until annual exam due.  Discussed support systems for health care for her husband.  She is aware of these   An After Visit Summary was printed and given to the patient.  _15_____ minutes face to face time of which over 50% was spent in counseling.

## 2017-09-28 ENCOUNTER — Telehealth: Payer: Self-pay | Admitting: Obstetrics and Gynecology

## 2017-09-28 NOTE — Telephone Encounter (Signed)
Message left to return call to Triage Nurse at [phone].    

## 2017-09-28 NOTE — Telephone Encounter (Signed)
[facility] called regarding patient's prescription for Venlafaxine. They have not received prescription.

## 2017-09-28 NOTE — Telephone Encounter (Signed)
Medication refill request: Effexor  Last AEX:  04-27-17 BS Next AEX: 05-03-18  Last MMG (if hormonal medication request): 04-27-17 wnl  Refill authorized: please advise. Medication pended to [facility].

## 2017-09-28 NOTE — Telephone Encounter (Signed)
Patient returned call. Patient requesting prescription for Effexor be sent to [facility] as she is saving a lot of money on her prescription that way. Patient also states that when she last saw Dr. Quincy Simmonds," we discussed upping the medication if needed." Patient states she is currently taking 37.5 mg once a day, but is "still experiencing hot flashes. "They aren't as bad as they were, but I can feel them creeping back up again." RN advised would review with Dr. Quincy Simmonds and return call if any additional recommendations.

## 2017-09-29 NOTE — Telephone Encounter (Signed)
Ok for Effexor 75 mg daily until annual exam is due in Feb. 2020.

## 2017-09-30 MED ORDER — VENLAFAXINE HCL ER 75 MG PO CP24: 75.0000 mg | ORAL_CAPSULE | Freq: Every day | ORAL | 2 refills | Status: DC

## 2017-09-30 NOTE — Telephone Encounter (Signed)
Rx for Effexor 75 mg daily #90 2RF sent to pharmacy on file. Spoke with patient. Advised of dosage change and refills sent until her aex in 04/2018. Patient verbalizes understanding. Encounter closed.

## 2018-03-12 ENCOUNTER — Other Ambulatory Visit: Payer: Self-pay | Admitting: Obstetrics and Gynecology

## 2018-03-29 DIAGNOSIS — L4050 Arthropathic psoriasis, unspecified: Secondary | ICD-10-CM

## 2018-03-29 HISTORY — DX: Arthropathic psoriasis, unspecified: L40.50

## 2018-05-01 NOTE — Progress Notes (Signed)
59 y.o. [id number] Married Caucasian female here for annual exam.    Dealing with rheumatoid arthritis.   Likes taking the Effexor in her current dosage.  She takes Xanax from her PCP.   Husband passed away in 02-20-2018.  He had agent orange exposure.  She is settling his estate and taking care of their home.  Does a lot of physical work.   Some urinary incontinence but does not need help with this.  This is only occasional.   Works at a funeral home.   PCP:  Cecille Amsterdam, MD   Patient's last menstrual period was 11/15/2011.           Sexually active: No.  The current method of family planning is tubal ligation/Hysterectomy.    Exercising: No.  The patient does not participate in regular exercise at present. Smoker:  Yes, 1ppd  Health Maintenance: Pap: 2012 normal History of abnormal Pap:  no MMG: 04-27-17 Neg/dense -- [facility] Colonoscopy:  10/2017 polyps;next 5 years BMD:   01/2017  Result :Normal with rheumatologist TDaP:  PCP.  November 2019.  Gardasil:   no HIV:no Hep C: no Screening Labs:  Hb today: PCP. Flu vaccine:  Done.   reports that she has been smoking cigarettes. She has a 30.00 pack-year smoking history. She has quit using smokeless tobacco. She reports that she does not drink alcohol or use drugs.  Past Medical History:  Diagnosis Date  . GERD (gastroesophageal reflux disease)    prilosec  . History of EKG 08/2011   was reflux-done at [facility] hospital-will request  . Hypertension    atenolol and norvasc  . Rheumatoid arthritis ([place]) 2017  . Seasonal allergies   . Smoker     Past Surgical History:  Procedure Laterality Date  . ABDOMINAL HYSTERECTOMY     Laparoscopic total hysterectomy for fibroids and bilateral salpingenctomy  . CYSTOSCOPY  11/30/2011   Procedure: CYSTOSCOPY;  Surgeon: Peri Maris, MD;  Location: [facility];  Service: Gynecology;  Laterality: N/A;  . DIAGNOSTIC LAPAROSCOPY  1985   ectopic    Current Outpatient  Medications  Medication Sig Dispense Refill  . Abatacept (ORENCIA IV) Inject into the vein.    Marland Kitchen ALPRAZolam (XANAX) 0.25 MG tablet Take 0.25 mg by mouth as needed for anxiety.    Marland Kitchen amLODipine (NORVASC) 5 MG tablet Take 5 mg by mouth daily.    Marland Kitchen atenolol (TENORMIN) 50 MG tablet Take 50 mg by mouth daily.    . folic acid (FOLVITE) 1 MG tablet Take 1 tablet by mouth daily.    Marland Kitchen lisinopril (PRINIVIL,ZESTRIL) 20 MG tablet Take 1 tablet by mouth daily.    . methotrexate (RHEUMATREX) 10 MG tablet Take by mouth. Takes 4 tablets Sat. & Sun    . omeprazole (PRILOSEC) 40 MG capsule Take 40 mg by mouth daily.    . predniSONE (DELTASONE) 5 MG tablet Take 1 tablet by mouth daily.    Marland Kitchen venlafaxine XR (EFFEXOR XR) 75 MG 24 hr capsule Take 1 capsule (75 mg total) by mouth daily with breakfast. 90 capsule 2   No current facility-administered medications for this visit.     Family History  Problem Relation Age of Onset  . Osteoporosis Mother   . Cancer Mother        renal cell, brain  . CAD Father   . CAD Paternal Uncle   . Parkinson's disease Maternal Grandfather   . Cervical cancer Paternal Grandmother   . CAD Paternal  Grandfather     Review of Systems  Genitourinary:       Loss of urine occ. When coughs or sneeze  All other systems reviewed and are negative.   Exam:   BP 138/80 (BP Location: Right Arm, Patient Position: Sitting, Cuff Size: Large)   Pulse 70   Resp 16   Ht 5' 10.5" (1.791 m)   Wt 233 lb (105.7 kg)   LMP 11/15/2011   BMI 32.96 kg/m     General appearance: alert, cooperative and appears stated age Head: Normocephalic, without obvious abnormality, atraumatic Neck: no adenopathy, supple, symmetrical, trachea midline and thyroid normal to inspection and palpation Lungs: clear to auscultation bilaterally Breasts: normal appearance, no masses or tenderness, No nipple retraction or dimpling, No nipple discharge or bleeding, No axillary or supraclavicular adenopathy Heart:  regular rate and rhythm Abdomen: soft, non-tender; no masses, no organomegaly Extremities: extremities normal, atraumatic, no cyanosis or edema Skin: Skin color, texture, turgor normal. No rashes or lesions Lymph nodes: Cervical, supraclavicular, and axillary nodes normal. No abnormal inguinal nodes palpated Neurologic: Grossly normal  Pelvic: External genitalia:  no lesions              Urethra:  normal appearing urethra with no masses, tenderness or lesions              Bartholins and Skenes: normal                 Vagina: normal appearing vagina with normal color and discharge, no lesions              Cervix:  absent              Pap taken: No. Bimanual Exam:  Uterus: absent.  Bm exam limited by [facility].               Adnexa: no mass, fullness, tenderness              Rectal exam: Yes.  .  Confirms.              Anus:  normal sphincter tone, no lesions  Chaperone was present for exam.  Assessment:   Well woman visit with normal exam. Status post robotic TLH and bilateral salpingectomy.  Ovaries remain.  Menopausal symptoms. Life adjustment.  Smoker. Rheumatoid arthritis.  Mild GSI.   Plan: Mammogram screening. Recommended self breast awareness. Pap and HR HPV as above. Guidelines for Calcium, Vitamin D, regular exercise program including cardiovascular and weight bearing exercise. Refill Effexor XR 75 mg daily for one year.  We discussed Hospice for supportive counseling. Discussed smoking cessation briefly.  She is not ready to quit.  Follow up annually and prn.   After visit summary provided.

## 2018-05-03 ENCOUNTER — Other Ambulatory Visit: Payer: Self-pay

## 2018-05-03 ENCOUNTER — Encounter: Payer: Self-pay | Admitting: Obstetrics and Gynecology

## 2018-05-03 ENCOUNTER — Ambulatory Visit (INDEPENDENT_AMBULATORY_CARE_PROVIDER_SITE_OTHER): Payer: 59 | Admitting: Obstetrics and Gynecology

## 2018-05-03 VITALS — BP 138/80 | HR 70 | Resp 16 | Ht 70.5 in | Wt 233.0 lb

## 2018-05-03 DIAGNOSIS — Z01419 Encounter for gynecological examination (general) (routine) without abnormal findings: Secondary | ICD-10-CM

## 2018-05-03 MED ORDER — VENLAFAXINE HCL ER 75 MG PO CP24: 75.0000 mg | ORAL_CAPSULE | Freq: Every day | ORAL | 3 refills | Status: DC

## 2018-05-03 NOTE — Patient Instructions (Signed)

## 2018-11-20 ENCOUNTER — Emergency Department (HOSPITAL_COMMUNITY): Payer: 59

## 2018-11-20 ENCOUNTER — Emergency Department (HOSPITAL_COMMUNITY)
Admission: EM | Admit: 2018-11-20 | Discharge: 2018-11-20 | Disposition: A | Discharge status: Home / Self Care | Payer: 59 | Attending: Emergency Medicine | Admitting: Emergency Medicine

## 2018-11-20 DIAGNOSIS — I10 Essential (primary) hypertension: Secondary | ICD-10-CM | POA: Insufficient documentation

## 2018-11-20 DIAGNOSIS — Z79899 Other long term (current) drug therapy: Secondary | ICD-10-CM | POA: Diagnosis not present

## 2018-11-20 DIAGNOSIS — Z20828 Contact with and (suspected) exposure to other viral communicable diseases: Secondary | ICD-10-CM | POA: Insufficient documentation

## 2018-11-20 DIAGNOSIS — F17210 Nicotine dependence, cigarettes, uncomplicated: Secondary | ICD-10-CM | POA: Diagnosis not present

## 2018-11-20 DIAGNOSIS — R05 Cough: Secondary | ICD-10-CM | POA: Insufficient documentation

## 2018-11-20 DIAGNOSIS — R062 Wheezing: Secondary | ICD-10-CM | POA: Diagnosis not present

## 2018-11-20 DIAGNOSIS — R0602 Shortness of breath: Secondary | ICD-10-CM | POA: Insufficient documentation

## 2018-11-20 LAB — TROPONIN I (HIGH SENSITIVITY)
Troponin I (High Sensitivity): 11 ng/L (ref <18)
Troponin I (High Sensitivity): 10 ng/L (ref <18)

## 2018-11-20 LAB — CBC
HCT: 39.7 % (ref 36.0–46.0)
Hemoglobin: 13.6 g/dL (ref 12.0–15.0)
MCH: 33.5 pg (ref 26.0–34.0)
MCHC: 34.3 g/dL (ref 30.0–36.0)
MCV: 97.8 fL (ref 80.0–100.0)
Platelets: 148 10*3/uL — ABNORMAL LOW (ref 150–400)
RBC: 4.06 MIL/uL (ref 3.87–5.11)
RDW: 14 % (ref 11.5–15.5)
WBC: 8.3 10*3/uL (ref 4.0–10.5)
nRBC: 0 % (ref 0.0–0.2)

## 2018-11-20 LAB — BASIC METABOLIC PANEL
Anion gap: 12 (ref 5–15)
BUN: 8 mg/dL (ref 6–20)
CO2: 22 mmol/L (ref 22–32)
Calcium: 10 mg/dL (ref 8.9–10.3)
Chloride: 95 mmol/L — ABNORMAL LOW (ref 98–111)
Creatinine, Ser: 0.73 mg/dL (ref 0.44–1.00)
GFR calc Af Amer: >60 mL/min (ref >60)
GFR calc non Af Amer: >60 mL/min (ref >60)
Glucose, Bld: 89 mg/dL (ref 70–99)
Potassium: 3.9 mmol/L (ref 3.5–5.1)
Sodium: 129 mmol/L — ABNORMAL LOW (ref 135–145)

## 2018-11-20 LAB — D-DIMER, QUANTITATIVE: D-Dimer, Quant: <0.27 ug/mL-FEU (ref 0.00–0.50)

## 2018-11-20 LAB — BRAIN NATRIURETIC PEPTIDE: B Natriuretic Peptide: 14.5 pg/mL (ref 0.0–100.0)

## 2018-11-20 IMAGING — DX CHEST - 2 VIEW
2 series · 2 of 2 positions shown · non-contrast
Comparison: None.

CLINICAL DATA: Chest pain and shortness of breath

EXAM:
CHEST - 2 VIEW

[chest pa]
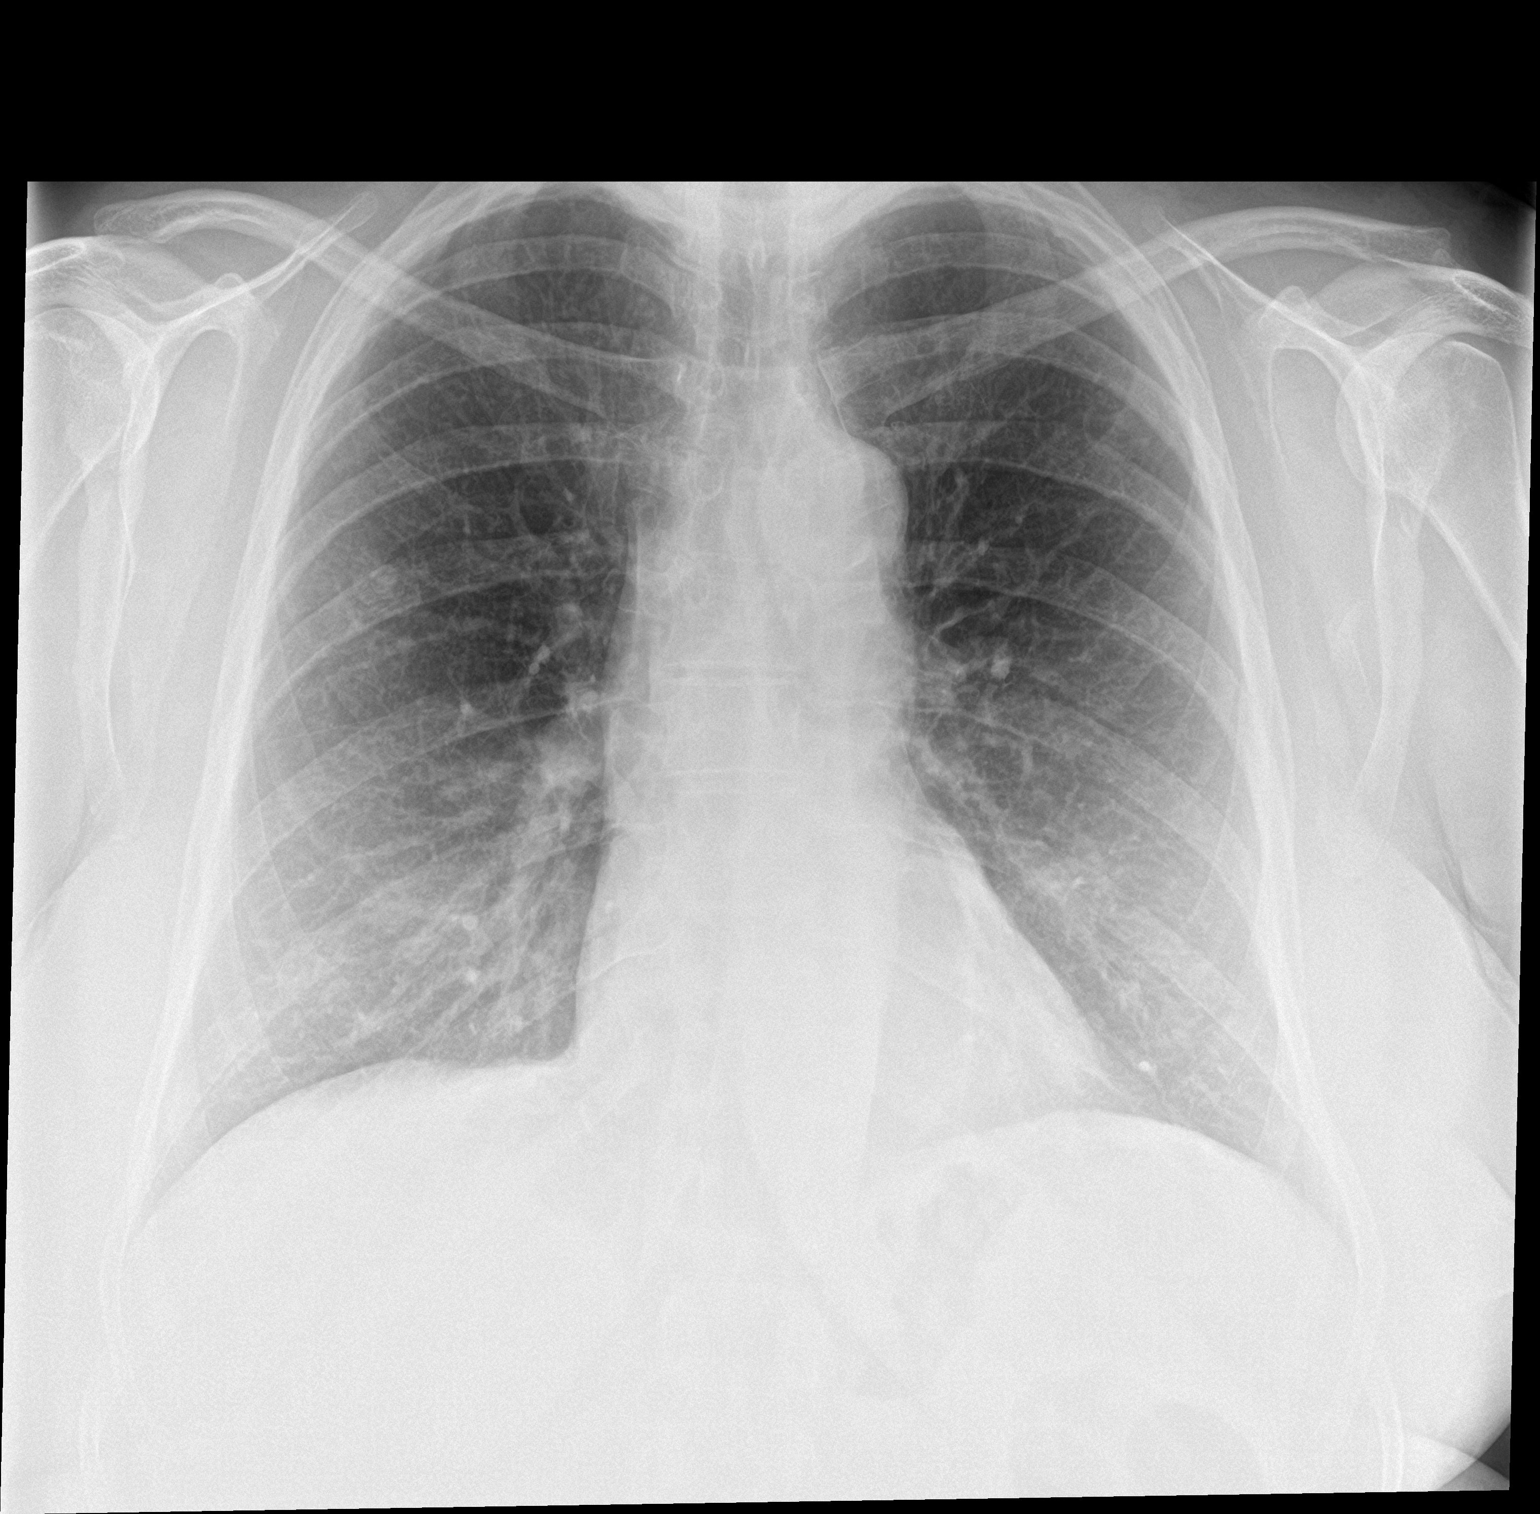

[chest lat]
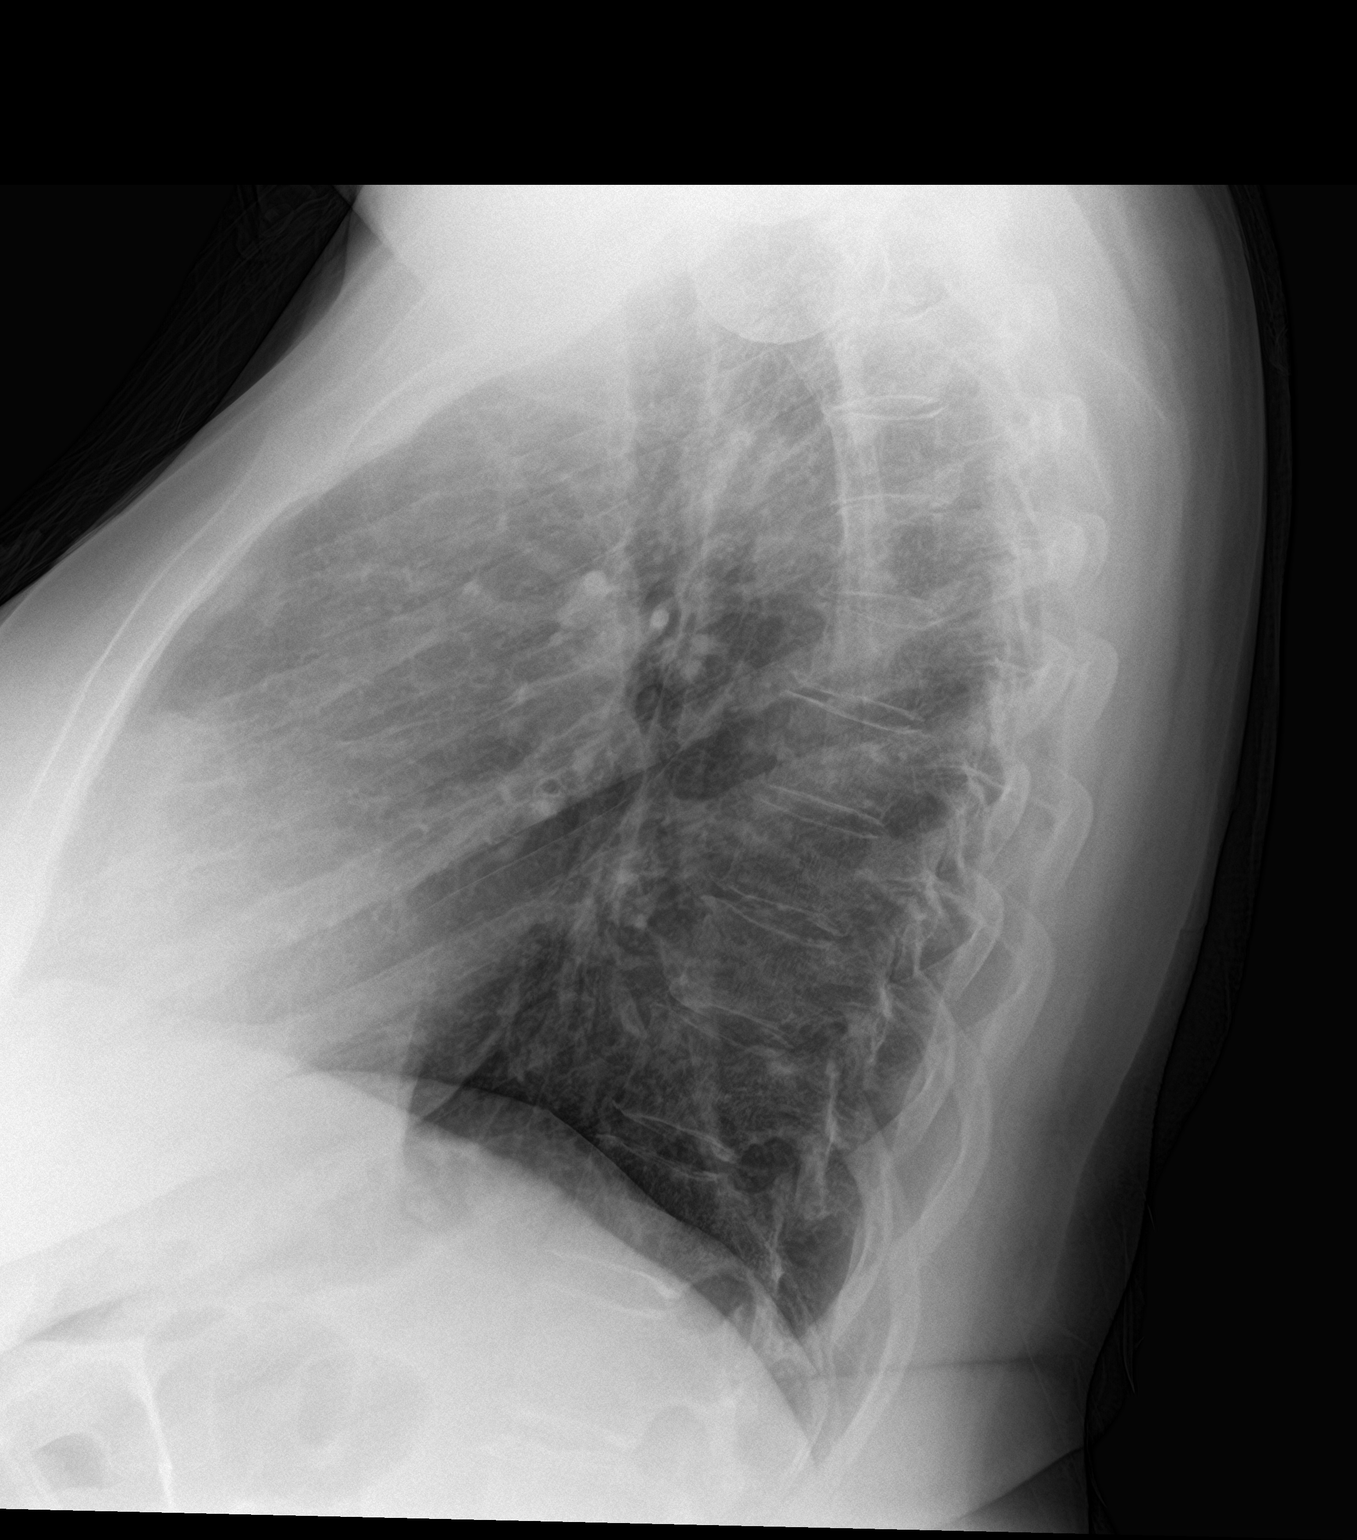

[2 of 2 positions shown; findings below may reference images not displayed]

FINDINGS: There is an apparent small granuloma in the right upper lobe. Lungs
elsewhere are clear. Heart size and pulmonary vascularity are
normal. No adenopathy. No pneumothorax. No bone lesions.
IMPRESSION: No edema or consolidation.  Small granuloma right upper lobe.

## 2018-11-20 MED ORDER — SODIUM CHLORIDE 0.9% FLUSH: 3.0000 mL | Freq: Once | INTRAVENOUS | Status: DC

## 2018-11-20 MED ORDER — ALBUTEROL SULFATE HFA 108 (90 BASE) MCG/ACT IN AERS
4.0000 | INHALATION_SPRAY | Freq: Once | RESPIRATORY_TRACT | Status: AC
  Administered 2018-11-20: 4 via RESPIRATORY_TRACT
  Filled 2018-11-20: qty 6.7

## 2018-11-20 NOTE — ED Triage Notes (Signed)
Patient reports shortness of breath worse on exertion, "rattle in chest," non-productive cough, and intermittent L-sided chest pain x 3 days. Denies fevers/chills. Denies chest pain today. Resp e/u at rest, skin w/d.

## 2018-11-20 NOTE — Discharge Instructions (Addendum)
Your laboratory results were within normal limits today.  Please use your inhaler while at home.  Please make an appointment with your primary care physician in order to further evaluate your shortness of breath along with your dry cough.

## 2018-11-20 NOTE — ED Provider Notes (Signed)
[facility] EMERGENCY DEPARTMENT Provider Note   CSN: [id number] Arrival date & time: 11/20/18  1019     History   Chief Complaint Chief Complaint  Patient presents with  . Shortness of Breath    HPI Susan Franco is a 58 y.o. female.     58 y.o female with a PMH of GERD, RA, HTN presents to the ED with a chief complaint of shortness of breath.  Patient reports she is been having shortness of breath along with cold sweats for the past 3 days.  She states the shortness of breath is worse with activity but does feel it at rest.  She has tried using her albuterol inhaler without improvement in symptoms.  She also endorses a dry cough which sometimes is productive with yellow sputum.  Patient was placed on steroid therapy by her rheumatologist, states she has been on steroids for the past 5 to 6 weeks.  She also continues to smoke cigarettes.  She denies any chest pain, fevers, sick contacts, previous history of blood clots.  The history is provided by the patient and medical records.  Shortness of Breath Associated symptoms: cough and wheezing   Associated symptoms: no abdominal pain, no chest pain, no fever, no headaches, no sore throat and no vomiting     Past Medical History:  Diagnosis Date  . GERD (gastroesophageal reflux disease)    prilosec  . History of EKG 08/2011   was reflux-done at [facility] hospital-will request  . Hypertension    atenolol and norvasc  . Rheumatoid arthritis ([place]) 2017  . Seasonal allergies   . Smoker     Patient Active Problem List   Diagnosis Date Noted  . Tobacco use disorder 10/18/2013    Past Surgical History:  Procedure Laterality Date  . ABDOMINAL HYSTERECTOMY     Laparoscopic total hysterectomy for fibroids and bilateral salpingenctomy  . CYSTOSCOPY  11/30/2011   Procedure: CYSTOSCOPY;  Surgeon: Peri Maris, MD;  Location: [facility];  Service: Gynecology;  Laterality: N/A;  . DIAGNOSTIC LAPAROSCOPY  1985   ectopic     OB History    Gravida  5   Para  2   Term  2   Preterm      AB  3   Living  2     SAB  2   TAB      Ectopic  1   Multiple      Live Births               Home Medications    Prior to Admission medications   Medication Sig Start Date End Date Taking? Authorizing Provider  Abatacept (ORENCIA IV) Inject into the vein.    [provider]  ALPRAZolam Duanne Moron) 0.25 MG tablet Take 0.25 mg by mouth as needed for anxiety.    [provider]  amLODipine (NORVASC) 5 MG tablet Take 5 mg by mouth daily.    [provider]  atenolol (TENORMIN) 50 MG tablet Take 50 mg by mouth daily.    [provider]  folic acid (FOLVITE) 1 MG tablet Take 1 tablet by mouth daily.    [provider]  lisinopril (PRINIVIL,ZESTRIL) 20 MG tablet Take 1 tablet by mouth daily. 01/20/17   [provider]  methotrexate (RHEUMATREX) 10 MG tablet Take by mouth. Takes 4 tablets Sat. & Sun    [provider]  omeprazole (PRILOSEC) 40 MG capsule Take 40 mg by mouth  daily.    [provider]  predniSONE (DELTASONE) 5 MG tablet Take 1 tablet by mouth daily. 01/10/17   [provider]  venlafaxine XR (EFFEXOR XR) 75 MG 24 hr capsule Take 1 capsule (75 mg total) by mouth daily with breakfast. 05/03/18   Nunzio Cobbs, MD    Family History Family History  Problem Relation Age of Onset  . Osteoporosis Mother   . Cancer Mother        renal cell, brain  . CAD Father   . CAD Paternal Uncle   . Parkinson's disease Maternal Grandfather   . Cervical cancer Paternal Grandmother   . CAD Paternal Grandfather     Social History Social History   Tobacco Use  . Smoking status: Current Every Day Smoker    Packs/day: 1.00    Years: 30.00    Pack years: 30.00    Types: Cigarettes    Last attempt to quit: 04/29/2012    Years since quitting: 6.5  . Smokeless tobacco: Former Systems developer  . Tobacco comment: smokeless  cigarettes  Substance Use Topics  . Alcohol use: No    Alcohol/week: 0.0 standard drinks    Comment: occas  . Drug use: No     Allergies   Patient has no known allergies.   Review of Systems Review of Systems  Constitutional: Positive for chills. Negative for fever.  HENT: Negative for sore throat.   Eyes: Negative for redness.  Respiratory: Positive for cough, shortness of breath and wheezing. Negative for chest tightness.   Cardiovascular: Negative for chest pain.  Gastrointestinal: Negative for abdominal pain, diarrhea, nausea and vomiting.  Genitourinary: Negative for flank pain.  Musculoskeletal: Negative for back pain.  Skin: Negative for pallor and wound.  Neurological: Negative for syncope, light-headedness and headaches.     Physical Exam Updated Vital Signs BP 127/88   Pulse 70   Temp 99.4 F (37.4 C) (Oral)   Resp 17   LMP 11/15/2011   SpO2 99%   Physical Exam Vitals signs and nursing note reviewed.  Constitutional:      General: She is not in acute distress.    Appearance: She is well-developed.  HENT:     Head: Normocephalic and atraumatic.     Mouth/Throat:     Pharynx: No oropharyngeal exudate.  Eyes:     Pupils: Pupils are equal, round, and reactive to light.  Neck:     Musculoskeletal: Normal range of motion.  Cardiovascular:     Rate and Rhythm: Regular rhythm.     Heart sounds: Normal heart sounds.     Comments: No bilateral leg pitting edema. Pulmonary:     Effort: Pulmonary effort is normal. No respiratory distress.     Breath sounds: Examination of the left-middle field reveals decreased breath sounds. Examination of the left-lower field reveals decreased breath sounds. Decreased breath sounds and wheezing present. No rhonchi or rales.     Comments: Inspiratory wheezing left > right. Chest:     Chest wall: No tenderness.     Comments: No tenderness with palpation of the chest. Abdominal:     General: Bowel sounds are normal. There  is no distension.     Palpations: Abdomen is soft.     Tenderness: There is no abdominal tenderness.  Musculoskeletal:        General: No tenderness or deformity.     Right shoulder: She exhibits no tenderness, no swelling, no laceration, no pain, no spasm, normal pulse  and normal strength.     Right lower leg: No edema.     Left lower leg: No edema.     Comments: No tenderness with palpation of bilateral calf region.  Skin:    General: Skin is warm and dry.  Neurological:     Mental Status: She is alert and oriented to person, place, and time.      ED Treatments / Results  Labs (all labs ordered are listed, but only abnormal results are displayed) Labs Reviewed  BASIC METABOLIC PANEL - Abnormal; Notable for the following components:      Result Value   Sodium 129 (*)    Chloride 95 (*)    All other components within normal limits  CBC - Abnormal; Notable for the following components:   Platelets 148 (*)    All other components within normal limits  NOVEL CORONAVIRUS, NAA (HOSPITAL ORDER, SEND-OUT TO REF LAB)  BRAIN NATRIURETIC PEPTIDE  D-DIMER, QUANTITATIVE (NOT AT [facility])  TROPONIN I (HIGH SENSITIVITY)  TROPONIN I (HIGH SENSITIVITY)    EKG EKG Interpretation  Date/Time:  Monday November 20 2018 10:27:52 EDT Ventricular Rate:  73 PR Interval:  166 QRS Duration: 88 QT Interval:  370 QTC Calculation: 407 R Axis:   46 Text Interpretation:  Normal sinus rhythm Normal ECG No significant change since last tracing Confirmed by Quintella Reichert [id number]) on 11/20/2018 10:55:18 AM   Radiology Dg Chest 2 View  Result Date: 11/20/2018 CLINICAL DATA:  Chest pain and shortness of breath EXAM: CHEST - 2 VIEW COMPARISON:  None. FINDINGS: There is an apparent small granuloma in the right upper lobe. Lungs elsewhere are clear. Heart size and pulmonary vascularity are normal. No adenopathy. No pneumothorax. No bone lesions. IMPRESSION: No edema or consolidation.  Small granuloma right  upper lobe. Electronically Signed   By: Lowella Grip III M.D.   On: 11/20/2018 10:55    Procedures Procedures (including critical care time)  Medications Ordered in ED Medications  sodium chloride flush (NS) 0.9 % injection 3 mL (has no administration in time range)  albuterol (VENTOLIN HFA) 108 (90 Base) MCG/ACT inhaler 4 puff (4 puffs Inhalation Given 11/20/18 1220)     Initial Impression / Assessment and Plan / ED Course  I have reviewed the triage vital signs and the nursing notes.  Pertinent labs & imaging results that were available during my care of the patient were reviewed by me and considered in my medical decision making (see chart for details).    Patient with a past medical history of RA presents the ED with increasing shortness of breath, x3 days.  She reports she has had increased shortness of breath along with wheezing, has used her albuterol inhaler without improvement in symptoms, this was last used yesterday.  She also reports a dry cough at baseline, she is a pack-a-day cigarette smoker. Patient saw her rheumatologist about 5 weeks ago, states she was placed on steroid therapy for the past 5 weeks, this has not helped with her shortness of breath and wheezing.   BMP show hyponatremia, potassium level is within normal limits, creatinine level is normal.  CBC showed no leukocytosis, hemoglobin is normal.  BNP obtained, does not have a previous history of heart failure this was normal today.  Troponins were negative x2.  A d-dimer was obtained as patient reported some shortness of breath although no hypoxia or tachycardia on my evaluation.  This was negative.  A COVID-19 swab was obtained and sent out.  A  chest x-ray showed: No edema or consolidation. Small granuloma right upper lobe.  Patient received 4 puffs of albuterol while in the ED, she reports improvement in symptoms, this did help her wheezing which is now under control.  She does report having a albuterol  inhaler at home, reported she will need to continue using this.  I advised that she likely will need to stop smoking along with follow-up with PCP for further diagnostic of her chronic cough.  Low suspicion for ACS, presentation this not seem consistent with this.  Some suspicion the patient will likely has COPD.  Encouraged to follow-up with PCP.  Afebrile with stable vital signs, patient stable for discharge.  Return precautions provided at length.    Portions of this note were generated with [employer]. Dictation errors may occur despite best attempts at proofreading.  Final Clinical Impressions(s) / ED Diagnoses   Final diagnoses:  Shortness of breath  Wheezing    ED Discharge Orders    None       Janeece Fitting, Hershal Coria 11/20/18 1451    Quintella Reichert, MD 11/21/18 [phone]

## 2018-11-22 LAB — NOVEL CORONAVIRUS, NAA (HOSP ORDER, SEND-OUT TO REF LAB; TAT 18-24 HRS): SARS-CoV-2, NAA: NOT DETECTED

## 2019-05-08 ENCOUNTER — Other Ambulatory Visit: Payer: Self-pay

## 2019-05-08 NOTE — Progress Notes (Signed)
59 y.o. [id number] Widowed Caucasian female here for annual exam.    She has psoriatic arthritis.  She is getting help at [facility] and doing better.   Taking Venlafaxine for menopausal symptoms.  Off of her prednisone, she has less heat.  She does have some stress, anxiety, and worry.   Patient complaining of urinary incontinence. Having leakage with coughing, sneeze, or getting out of a chair.  She is wearing pads.  No leakage spontaneously.  Voiding every 60 - 90 minutes due to urge.  Can have leakage related to urge.  She uses bed protection.  No dysuria or hematuria.  Sometimes has problems passing stool.  No splinting.  No fecal incontinence.  No bulge felt vaginally.   She drinks Mt. Dew, 3 per day.   She states she gained 100 pounds over the last 4 years.   She has done her Covid vaccines.    PCP: Cecille Amsterdam, MD    Patient's last menstrual period was 11/15/2011.           Sexually active: No.  The current method of family planning is tubal ligation/Hysterectomy.    Exercising: No.  The patient does not participate in regular exercise at present. Smoker:  Yes, smokes 1ppd  Health Maintenance: Pap: 2012 normal History of abnormal Pap:  no MMG: 04-27-17 Normal at [facility] knows needs to schedule Colonoscopy:  10/2017 polyps;next 2024 BMD:   01/2017  Result :normal with rheumatologist TDaP: PCP---01/2018 Gardasil:   no HIV:no Hep C:no Screening Labs:  PCP.  Flu vaccine:  Completed.   reports that she has been smoking cigarettes. She has a 30.00 pack-year smoking history. She has quit using smokeless tobacco. She reports current alcohol use of about 2.0 standard drinks of alcohol per week. She reports that she does not use drugs.  Past Medical History:  Diagnosis Date  . GERD (gastroesophageal reflux disease)    prilosec  . History of EKG 08/2011   was reflux-done at [facility] hospital-will request  . Hypertension    atenolol and norvasc  .  Psoriatic arthritis ([place]) 2020   [place]  . Rheumatoid arthritis ([place]) 2017  . Seasonal allergies   . Smoker     Past Surgical History:  Procedure Laterality Date  . ABDOMINAL HYSTERECTOMY     Laparoscopic total hysterectomy for fibroids and bilateral salpingenctomy  . CYSTOSCOPY  11/30/2011   Procedure: CYSTOSCOPY;  Surgeon: Peri Maris, MD;  Location: [facility];  Service: Gynecology;  Laterality: N/A;  . DIAGNOSTIC LAPAROSCOPY  1985   ectopic    Current Outpatient Medications  Medication Sig Dispense Refill  . ALPRAZolam (XANAX) 0.25 MG tablet Take 0.25 mg by mouth as needed for anxiety.    Marland Kitchen amLODipine (NORVASC) 5 MG tablet Take 5 mg by mouth daily.    Marland Kitchen atenolol (TENORMIN) 50 MG tablet Take 50 mg by mouth daily.    . folic acid (FOLVITE) 1 MG tablet Take 1 tablet by mouth daily.    Marland Kitchen lisinopril (PRINIVIL,ZESTRIL) 20 MG tablet Take 1 tablet by mouth daily.    . methotrexate (RHEUMATREX) 2.5 MG tablet Take 6 tabs (15 mg ) once a week, 12 weeks    . mupirocin ointment (BACTROBAN) 2 %     . naproxen (NAPROSYN) 500 MG tablet TAKE 1 TABLET BY MOUTH  TWICE DAILY WITH FOOD AS  NEEDED    . omeprazole (PRILOSEC) 40 MG capsule Take 40 mg by mouth daily.    . potassium chloride SA (  KLOR-CON) 20 MEQ tablet Take 1 tablet by mouth daily.    Marland Kitchen spironolactone (ALDACTONE) 25 MG tablet Take 1 tablet by mouth 2 (two) times daily.    Marland Kitchen sulfamethoxazole-trimethoprim (BACTRIM DS) 800-160 MG tablet Take 1 tablet by mouth 2 (two) times daily.    . ustekinumab (STELARA) 90 MG/ML SOSY injection     . venlafaxine XR (EFFEXOR XR) 75 MG 24 hr capsule Take 1 capsule (75 mg total) by mouth daily with breakfast. 90 capsule 3   No current facility-administered medications for this visit.    Family History  Problem Relation Age of Onset  . Osteoporosis Mother   . Cancer Mother        renal cell, brain  . CAD Father   . CAD Paternal Uncle   . Parkinson's disease Maternal Grandfather   . Cervical cancer  Paternal Grandmother   . CAD Paternal Grandfather     Review of Systems  Genitourinary:       Urinary incontinence  All other systems reviewed and are negative.   Exam:   BP 138/86 (Cuff Size: Large)   Pulse 80   Temp (!) 97.3 F (36.3 C) (Temporal)   Resp 16   Ht 5' 10.5" (1.791 m)   Wt 244 lb 12.8 oz (111 kg)   LMP 11/15/2011   BMI 34.63 kg/m     General appearance: alert, cooperative and appears stated age Head: normocephalic, without obvious abnormality, atraumatic Neck: no adenopathy, supple, symmetrical, trachea midline and thyroid normal to inspection and palpation Lungs: clear to auscultation bilaterally Breasts: normal appearance, no masses or tenderness, No nipple retraction or dimpling, No nipple discharge or bleeding, No axillary adenopathy Heart: regular rate and rhythm Abdomen: soft, non-tender; no masses, no organomegaly Extremities: extremities normal, atraumatic, no cyanosis or edema Skin: skin color, texture, turgor normal. No rashes or lesions Lymph nodes: cervical, supraclavicular, and axillary nodes normal. Neurologic: grossly normal  Pelvic: External genitalia:  no lesions              No abnormal inguinal nodes palpated.              Urethra:  normal appearing urethra with no masses, tenderness or lesions              Bartholins and Skenes: normal                 Vagina: normal appearing vagina with normal color and discharge, no lesions.  Good support.               Cervix: absent              Pap taken: No. Bimanual Exam:  Uterus:  Absent.              Adnexa: no mass, fullness, tenderness              Rectal exam: Yes.  .  Confirms.              Anus:  normal sphincter tone, no lesions  Chaperone was present for exam.  Assessment:   Well woman visit with normal exam. Status post robotic TLH and bilateral salpingectomy. Ovaries remain.  Smoker. Rheumatoid arthritis. Mixed incontinence.   Plan: Mammogram screening discussed. Self  breast awareness reviewed. Pap and HR HPV as above. Guidelines for Calcium, Vitamin D, regular exercise program including cardiovascular and weight bearing exercise. We discussed her incontinence and options for care.  ACOG HO on incontinence.  We discussed  bladder irritants.  She declines PT.  No pessary at this time.  I recommend weight loss prior to any surgical care.  Refill of Effexor XR.  Declines smoking cessation.  Follow up annually and prn.   After visit summary provided.

## 2019-05-09 ENCOUNTER — Encounter: Payer: Self-pay | Admitting: Obstetrics and Gynecology

## 2019-05-09 ENCOUNTER — Ambulatory Visit (INDEPENDENT_AMBULATORY_CARE_PROVIDER_SITE_OTHER): Payer: 59 | Admitting: Obstetrics and Gynecology

## 2019-05-09 VITALS — BP 138/86 | HR 80 | Temp 97.3°F | Resp 16 | Ht 70.5 in | Wt 244.8 lb

## 2019-05-09 DIAGNOSIS — Z01419 Encounter for gynecological examination (general) (routine) without abnormal findings: Secondary | ICD-10-CM

## 2019-05-09 MED ORDER — VENLAFAXINE HCL ER 75 MG PO CP24: 75.0000 mg | ORAL_CAPSULE | Freq: Every day | ORAL | 3 refills | Status: DC

## 2019-05-09 NOTE — Patient Instructions (Signed)

## 2019-09-03 ENCOUNTER — Other Ambulatory Visit: Payer: Self-pay

## 2019-09-03 ENCOUNTER — Other Ambulatory Visit: Payer: Self-pay | Admitting: Internal Medicine

## 2019-09-03 ENCOUNTER — Ambulatory Visit
Admission: RE | Admit: 2019-09-03 | Discharge: 2019-09-03 | Disposition: A | Discharge status: Home / Self Care | Payer: 59 | Source: Ambulatory Visit | Attending: Internal Medicine | Admitting: Internal Medicine

## 2019-09-03 DIAGNOSIS — M79605 Pain in left leg: Secondary | ICD-10-CM | POA: Diagnosis present

## 2019-09-03 DIAGNOSIS — M79604 Pain in right leg: Secondary | ICD-10-CM

## 2019-09-03 IMAGING — US US EXTREM LOW VENOUS
1 series · 13 of 24 positions shown · non-contrast
Comparison: None.

CLINICAL DATA: Bilateral lower extremity pain and edema. History of
smoking. Evaluate for DVT.



[Series 1: us venous img lower bilat (dvt) · portal-venous · 13 of 48 slices shown]
[im 1/48]
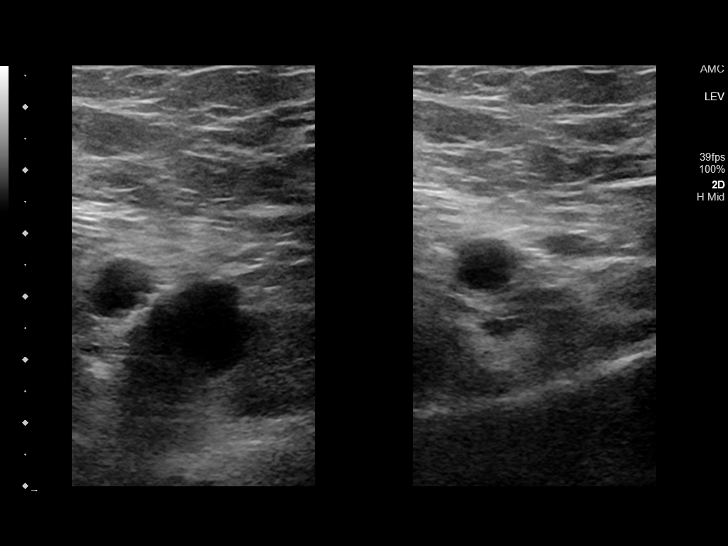
[im 5/48]
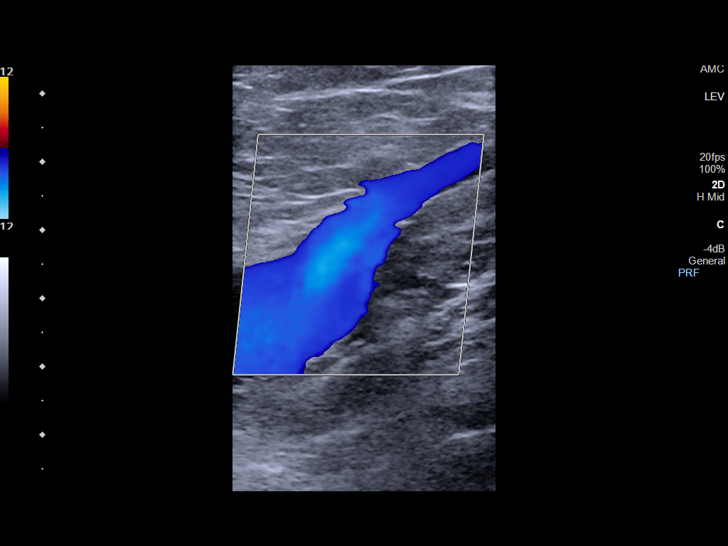
[im 9/48]
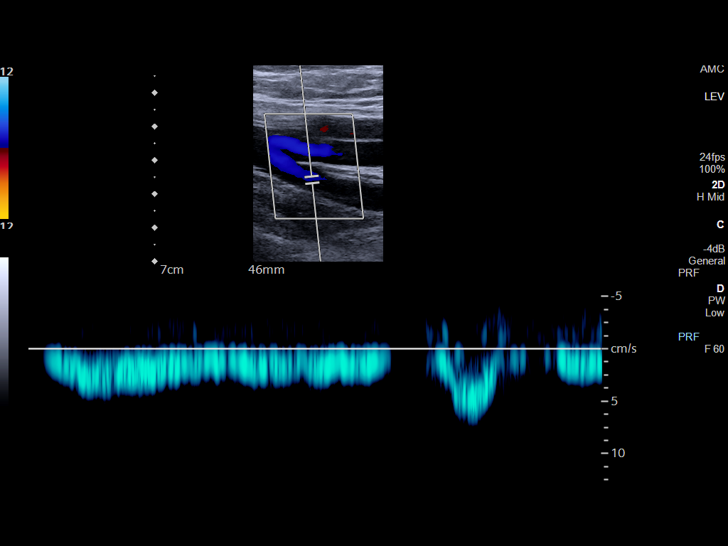
[im 13/48]
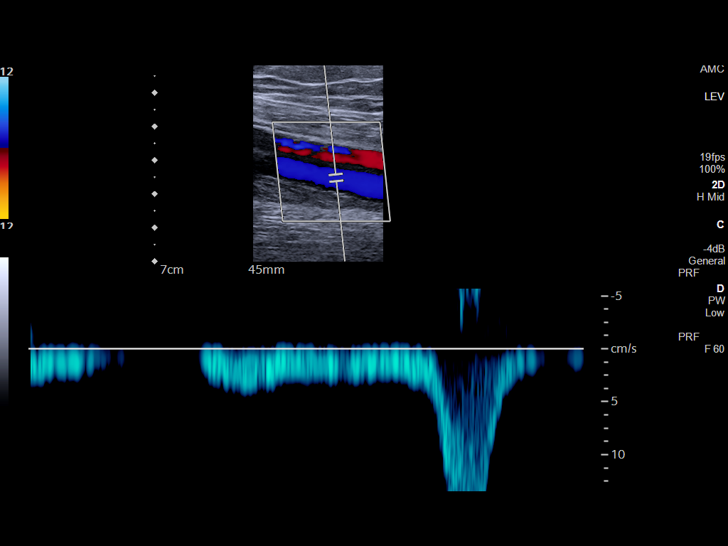
[im 17/48]
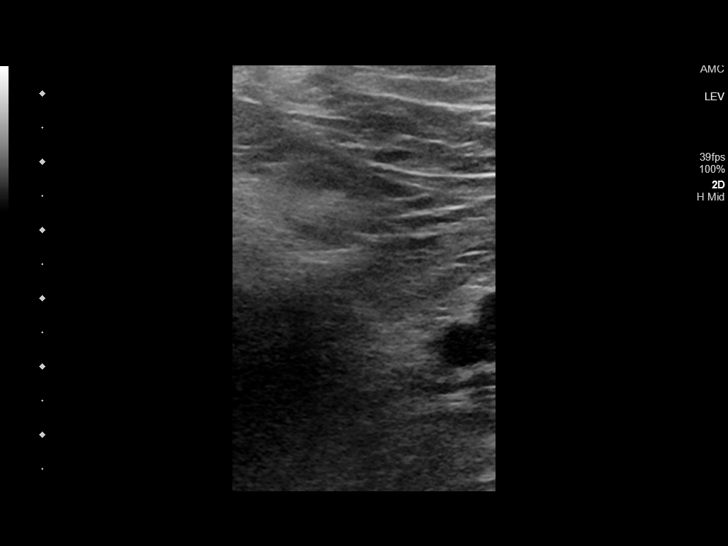
[im 21/48]
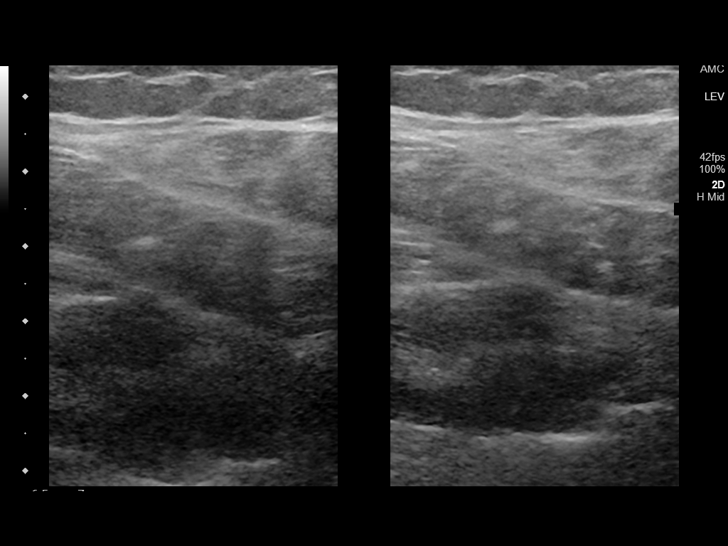
[im 25/48]
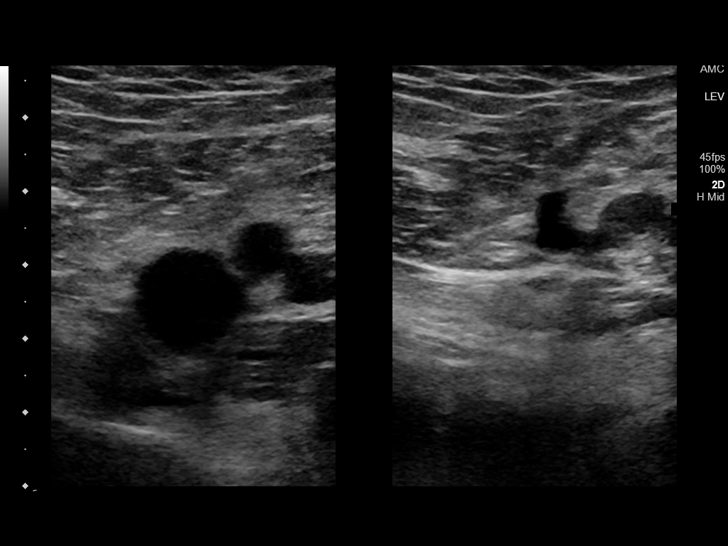
[im 27/48]
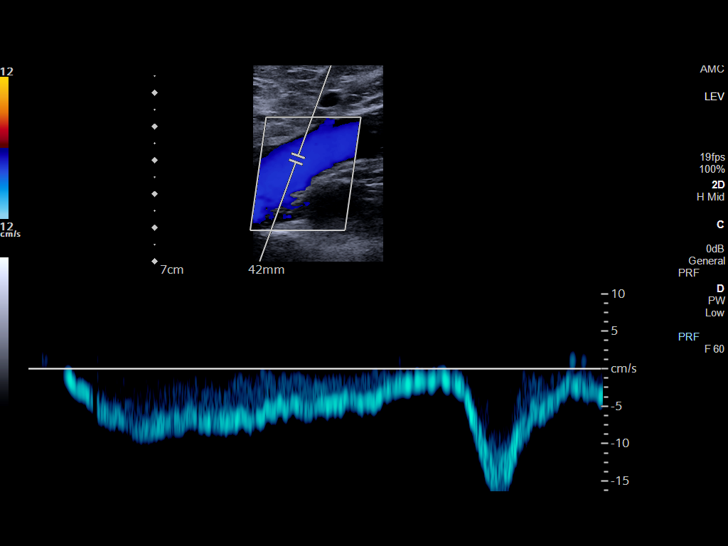
[im 31/48]
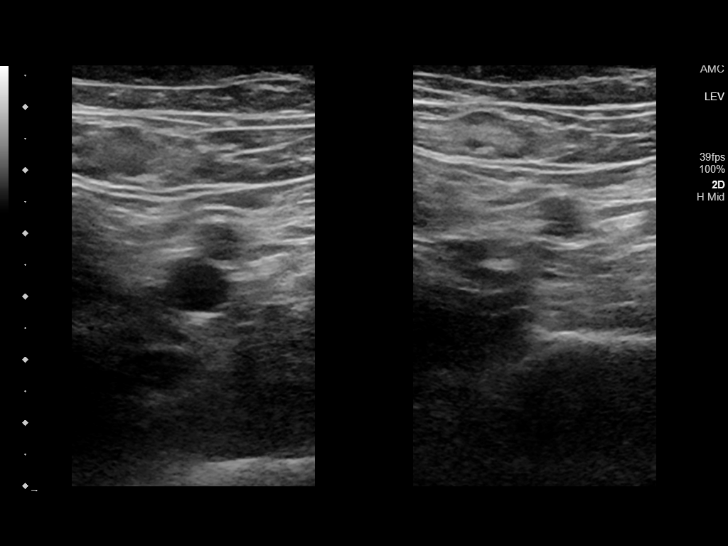
[im 35/48]
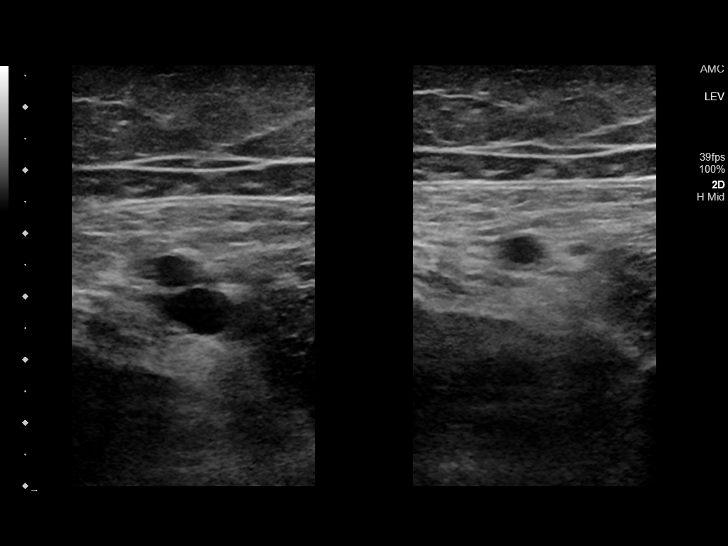
[im 39/48]
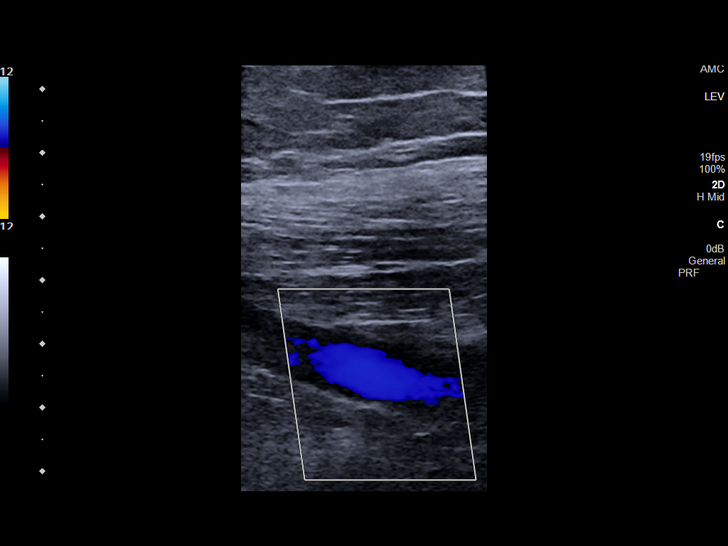
[im 43/48]
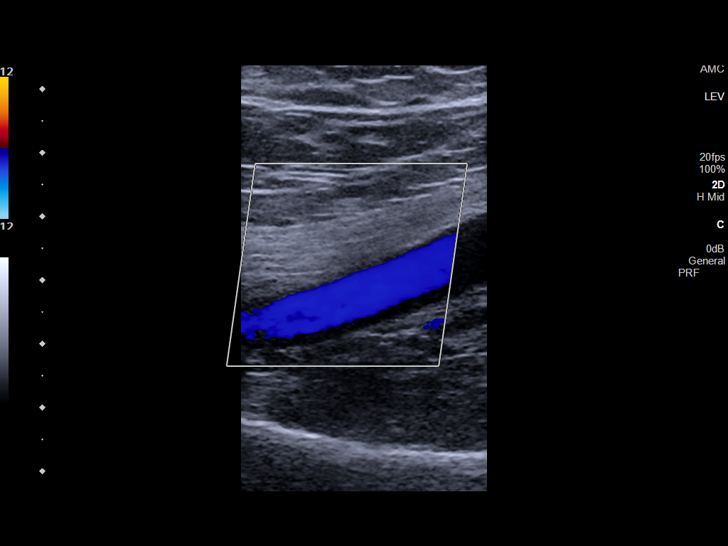
[im 48/48]
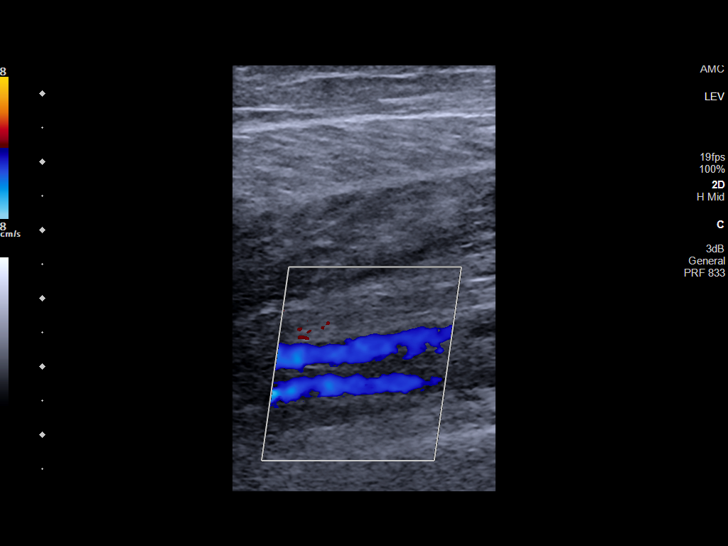

[13 of 24 positions shown; findings below may reference images not displayed]

FINDINGS: RIGHT LOWER EXTREMITY

Common Femoral Vein: No evidence of thrombus. Normal
compressibility, respiratory phasicity and response to augmentation.

Saphenofemoral Junction: No evidence of thrombus. Normal
compressibility and flow on color Doppler imaging.

Profunda Femoral Vein: No evidence of thrombus. Normal
compressibility and flow on color Doppler imaging.

Femoral Vein: No evidence of thrombus. Normal compressibility,
respiratory phasicity and response to augmentation.

Popliteal Vein: No evidence of thrombus. Normal compressibility,
respiratory phasicity and response to augmentation.

Calf Veins: No evidence of thrombus. Normal compressibility and flow
on color Doppler imaging.

Superficial Great Saphenous Vein: No evidence of thrombus. Normal
compressibility.

Venous Reflux:  None.

Other Findings:  None.

LEFT LOWER EXTREMITY

Common Femoral Vein: No evidence of thrombus. Normal
compressibility, respiratory phasicity and response to augmentation.

Saphenofemoral Junction: No evidence of thrombus. Normal
compressibility and flow on color Doppler imaging.

Profunda Femoral Vein: No evidence of thrombus. Normal
compressibility and flow on color Doppler imaging.

Femoral Vein: No evidence of thrombus. Normal compressibility,
respiratory phasicity and response to augmentation.

Popliteal Vein: No evidence of thrombus. Normal compressibility,
respiratory phasicity and response to augmentation.

Calf Veins: No evidence of thrombus. Normal compressibility and flow
on color Doppler imaging.

Superficial Great Saphenous Vein: No evidence of thrombus. Normal
compressibility.

Venous Reflux:  None.

Other Findings:  None.
IMPRESSION: No evidence of DVT within either lower extremity.

## 2020-06-09 ENCOUNTER — Other Ambulatory Visit: Payer: Self-pay | Admitting: Neurosurgery

## 2020-06-09 DIAGNOSIS — M4156 Other secondary scoliosis, lumbar region: Secondary | ICD-10-CM

## 2020-06-11 ENCOUNTER — Ambulatory Visit: Admitting: Obstetrics and Gynecology

## 2020-06-28 ENCOUNTER — Other Ambulatory Visit: Payer: Self-pay

## 2020-06-28 ENCOUNTER — Ambulatory Visit
Admission: RE | Admit: 2020-06-28 | Discharge: 2020-06-28 | Disposition: A | Discharge status: Home / Self Care | Payer: 59 | Source: Ambulatory Visit | Attending: Neurosurgery | Admitting: Neurosurgery

## 2020-06-28 DIAGNOSIS — M4156 Other secondary scoliosis, lumbar region: Secondary | ICD-10-CM

## 2020-06-28 IMAGING — MR MR LUMBAR SPINE W/O CM
4 of 5 series · 18 of 48 positions shown · non-contrast
Comparison: None.

CLINICAL DATA: Low back pain extending the right lower extremity
for 6 months with progression.

EXAM:
MRI LUMBAR SPINE WITHOUT CONTRAST
TECHNIQUE: Multiplanar, multisequence MR imaging of the lumbar spine was
performed. No intravenous contrast was administered.

[Series 5: T2 · sagittal · 4.0mm · 0.73mm/px · 6 of 18 slices shown (1 of 2)]
[im 1/18]
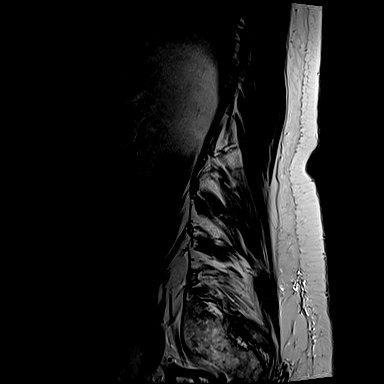
[im 4/18]
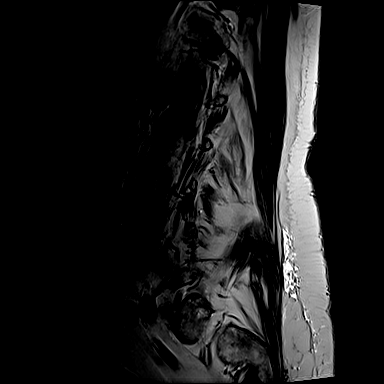
[im 7/18]
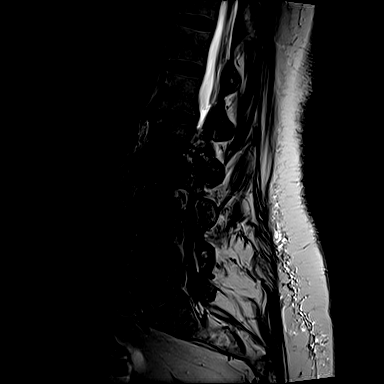
[im 11/18]
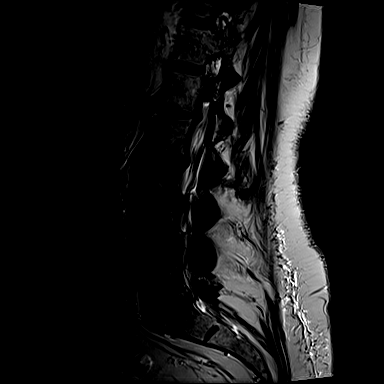
[im 14/18]
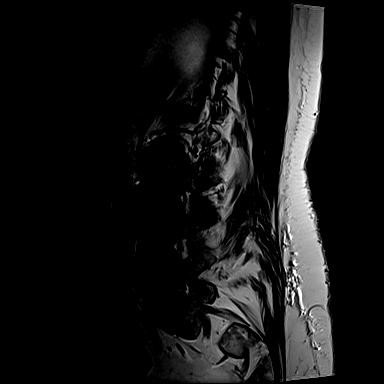
[im 18/18]
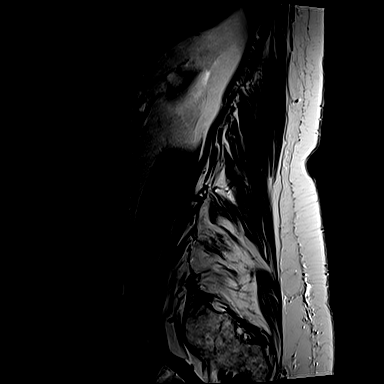

[Series 6: T1 · sagittal · 4.0mm · 0.73mm/px · 3 of 18 slices shown (1 of 2)]
[im 4/18]
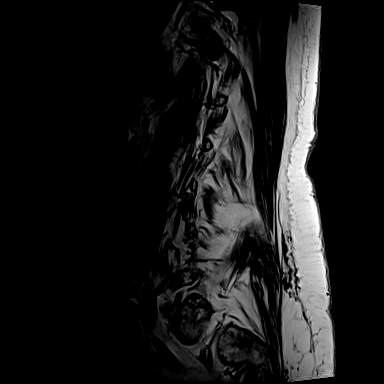
[im 11/18]
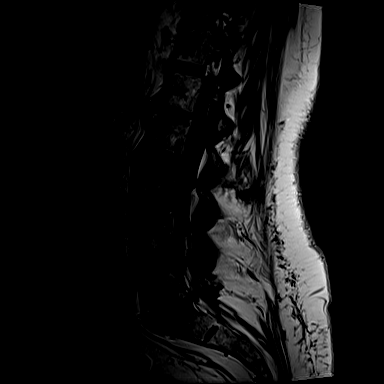
[im 18/18]
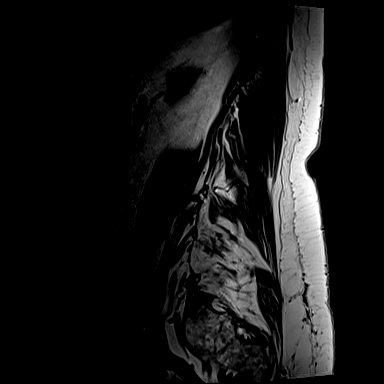

[Series 12: T2 · axial · 4.0mm · 0.28mm/px · z∈[-94,+121]mm · 6 of 48 slices shown (2 of 2)]
[im 1/48]
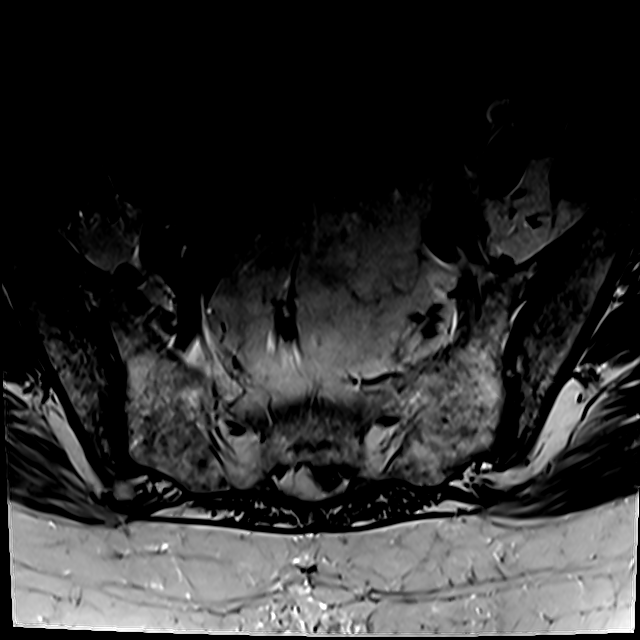
[im 7/48]
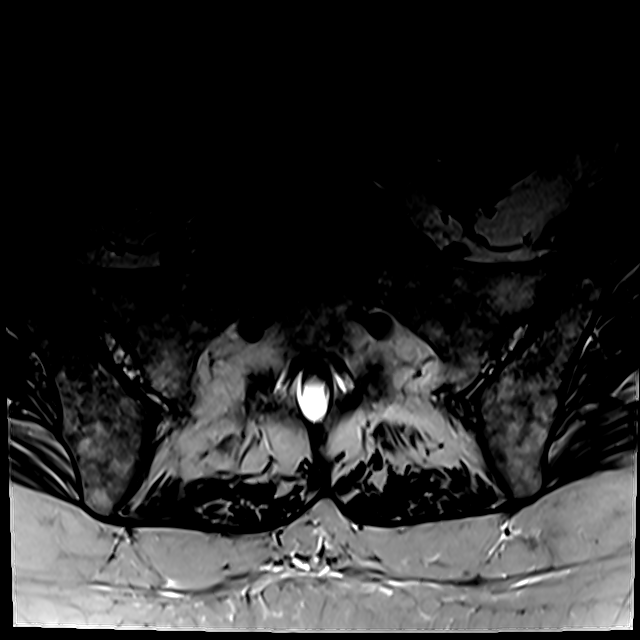
[im 14/48]
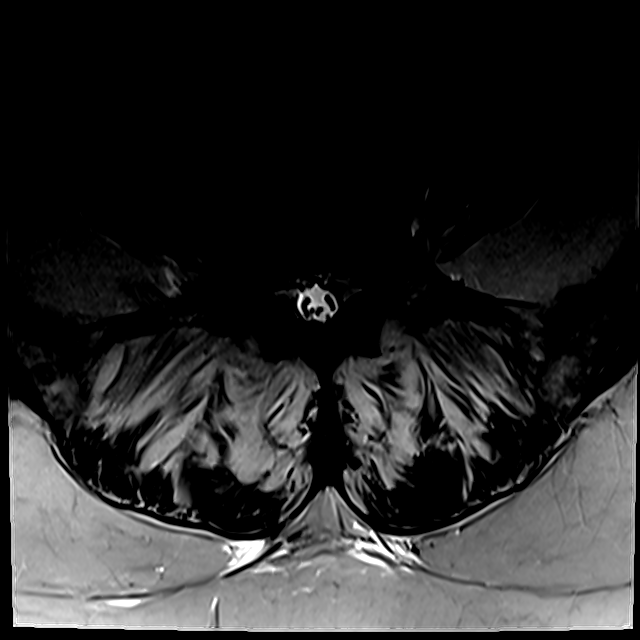
[im 21/48]
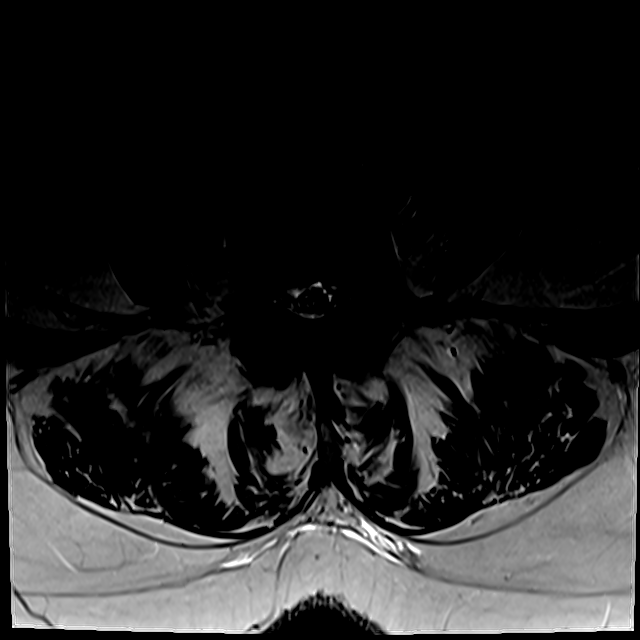
[im 24/48]
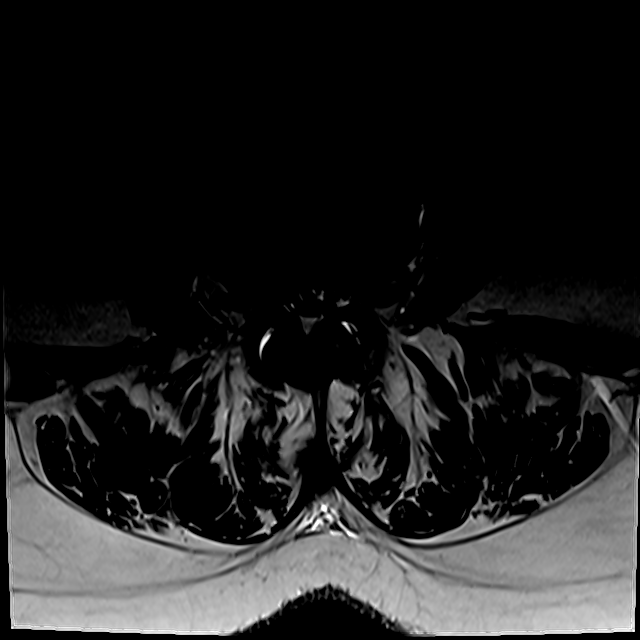
[im 41/48]
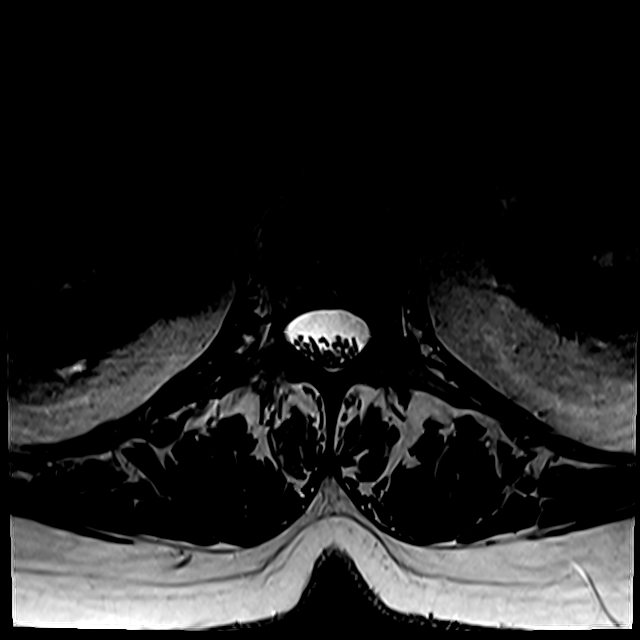

[Series 100: T1 · axial · 4.0mm · 0.28mm/px · z∈[-64,+121]mm · 3 of 48 slices shown (2 of 2)]
[im 7/48]
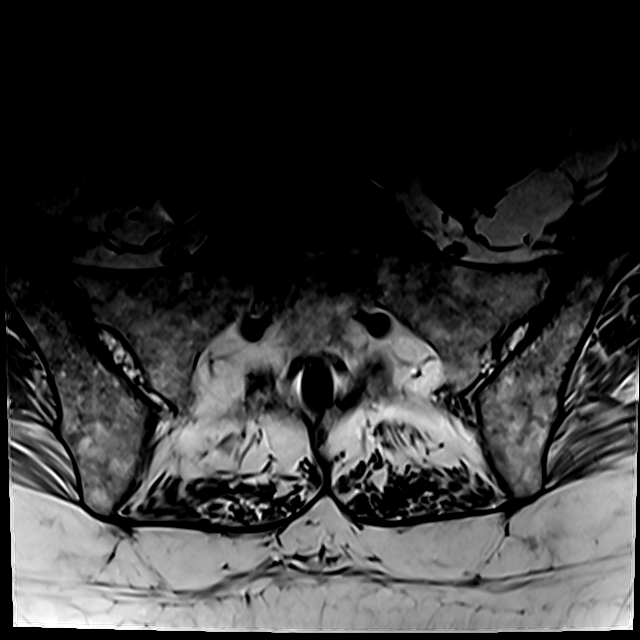
[im 24/48]
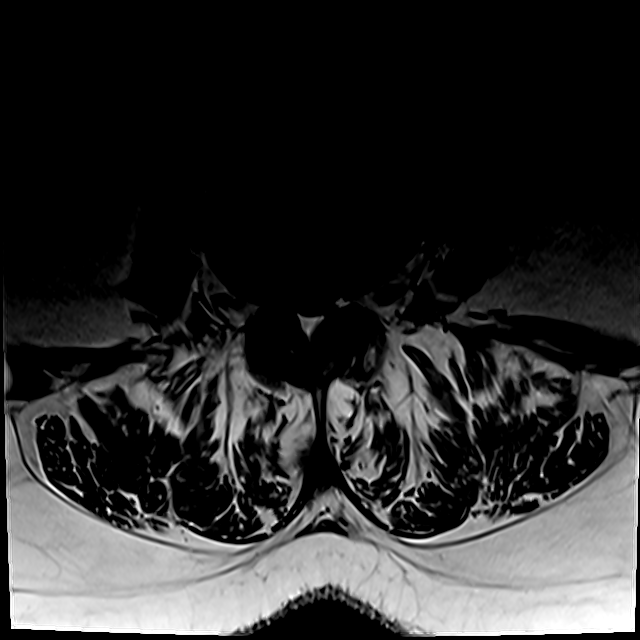
[im 41/48]
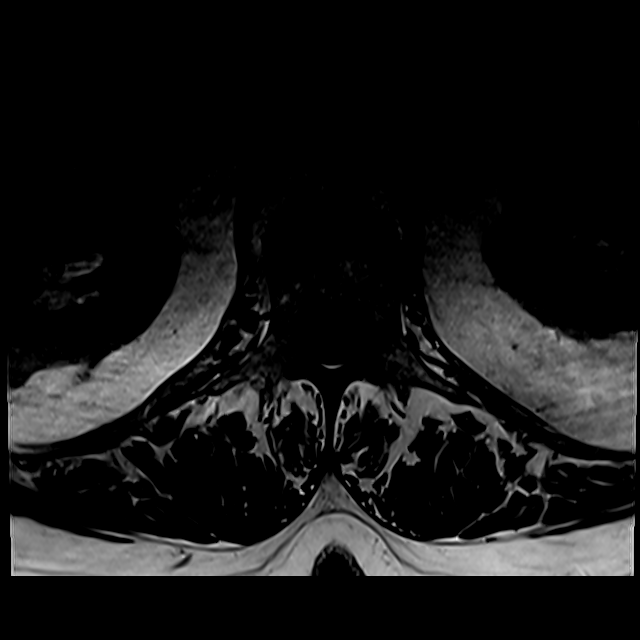

[18 of 48 positions shown; findings below may reference images not displayed]

FINDINGS: Segmentation: 5 non rib-bearing lumbar type vertebral bodies are
present. The lowest fully formed vertebral body is L5.

Alignment: No significant listhesis is present. Straightening of the
normal lumbar lordosis is noted.

Vertebrae: Edematous endplate changes are present at L2-3 on the
left. Marrow signal and vertebral body heights are otherwise normal.

Conus medullaris and cauda equina: Conus extends to the L1 level.
Conus and cauda equina appear normal.

Paraspinal and other soft tissues: 13 mm cyst present near the upper
pole of the right kidney. Ill-defined soft tissue density displaces
the left psoas muscle at L2-3. Paraspinous soft tissues are
otherwise within normal limits. No significant adenopathy is
present.

Disc levels:

L1-2: Mild disc bulging and facet hypertrophy is noted bilaterally
without significant stenosis.

L2-3: Leftward disc protrusion present. Edema is present in the left
facet joint. Moderate left and mild right foraminal narrowing is
present. Mild left subarticular narrowing is present.

L3-4: A broad-based disc protrusion is present. Advanced facet
hypertrophy is noted. Prominent epidural fat is noted. This results
in severe central canal stenosis. Moderate right and mild left
foraminal narrowing is present.

L4-5: A broad-based disc protrusion is present. Advanced facet
hypertrophy noted. There is crowding of the central nerve roots.
Severe central canal stenosis is present.

L5-S1: Moderate facet hypertrophy is present bilaterally. Chronic
loss of disc height noted. Central canal is patent. Mild foraminal
narrowing is evident bilaterally.
IMPRESSION: 1. Abnormal edema in the endplates on the left at L2-3 with
heterogeneous soft tissue adjacent to the disc space displacing the
left psoas muscle. Findings are concerning for early disc
osteomyelitis. Recommend MRI of the lumbar spine with contrast to
confirm.
2. Severe central canal stenosis at L3-4 and L4-5 secondary to a
broad-based disc protrusion and advanced facet hypertrophy.
3. Moderate right and mild left foraminal narrowing at L3-4.
4. Moderate left and mild right foraminal narrowing at L2-3.
5. Mild left subarticular narrowing at L2-3.

These results will be called to the ordering clinician or
representative by the Radiologist Assistant, and communication
documented in the PACS or [REDACTED].

## 2020-07-08 ENCOUNTER — Other Ambulatory Visit: Payer: Self-pay | Admitting: Neurosurgery

## 2020-07-08 DIAGNOSIS — M4646 Discitis, unspecified, lumbar region: Secondary | ICD-10-CM

## 2020-07-23 ENCOUNTER — Ambulatory Visit
Admission: RE | Admit: 2020-07-23 | Discharge: 2020-07-23 | Disposition: A | Discharge status: Home / Self Care | Source: Ambulatory Visit | Attending: Neurosurgery | Admitting: Neurosurgery

## 2020-07-23 DIAGNOSIS — M4646 Discitis, unspecified, lumbar region: Secondary | ICD-10-CM

## 2020-07-23 IMAGING — MR MR LUMBAR SPINE W/ CM
2 series · 23 of 48 positions shown · IV contrast (20 ml multihance)
Comparison: MRI of the lumbar spine [DATE].

CLINICAL DATA: Lumbar discitis.

EXAM:
MRI LUMBAR SPINE WITH CONTRAST
CONTRAST:  20mL MULTIHANCE GADOBENATE DIMEGLUMINE 529 MG/ML IV SOLN

[Series 5: T1 fat-sat post-contrast · sagittal · 4.0mm · 0.73mm/px · 12 of 15 slices shown]
[im 1/15]
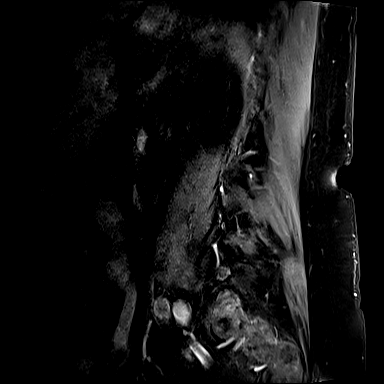
[im 2/15]
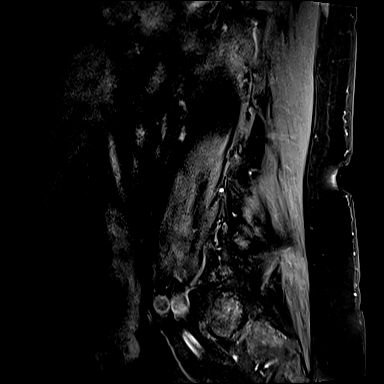
[im 3/15]
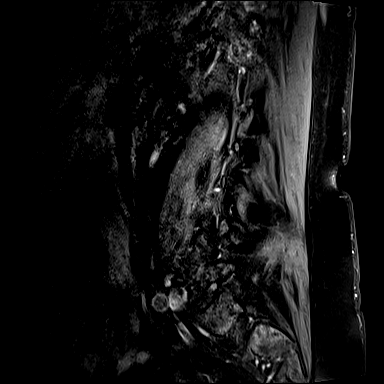
[im 4/15]
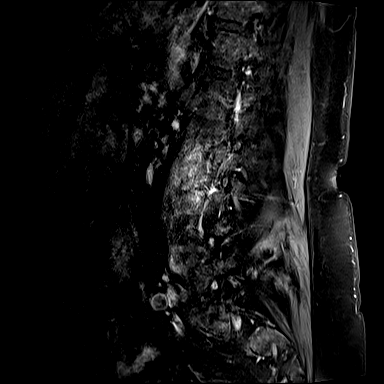
[im 5/15]
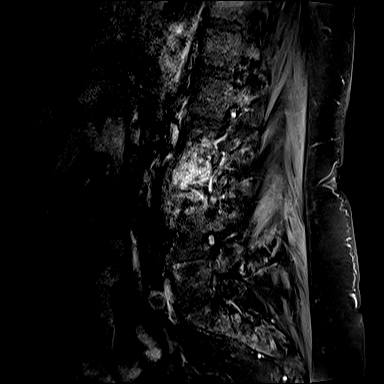
[im 6/15]
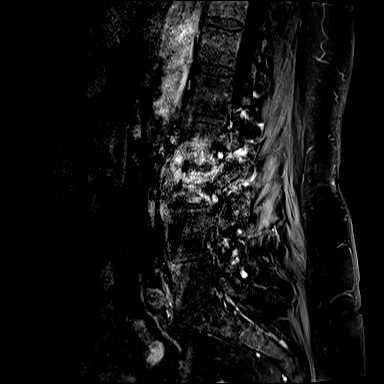
[im 8/15]
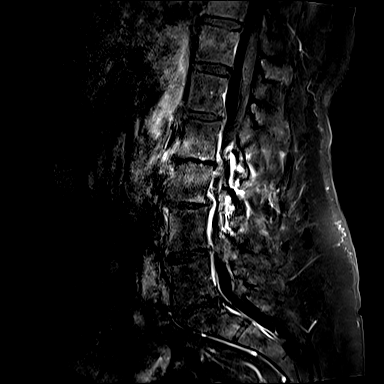
[im 9/15]
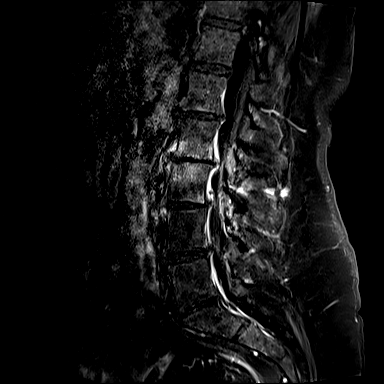
[im 10/15]
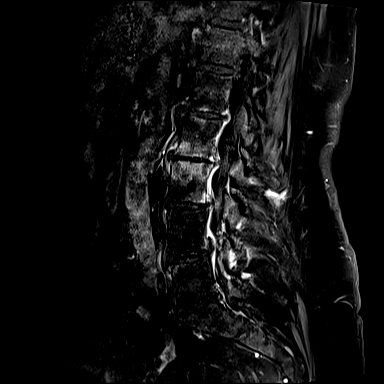
[im 11/15]
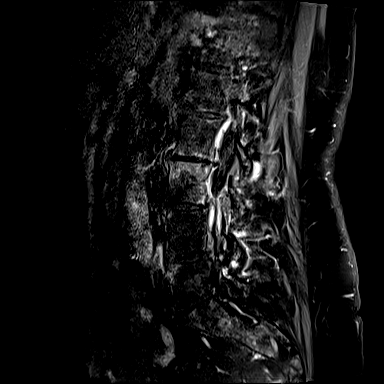
[im 12/15]
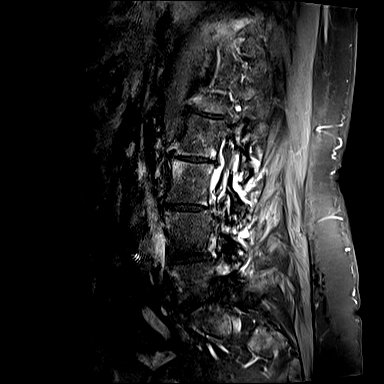
[im 15/15]
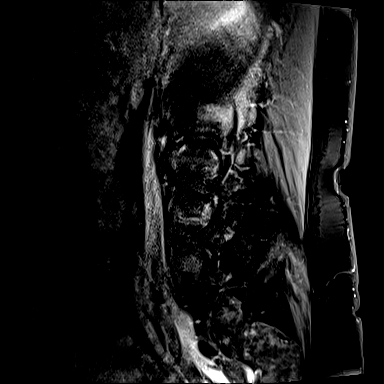

[Series 8: T1 · axial · 4.0mm · 0.28mm/px · z∈[-27,+170]mm · 11 of 41 slices shown]
[im 3/41]
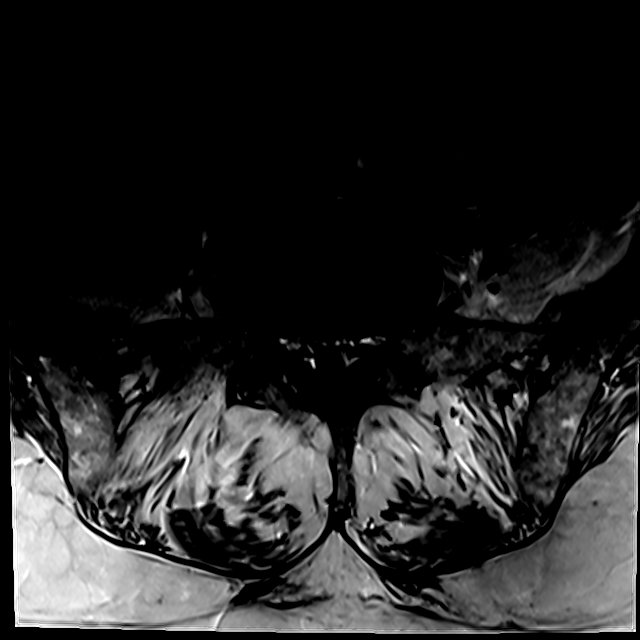
[im 6/41]
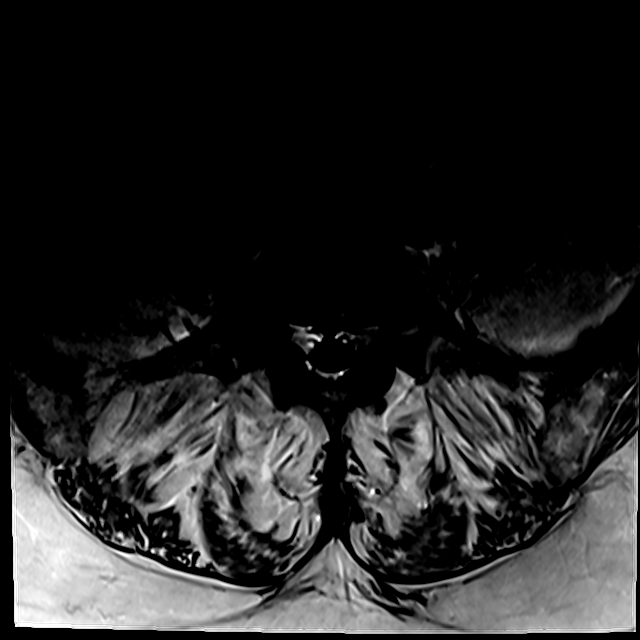
[im 8/41]
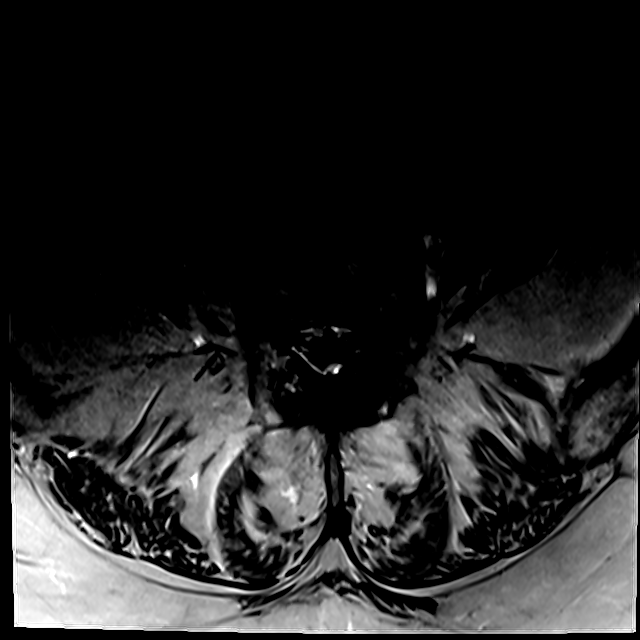
[im 12/41]
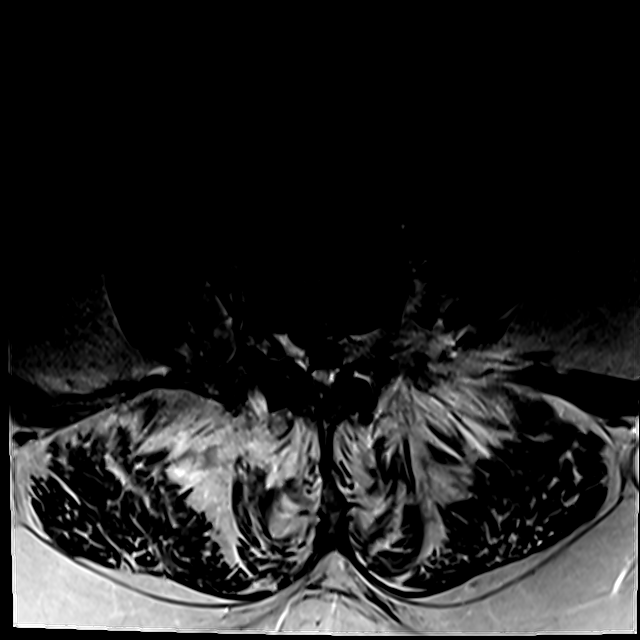
[im 18/41]
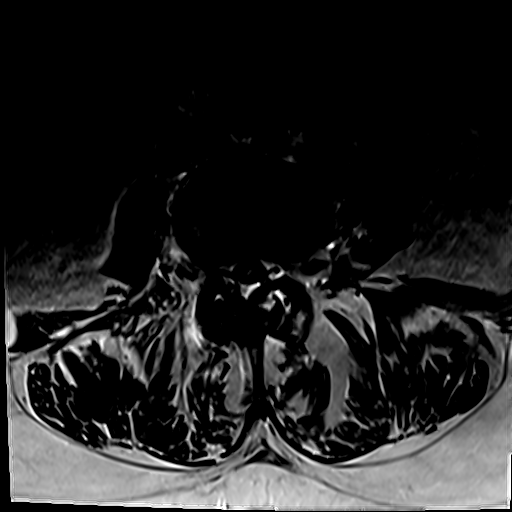
[im 21/41]
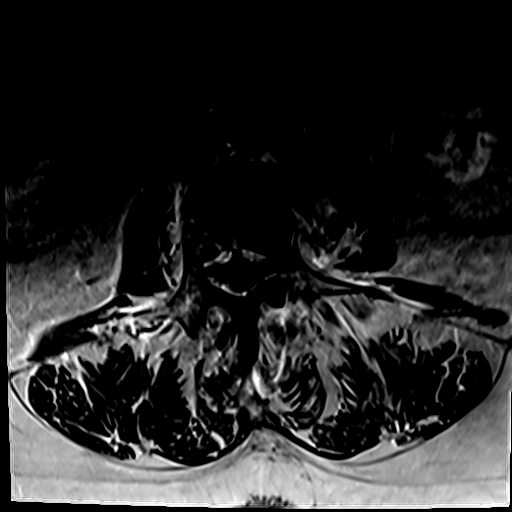
[im 23/41]
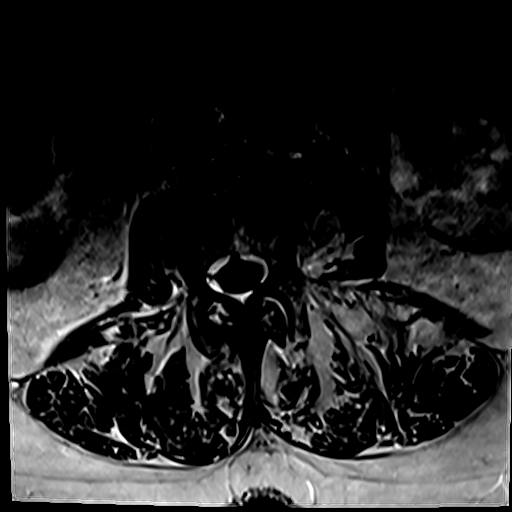
[im 29/41]
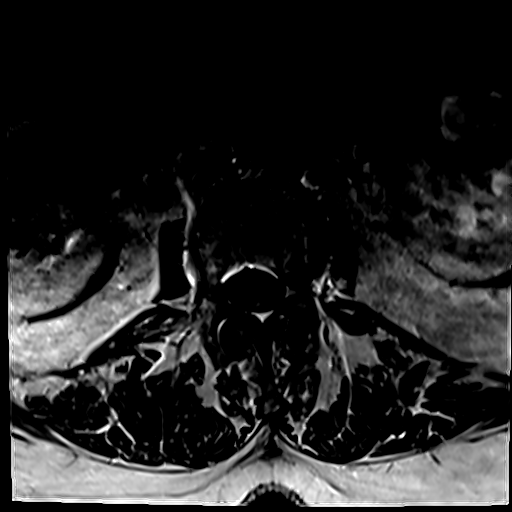
[im 33/41]
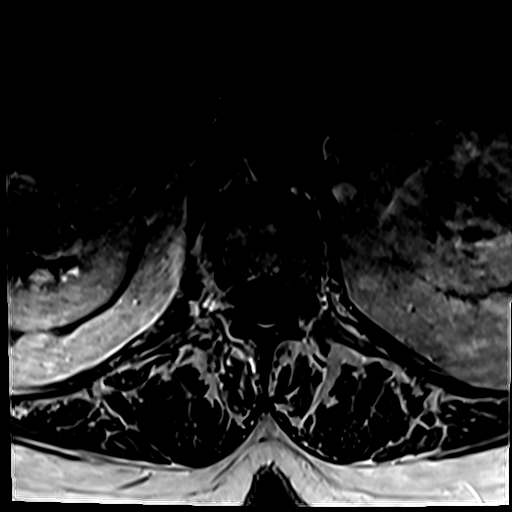
[im 35/41]
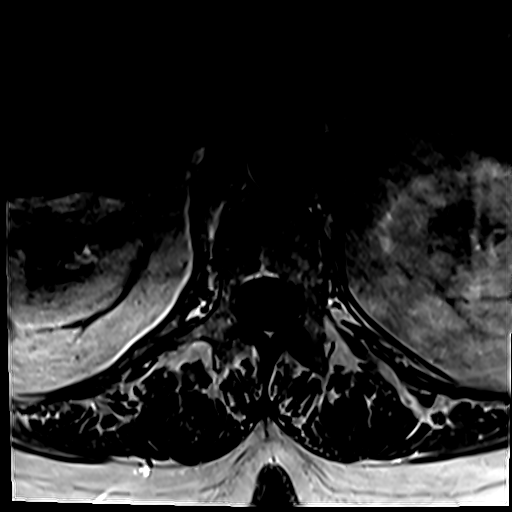
[im 38/41]
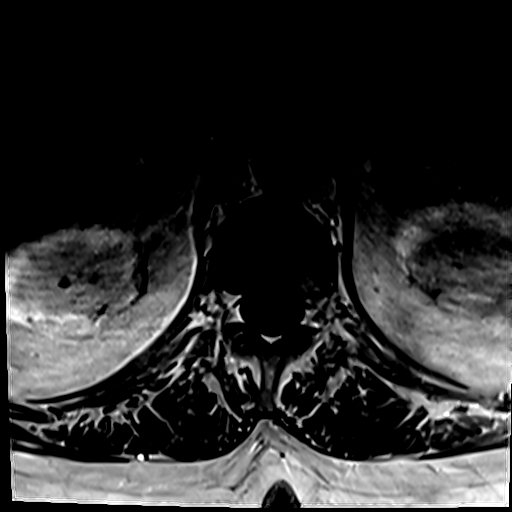

[23 of 48 positions shown; findings below may reference images not displayed]

FINDINGS: Segmentation:  Standard.

Alignment: Small anterolisthesis of L3 over L4. Dextroconvex
scoliosis of the lumbar spine.

Vertebrae:  Contrast enhancement is seen along the L2-3 endplates.

Conus medullaris and cauda equina: Conus and cauda equina showed no
evidence of abnormal contrast enhancement.

Paraspinal and other soft tissues: Edema in the paraspinal soft
tissues adjacent to the L2-3 disc space on the left without evidence
of abscess.

Disc levels:

Spinal canal or neural foraminal better evaluated on prior MRI.
IMPRESSION: Contrast enhancement along the L2-3 endplates and in the adjacent
soft tissues on the left. Findings may be consistent with
degenerative changes. In case of high clinical suspicion for
discitis/osteomyelitis consider repeat imaging in a month to
evaluate for stability.

## 2020-07-23 MED ORDER — GADOBENATE DIMEGLUMINE 529 MG/ML IV SOLN
20.0000 mL | Freq: Once | INTRAVENOUS | Status: AC | PRN
  Administered 2020-07-23: 20 mL via INTRAVENOUS

## 2020-07-30 ENCOUNTER — Other Ambulatory Visit (HOSPITAL_COMMUNITY): Payer: Self-pay | Admitting: Neurosurgery

## 2020-07-30 ENCOUNTER — Other Ambulatory Visit: Payer: Self-pay | Admitting: Neurosurgery

## 2020-07-30 DIAGNOSIS — M48062 Spinal stenosis, lumbar region with neurogenic claudication: Secondary | ICD-10-CM

## 2020-08-06 ENCOUNTER — Other Ambulatory Visit: Payer: Self-pay

## 2020-08-06 ENCOUNTER — Ambulatory Visit (HOSPITAL_COMMUNITY)
Admission: RE | Admit: 2020-08-06 | Discharge: 2020-08-06 | Disposition: A | Discharge status: Home / Self Care | Payer: 59 | Source: Ambulatory Visit | Attending: Neurosurgery | Admitting: Neurosurgery

## 2020-08-06 DIAGNOSIS — M48062 Spinal stenosis, lumbar region with neurogenic claudication: Secondary | ICD-10-CM | POA: Insufficient documentation

## 2020-08-06 IMAGING — CT CT L SPINE W/O CM
4 series · 16 of 33 positions shown, 19 images · non-contrast
Comparison: MRI lumbar spine [DATE] and [DATE].

CLINICAL DATA: Low back pain radiating into the right leg. [facility]
protocol exam.

EXAM:
CT LUMBAR SPINE WITHOUT CONTRAST
TECHNIQUE: Multidetector CT imaging of the lumbar spine was performed without
intravenous contrast administration. Multiplanar CT image
reconstructions were also generated.

[Series 4: l spine soft · axial · 0.27mm/px · z∈[-438,-276]mm · 5 of 122 slices shown, 7 images (1 of 2)]
[im 21/122  soft-tissue]
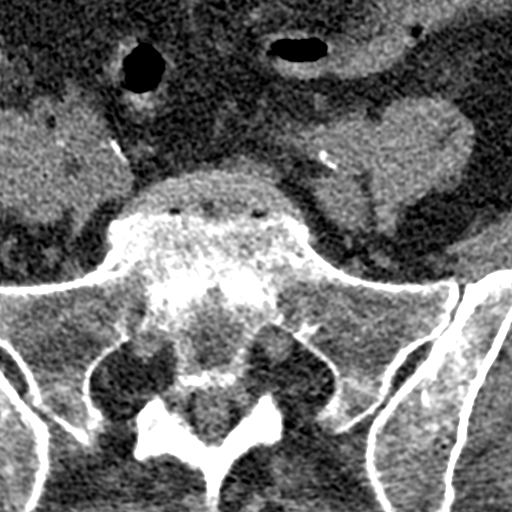
[im 21/122  bone]
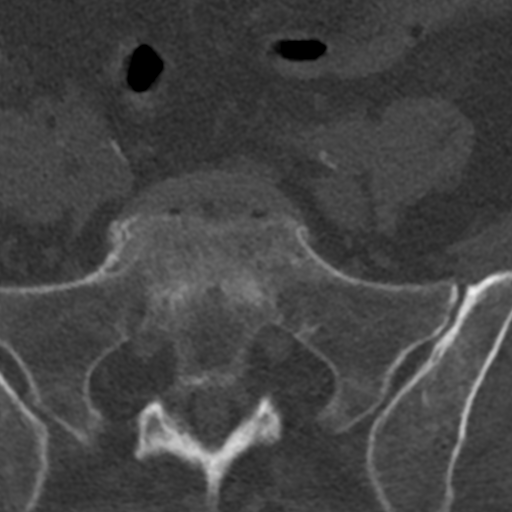
[im 41/122  bone]
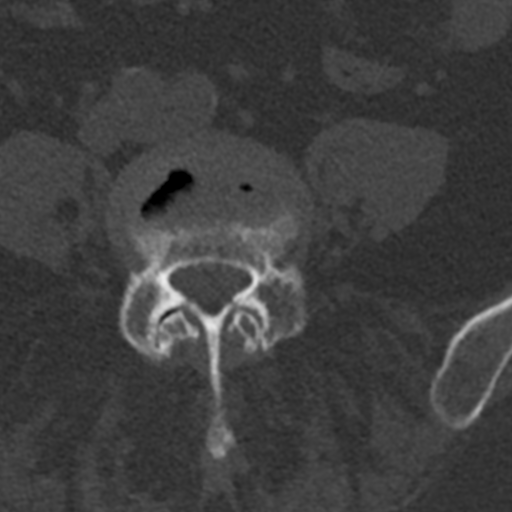
[im 61/122  bone]
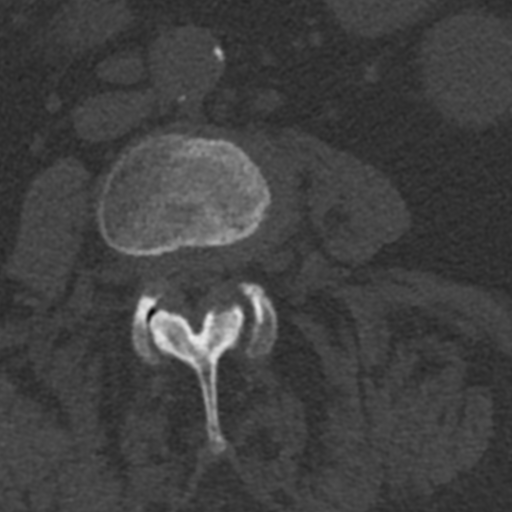
[im 81/122  bone]
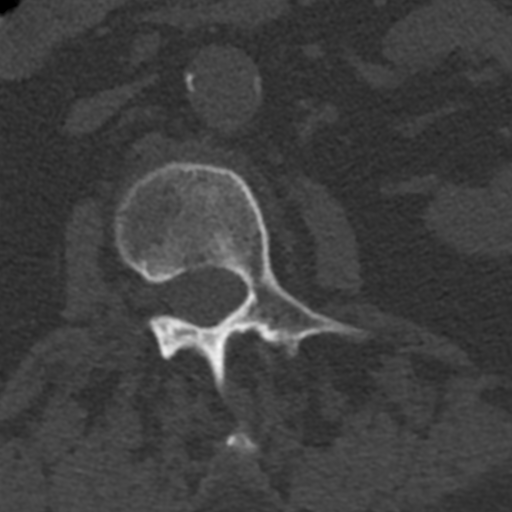
[im 101/122  soft-tissue]
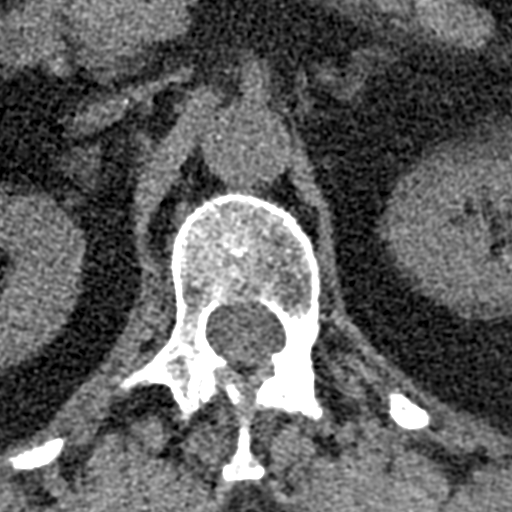
[im 101/122  bone]
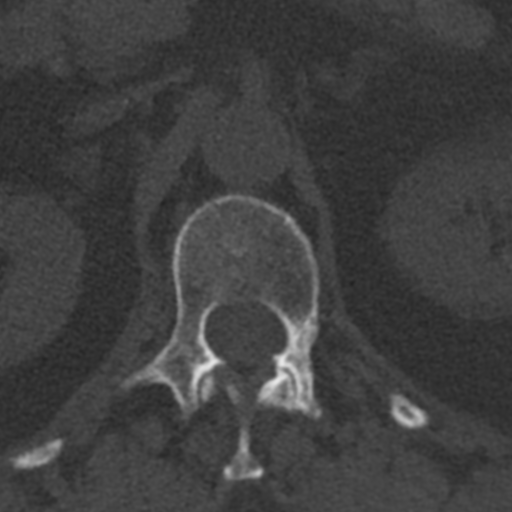

[Series 8: l spine soft · axial · 0.39mm/px · z∈[-438,-356]mm · 3 of 123 slices shown (2 of 2)]
[im 21/123  soft-tissue]
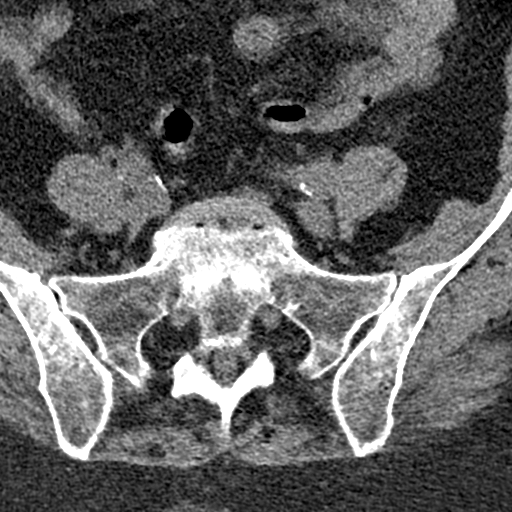
[im 41/123  soft-tissue]
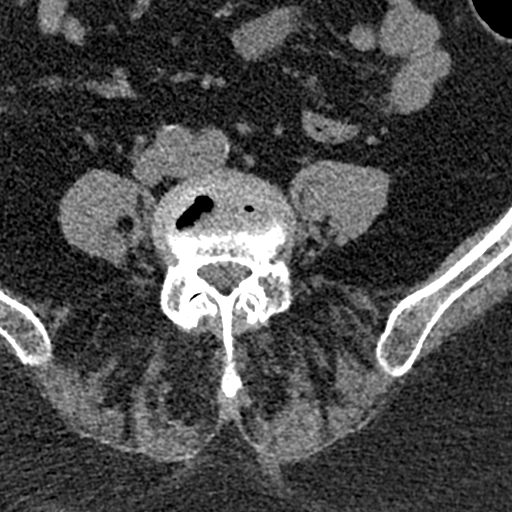
[im 62/123  soft-tissue]
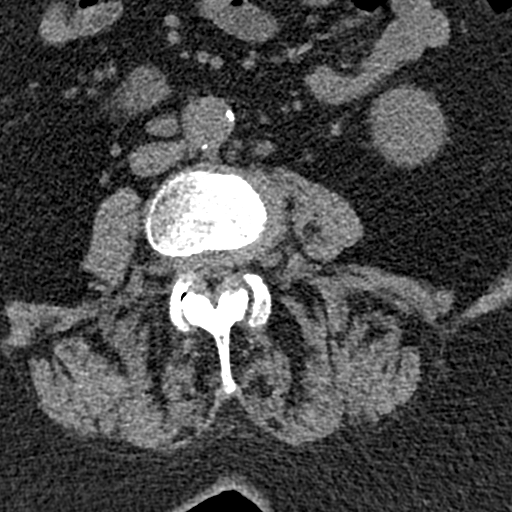

[Series 602: coronal · coronal · 0.48mm/px · 3 of 115 slices shown]
[im 23/115  bone]
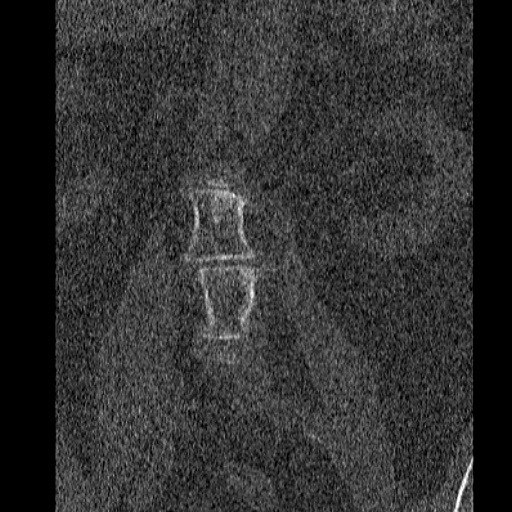
[im 46/115  bone]
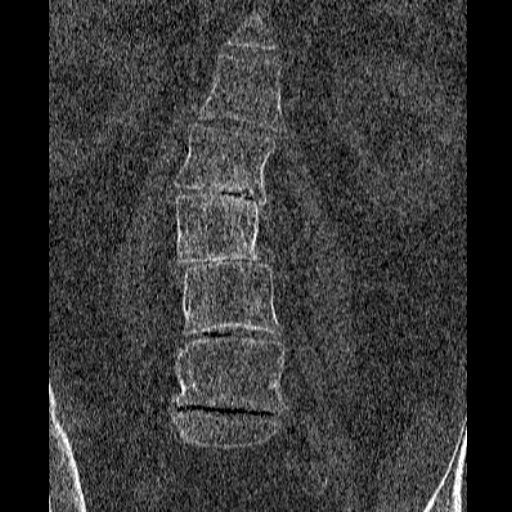
[im 69/115  bone]
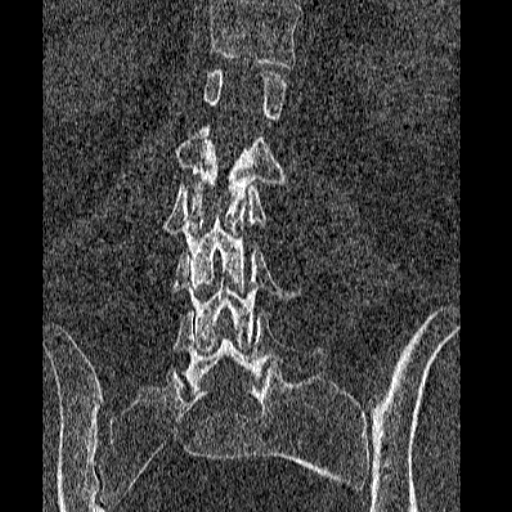

[Series 603: sagittal · sagittal · 0.48mm/px · 5 of 91 slices shown, 6 images]
[im 31/91  bone]
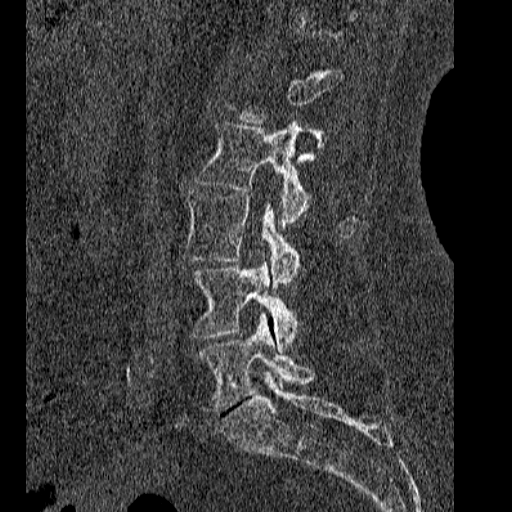
[im 38/91  bone]
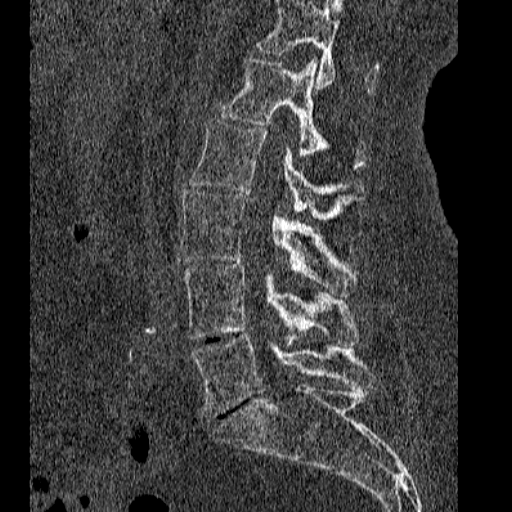
[im 46/91  soft-tissue]
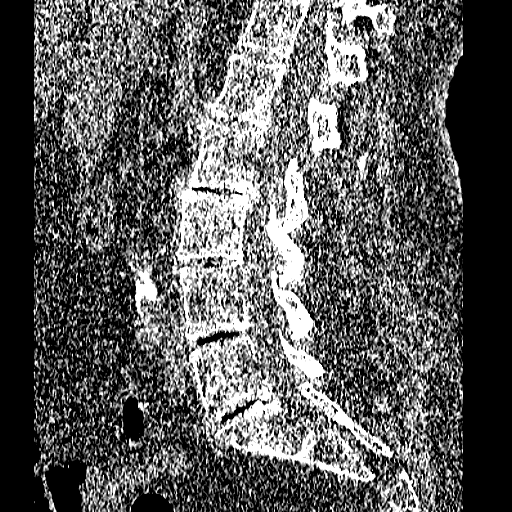
[im 46/91  bone]
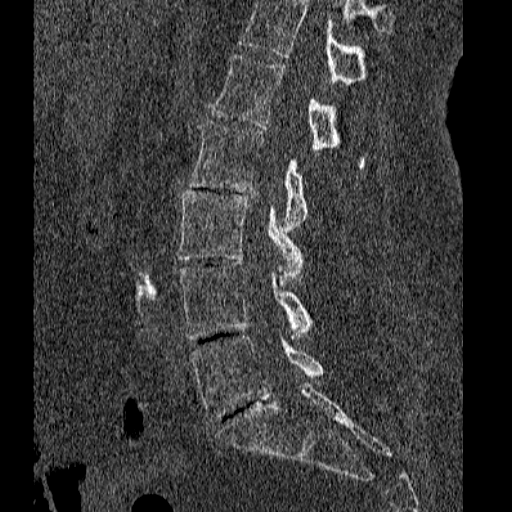
[im 53/91  bone]
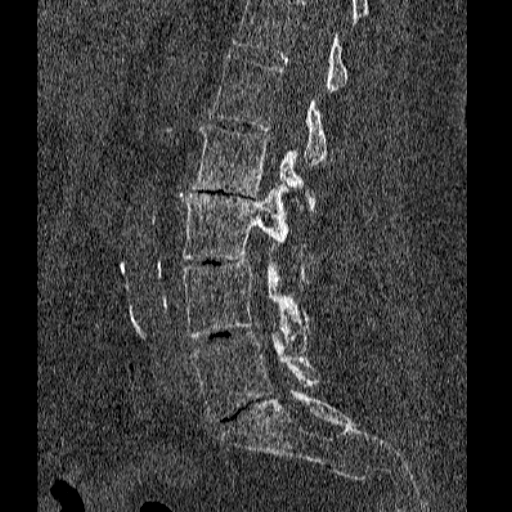
[im 61/91  bone]
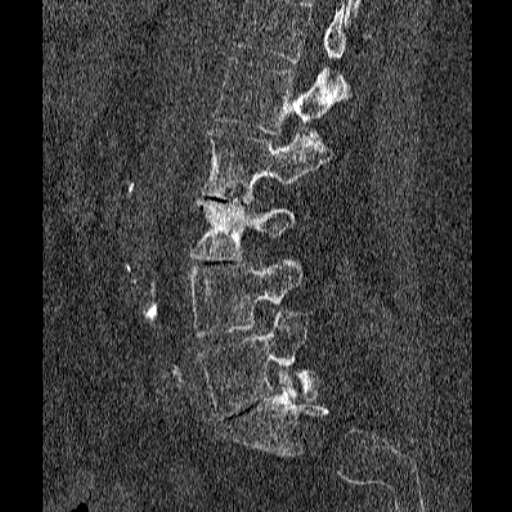

[16 of 33 positions shown; findings below may reference images not displayed]

FINDINGS: Segmentation: Standard.

Alignment: There is convex right scoliosis with the apex at L2-3. No
listhesis.

Vertebrae: No acute fracture or focal pathologic process.

Paraspinal and other soft tissues: Amorphous calcification is seen
to the right of the L2-3 disc interspace likely accounting for
finding on prior MRI. No fluid collection is identified. Aortic
atherosclerosis is noted.

Disc levels: Multilevel spondylosis appears worst at L3-4 and L4-5
and is better demonstrated on the prior MRI scans.
IMPRESSION: No acute abnormality.

Lumbar spondylosis appears worst at L3-4 and L4-5 and is better
demonstrated on prior MRI.

Convex right lumbar scoliosis with the apex at L2-3.

## 2020-08-19 ENCOUNTER — Other Ambulatory Visit: Payer: Self-pay | Admitting: Neurosurgery

## 2020-08-20 ENCOUNTER — Ambulatory Visit: Admitting: Obstetrics and Gynecology

## 2020-09-09 ENCOUNTER — Encounter (HOSPITAL_COMMUNITY): Payer: Self-pay

## 2020-09-09 ENCOUNTER — Encounter (HOSPITAL_COMMUNITY)
Admission: RE | Admit: 2020-09-09 | Discharge: 2020-09-09 | Disposition: A | Discharge status: Home / Self Care | Payer: 59 | Source: Ambulatory Visit | Attending: Neurosurgery | Admitting: Neurosurgery

## 2020-09-09 ENCOUNTER — Other Ambulatory Visit: Payer: Self-pay

## 2020-09-09 DIAGNOSIS — Z01818 Encounter for other preprocedural examination: Secondary | ICD-10-CM | POA: Insufficient documentation

## 2020-09-09 DIAGNOSIS — Z20822 Contact with and (suspected) exposure to covid-19: Secondary | ICD-10-CM | POA: Insufficient documentation

## 2020-09-09 LAB — CBC
HCT: 39.7 % (ref 36.0–46.0)
Hemoglobin: 13.3 g/dL (ref 12.0–15.0)
MCH: 31.6 pg (ref 26.0–34.0)
MCHC: 33.5 g/dL (ref 30.0–36.0)
MCV: 94.3 fL (ref 80.0–100.0)
Platelets: 205 10*3/uL (ref 150–400)
RBC: 4.21 MIL/uL (ref 3.87–5.11)
RDW: 12.1 % (ref 11.5–15.5)
WBC: 4.1 10*3/uL (ref 4.0–10.5)
nRBC: 0 % (ref 0.0–0.2)

## 2020-09-09 LAB — BASIC METABOLIC PANEL
Anion gap: 8 (ref 5–15)
BUN: <5 mg/dL — ABNORMAL LOW (ref 6–20)
CO2: 27 mmol/L (ref 22–32)
Calcium: 9.5 mg/dL (ref 8.9–10.3)
Chloride: 98 mmol/L (ref 98–111)
Creatinine, Ser: 0.71 mg/dL (ref 0.44–1.00)
GFR, Estimated: >60 mL/min (ref >60)
Glucose, Bld: 96 mg/dL (ref 70–99)
Potassium: 3.5 mmol/L (ref 3.5–5.1)
Sodium: 133 mmol/L — ABNORMAL LOW (ref 135–145)

## 2020-09-09 LAB — TYPE AND SCREEN
ABO/RH(D): B POS
Antibody Screen: NEGATIVE

## 2020-09-09 LAB — SURGICAL PCR SCREEN
MRSA, PCR: NEGATIVE
Staphylococcus aureus: NEGATIVE

## 2020-09-09 LAB — SARS CORONAVIRUS 2 (TAT 6-24 HRS): SARS Coronavirus 2: NEGATIVE

## 2020-09-09 NOTE — Pre-Procedure Instructions (Addendum)
Surgical Instructions    Your procedure is scheduled on Thursday, June 16th.  Report to [facility] Main Entrance "A" at 05:30 A.M., then check in with the Admitting office.  Call this number if you have problems the morning of surgery:  [phone]   If you have any questions prior to your surgery date call [phone]: Open Monday-Friday 8am-4pm    Remember:  Do not eat or drink after midnight the night before your surgery.    Take these medicines the morning of surgery with A SIP OF WATER: amLODipine (NORVASC) atenolol (TENORMIN)  omeprazole (PRILOSEC) venlafaxine XR (EFFEXOR XR)  IF NEEDED:  ALPRAZolam (XANAX)  HYDROcodone-acetaminophen (NORCO/VICODIN)   As of today, STOP taking any Aspirin (unless otherwise instructed by your surgeon) Aleve, Naproxen, Ibuprofen, Motrin, Advil, Goody's, BC's, all herbal medications, fish oil, and all vitamins.           The Morning of Surgery: Do not wear jewelry or makeup. Do not wear lotions, powders, perfumes, or deodorant. Do not shave 48 hours prior to surgery.   Do not bring valuables to the hospital. DO Not wear nail polish, gel polish, artificial nails, or any other type of covering on natural nails including finger and toenails. If patients have artificial nails, gel coating, etc. that need to be removed by a nail salon please have this removed prior to surgery or surgery may need to be canceled/delayed if the surgeon/ anesthesia feels like the patient is unable to be adequately monitored.             [facility] is not responsible for any belongings or valuables.  Do NOT Smoke (Tobacco/Vaping) or drink Alcohol 24 hours prior to your procedure.  If you use a CPAP at night, you may bring all equipment for your overnight stay.   Contacts, glasses, dentures or bridgework may not be worn into surgery, please bring cases for these belongings   For patients admitted to the hospital, discharge time will be determined by your  treatment team.   Patients discharged the day of surgery will not be allowed to drive home, and someone needs to stay with them for 24 hours.  ONLY 1 SUPPORT PERSON MAY BE PRESENT WHILE YOU ARE IN SURGERY. IF YOU ARE TO BE ADMITTED ONCE YOU ARE IN YOUR ROOM YOU WILL BE ALLOWED TWO (2) VISITORS.  Minor children may have two parents present. Special consideration for safety and communication needs will be reviewed on a case by case basis.  Special instructions:    Oral Hygiene is also important to reduce your risk of infection.  Remember - BRUSH YOUR TEETH THE MORNING OF SURGERY WITH YOUR REGULAR TOOTHPASTE   [facility]- Preparing For Surgery  Before surgery, you can play an important role. Because skin is not sterile, your skin needs to be as free of germs as possible. You can reduce the number of germs on your skin by washing with CHG (chlorahexidine gluconate) Soap before surgery.  CHG is an antiseptic cleaner which kills germs and bonds with the skin to continue killing germs even after washing.     Please do not use if you have an allergy to CHG or antibacterial soaps. If your skin becomes reddened/irritated stop using the CHG.  Do not shave (including legs and underarms) for at least 48 hours prior to first CHG shower. It is OK to shave your face.  Please follow these instructions carefully.     Shower the NIGHT BEFORE SURGERY and the [facility]  OF SURGERY with CHG Soap.   If you chose to wash your hair, wash your hair first as usual with your normal shampoo. After you shampoo, rinse your hair and body thoroughly to remove the shampoo.  Then [employer] and genitals (private parts) with your normal soap and rinse thoroughly to remove soap.  After that Use CHG Soap as you would any other liquid soap. You can apply CHG directly to the skin and wash gently with a scrungie or a clean washcloth.   Apply the CHG Soap to your body ONLY FROM THE NECK DOWN.  Do not use on open wounds or open sores.  Avoid contact with your eyes, ears, mouth and genitals (private parts). Wash Face and genitals (private parts)  with your normal soap.   Wash thoroughly, paying special attention to the area where your surgery will be performed.  Thoroughly rinse your body with warm water from the neck down.  DO NOT shower/wash with your normal soap after using and rinsing off the CHG Soap.  Pat yourself dry with a CLEAN TOWEL.  Wear CLEAN PAJAMAS to bed the night before surgery  Place CLEAN SHEETS on your bed the night before your surgery  DO NOT SLEEP WITH PETS.   Day of Surgery:  Take a shower with CHG soap. Wear Clean/Comfortable clothing the morning of surgery Do not apply any deodorants/lotions.   Remember to brush your teeth WITH YOUR REGULAR TOOTHPASTE.   Please read over the following fact sheets that you were given.

## 2020-09-09 NOTE — Progress Notes (Signed)
PCP - Dr. Cecille Amsterdam Cardiologist - denies Rheumatology- Dr. Ephriam Jenkins  PPM/ICD - denies  Chest x-ray - n/a EKG - 09/09/20 Stress Test - pt states over 20 years ago and that is was negative. ECHO - denies Cardiac Cath - denies  Sleep Study - denies CPAP - n/a  Pt is not a diabetic.   Blood Thinner Instructions:n/a Aspirin Instructions:n/a  Per Pt's Rheumatologist- Dr. Posey Pronto, her last dose of Morrie Sheldon was 6/814  ERAS Protcol - no PRE-SURGERY Ensure or G2- n/a  COVID TEST- 09/09/20   Anesthesia review: no.  Patient denies shortness of breath, fever, cough and chest pain at PAT appointment   All instructions explained to the patient, with a verbal understanding of the material. Patient agrees to go over the instructions while at home for a better understanding. Patient also instructed to self quarantine after being tested for COVID-19. The opportunity to ask questions was provided.

## 2020-09-10 ENCOUNTER — Other Ambulatory Visit: Payer: Self-pay | Admitting: Neurosurgery

## 2020-09-10 NOTE — Anesthesia Preprocedure Evaluation (Addendum)
Anesthesia Evaluation  Patient identified by MRN, date of birth, ID band Patient awake    Reviewed: Allergy & Precautions, H&P , NPO status , Patient's Chart, lab work & pertinent test results, reviewed documented beta blocker date and time   Airway Mallampati: II  TM Distance: >3 FB Neck ROM: Full    Dental no notable dental hx. (+) Teeth Intact, Dental Advisory Given, Implants,    Pulmonary neg pulmonary ROS, Current Smoker and Patient abstained from smoking.,    Pulmonary exam normal breath sounds clear to auscultation       Cardiovascular Exercise Tolerance: Good hypertension, Pt. on medications and Pt. on home beta blockers negative cardio ROS Normal cardiovascular exam Rhythm:Regular Rate:Normal     Neuro/Psych negative neurological ROS  negative psych ROS   GI/Hepatic negative GI ROS, Neg liver ROS, GERD  Medicated and Controlled,  Endo/Other  negative endocrine ROS  Renal/GU negative Renal ROS  negative genitourinary   Musculoskeletal negative musculoskeletal ROS (+) Arthritis , Osteoarthritis and Rheumatoid disorders,    Abdominal   Peds negative pediatric ROS (+)  Hematology negative hematology ROS (+)   Anesthesia Other Findings   Reproductive/Obstetrics negative OB ROS                           Anesthesia Physical Anesthesia Plan  ASA: 3  Anesthesia Plan: General   Post-op Pain Management:    Induction:   PONV Risk Score and Plan: 3 and Ondansetron, Dexamethasone, Midazolam and Treatment may vary due to age or medical condition  Airway Management Planned: Oral ETT  Additional Equipment: Arterial line  Intra-op Plan:   Post-operative Plan: Extubation in OR  Informed Consent: I have reviewed the patients History and Physical, chart, labs and discussed the procedure including the risks, benefits and alternatives for the proposed anesthesia with the patient or  authorized representative who has indicated his/her understanding and acceptance.       Plan Discussed with: Anesthesiologist and CRNA  Anesthesia Plan Comments: (Will discuss Clear sight option in am with anesthesia team.  Second IV.  )      Anesthesia Quick Evaluation

## 2020-09-10 NOTE — Anesthesia Preprocedure Evaluation (Deleted)
Anesthesia Evaluation    Airway        Dental   Pulmonary Current Smoker,           Cardiovascular hypertension,      Neuro/Psych    GI/Hepatic   Endo/Other    Renal/GU      Musculoskeletal   Abdominal   Peds  Hematology   Anesthesia Other Findings   Reproductive/Obstetrics                             Anesthesia Physical Anesthesia Plan Anesthesia Quick Evaluation  

## 2020-09-10 NOTE — H&P (Addendum)
CC: Back and leg pain HPI:     Patient is a 60 y.o. female p with a history of psoriatic arthritis presented to clinic with significant back and right greater than left leg pain that had progressed despite physical therapy and pain management.  Her leg pain was worse with walking, and her back pain had become unbearable with any kind of mechanical loading of her back.  She was found to have severe degenerative disease of her lumbar spine, including severe inflammatory disc degeneration with coronal imbalance at L2-3, mild spondylolisthesis with disc height loss and severe stenosis at L3-4, and severe lumbar stenosis and large disc herniation at L4-5.  She is here for elective decompression and fusion.  Patient Active Problem List   Diagnosis Date Noted   Tobacco use disorder 10/18/2013   Past Medical History:  Diagnosis Date   History of EKG 08/2011   was reflux-done at [facility] hospital-will request   Hypertension    atenolol and norvasc   Psoriatic arthritis ([place]) 2020   [facility]   Rheumatoid arthritis ([place]) 2017   Seasonal allergies    Smoker     Past Surgical History:  Procedure Laterality Date   ABDOMINAL HYSTERECTOMY     Laparoscopic total hysterectomy for fibroids and bilateral salpingenctomy   CYSTOSCOPY  11/30/2011   Procedure: CYSTOSCOPY;  Surgeon: Peri Maris, MD;  Location: [facility];  Service: Gynecology;  Laterality: N/A;   DIAGNOSTIC LAPAROSCOPY  03/30/1983   ectopic   DILATION AND CURETTAGE OF UTERUS      No medications prior to admission.   Allergies  Allergen Reactions   Cosentyx [Secukinumab] Hives    Social History   Tobacco Use   Smoking status: Every Day    Packs/day: 1.00    Pack years: 0.00    Types: Cigarettes   Smokeless tobacco: Never  Substance Use Topics   Alcohol use: Not Currently    Alcohol/week: 2.0 standard drinks    Types: 2 Glasses of wine per week    Comment: occas    Family History  Problem Relation Age of Onset    Osteoporosis Mother    Cancer Mother        renal cell, brain   CAD Father    CAD Paternal Uncle    Parkinson's disease Maternal Grandfather    Cervical cancer Paternal Grandmother    CAD Paternal Grandfather      Review of Systems Pertinent items noted in HPI and remainder of comprehensive ROS otherwise negative.  Objective:   No data found. No intake/output data recorded. No intake/output data recorded.      General : Alert, cooperative, appears stated age.  Appears uncomfortable due to pain in back.   Head:  Normocephalic/atraumatic    Eyes: PERRL, conjunctiva/corneas clear, EOM's intact. Fundi could not be visualized Neck: Supple Chest:  Respirations unlabored Chest wall: no tenderness or deformity Heart: Regular rate and rhythm Abdomen: Soft, nontender and nondistended Extremities: warm and well-perfused Skin: normal turgor, color and texture Neurologic:  Alert, oriented x 3.  Eyes open spontaneously. PERRL, EOMI, VFC, no facial droop. V1-3 intact.  No dysarthria, tongue protrusion symmetric.  CNII-XII intact. Normal strength, sensation and reflexes throughout.  No pronator drift, full strength in legs.  Positive straight leg raise on right.       Data ReviewCBC:  Lab Results  Component Value Date   WBC 4.1 09/09/2020   RBC 4.21 09/09/2020   BMP:  Lab Results  Component Value Date  GLUCOSE 96 09/09/2020   CO2 27 09/09/2020   BUN <5 (L) 09/09/2020   CREATININE 0.71 09/09/2020   CALCIUM 9.5 09/09/2020   Radiology review:  There is severe lumbar stenosis at L3-4 and L4-5 with large disc herniation at L4-5, moderate size disc herniation at L3-4 with disc space height loss and mild spondylolisthesis.  At L2-3, there is severe degenerative disc disease with significant acute Modic changes and inflammation, with enhancement noted on MRI.  There is significant coronal imbalance, mainly because of the severe L2-3 disc degeneration.  Additionally, there is sagittal  imbalance as well.  Assessment:   Active Problems:   * No active hospital problems. *  This is a 60 year old woman with psoriatic arthritis who has progressive symptoms of neurogenic claudication, radiculopathy and mechanical back pain related to advanced degenerative disc disease. Plan:   -I had a long discussion with the patient and family.  I have recommended surgery given the severity of her symptoms which have been progressive.  I think the goals of surgery could be best accomplished with a combination of lateral approach for her L2-3 and L3-4 severe degenerative disc disease as well as a posterior approach for decompression at L4-5, with accompanying fusion for her significant mechanical back pain.  She has a degenerated Z6-X0 disc and it certainly would be reasonable to extend the fusion to the sacrum.  However, with her history of psoriatic arthritis, it may be best to treat the symptomatic levels rather than attempting a larger surgery to address all of her degenerated spine.  Risks, benefits, alternatives, expected convalescence were discussed with her.  Risks discussed included, but were not limited to, bleeding, pain, infection, scar, neurologic deficit, weakness, spinal fluid leak, pseudoarthrosis, adjacent segment disease, need for further surgery, vascular injury, coma, and death.  Informed consent was obtained.  We will go ahead with the planned surgery.

## 2020-09-11 ENCOUNTER — Encounter (HOSPITAL_COMMUNITY)
Admission: RE | Disposition: A | Discharge status: Home Health | Payer: Self-pay | Source: Home / Self Care | Attending: Neurosurgery

## 2020-09-11 ENCOUNTER — Inpatient Hospital Stay (HOSPITAL_COMMUNITY): Payer: 59 | Admitting: Anesthesiology

## 2020-09-11 ENCOUNTER — Inpatient Hospital Stay (HOSPITAL_COMMUNITY): Payer: 59

## 2020-09-11 ENCOUNTER — Inpatient Hospital Stay (HOSPITAL_COMMUNITY)
Admission: RE | Admit: 2020-09-11 | Discharge: 2020-09-13 | DRG: 455 | Disposition: A | Discharge status: Home Health | Payer: 59 | Attending: Neurological Surgery | Admitting: Neurological Surgery

## 2020-09-11 ENCOUNTER — Encounter (HOSPITAL_COMMUNITY): Payer: Self-pay

## 2020-09-11 ENCOUNTER — Other Ambulatory Visit: Payer: Self-pay

## 2020-09-11 DIAGNOSIS — M5116 Intervertebral disc disorders with radiculopathy, lumbar region: Secondary | ICD-10-CM | POA: Diagnosis present

## 2020-09-11 DIAGNOSIS — Z888 Allergy status to other drugs, medicaments and biological substances status: Secondary | ICD-10-CM | POA: Diagnosis not present

## 2020-09-11 DIAGNOSIS — Z820 Family history of epilepsy and other diseases of the nervous system: Secondary | ICD-10-CM | POA: Diagnosis not present

## 2020-09-11 DIAGNOSIS — M48062 Spinal stenosis, lumbar region with neurogenic claudication: Secondary | ICD-10-CM | POA: Diagnosis present

## 2020-09-11 DIAGNOSIS — Z8249 Family history of ischemic heart disease and other diseases of the circulatory system: Secondary | ICD-10-CM | POA: Diagnosis not present

## 2020-09-11 DIAGNOSIS — M069 Rheumatoid arthritis, unspecified: Secondary | ICD-10-CM | POA: Diagnosis present

## 2020-09-11 DIAGNOSIS — E882 Lipomatosis, not elsewhere classified: Secondary | ICD-10-CM | POA: Diagnosis present

## 2020-09-11 DIAGNOSIS — Z90710 Acquired absence of both cervix and uterus: Secondary | ICD-10-CM | POA: Diagnosis not present

## 2020-09-11 DIAGNOSIS — Z8049 Family history of malignant neoplasm of other genital organs: Secondary | ICD-10-CM | POA: Diagnosis not present

## 2020-09-11 DIAGNOSIS — M5416 Radiculopathy, lumbar region: Secondary | ICD-10-CM | POA: Diagnosis present

## 2020-09-11 DIAGNOSIS — I10 Essential (primary) hypertension: Secondary | ICD-10-CM | POA: Diagnosis present

## 2020-09-11 DIAGNOSIS — L4050 Arthropathic psoriasis, unspecified: Secondary | ICD-10-CM | POA: Diagnosis present

## 2020-09-11 DIAGNOSIS — Z8262 Family history of osteoporosis: Secondary | ICD-10-CM | POA: Diagnosis not present

## 2020-09-11 DIAGNOSIS — Z419 Encounter for procedure for purposes other than remedying health state, unspecified: Secondary | ICD-10-CM

## 2020-09-11 DIAGNOSIS — F17210 Nicotine dependence, cigarettes, uncomplicated: Secondary | ICD-10-CM | POA: Diagnosis present

## 2020-09-11 DIAGNOSIS — M4316 Spondylolisthesis, lumbar region: Secondary | ICD-10-CM | POA: Diagnosis present

## 2020-09-11 DIAGNOSIS — M8580 Other specified disorders of bone density and structure, unspecified site: Secondary | ICD-10-CM | POA: Diagnosis present

## 2020-09-11 DIAGNOSIS — K219 Gastro-esophageal reflux disease without esophagitis: Secondary | ICD-10-CM | POA: Diagnosis present

## 2020-09-11 DIAGNOSIS — Z90722 Acquired absence of ovaries, bilateral: Secondary | ICD-10-CM

## 2020-09-11 DIAGNOSIS — M5117 Intervertebral disc disorders with radiculopathy, lumbosacral region: Secondary | ICD-10-CM | POA: Diagnosis present

## 2020-09-11 HISTORY — PX: TRANSFORAMINAL LUMBAR INTERBODY FUSION W/ MIS 2 LEVEL: SHX6146

## 2020-09-11 LAB — POCT I-STAT 7, (LYTES, BLD GAS, ICA,H+H)
Acid-base deficit: 1 mmol/L (ref 0.0–2.0)
Acid-base deficit: 3 mmol/L — ABNORMAL HIGH (ref 0.0–2.0)
Bicarbonate: 25.8 mmol/L (ref 20.0–28.0)
Bicarbonate: 22.4 mmol/L (ref 20.0–28.0)
Calcium, Ion: 1.17 mmol/L (ref 1.15–1.40)
Calcium, Ion: 1.15 mmol/L (ref 1.15–1.40)
HCT: 37 % (ref 36.0–46.0)
HCT: 33 % — ABNORMAL LOW (ref 36.0–46.0)
Hemoglobin: 12.6 g/dL (ref 12.0–15.0)
Hemoglobin: 11.2 g/dL — ABNORMAL LOW (ref 12.0–15.0)
O2 Saturation: 99 %
O2 Saturation: 100 %
Patient temperature: 36.2
Patient temperature: 36.4
Potassium: 3.5 mmol/L (ref 3.5–5.1)
Potassium: 3.7 mmol/L (ref 3.5–5.1)
Sodium: 135 mmol/L (ref 135–145)
Sodium: 136 mmol/L (ref 135–145)
TCO2: 27 mmol/L (ref 22–32)
TCO2: 24 mmol/L (ref 22–32)
pCO2 arterial: 46.4 mmHg (ref 32.0–48.0)
pCO2 arterial: 38.1 mmHg (ref 32.0–48.0)
pH, Arterial: 7.349 — ABNORMAL LOW (ref 7.350–7.450)
pH, Arterial: 7.374 (ref 7.350–7.450)
pO2, Arterial: 165 mmHg — ABNORMAL HIGH (ref 83.0–108.0)
pO2, Arterial: 196 mmHg — ABNORMAL HIGH (ref 83.0–108.0)

## 2020-09-11 LAB — ABO/RH: ABO/RH(D): B POS

## 2020-09-11 IMAGING — RF DG LUMBAR SPINE 2-3V
1 series · 10 of 10 positions shown · non-contrast
Comparison: CT [DATE].

CLINICAL DATA: Evaluation for unexpected foreign bodies.  Effusion.

EXAM:
LUMBAR SPINE - 2-3 VIEW

[Series 1: run · 10 of 10 slices shown]
[im 1/10]
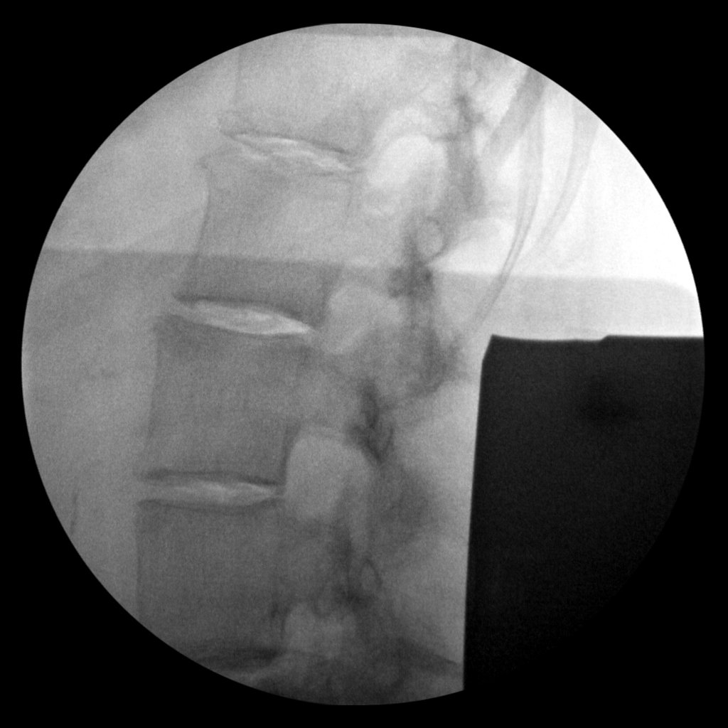
[im 2/10]
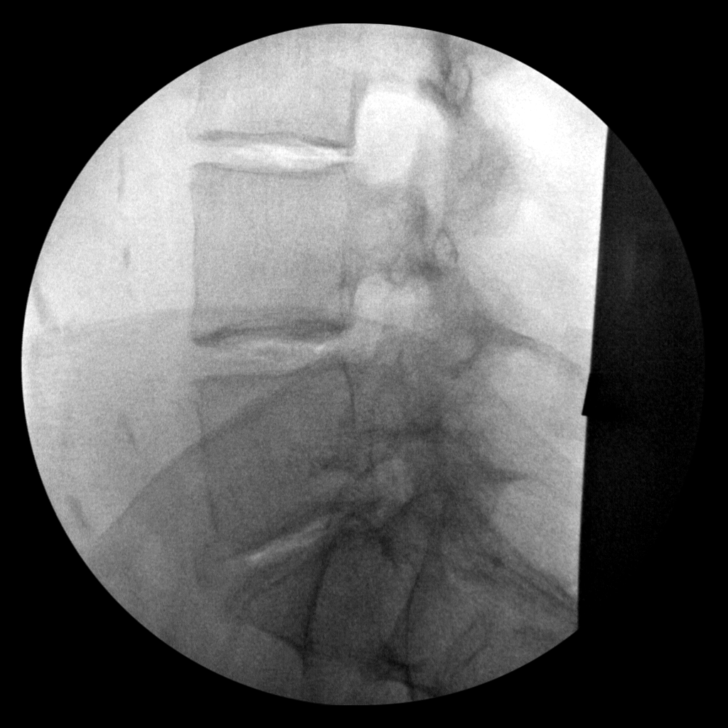
[im 3/10]
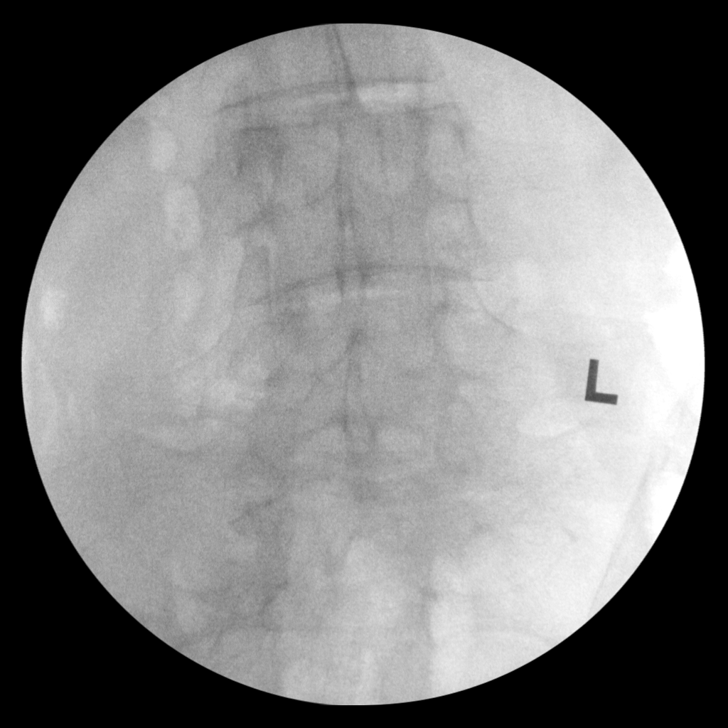
[im 4/10]
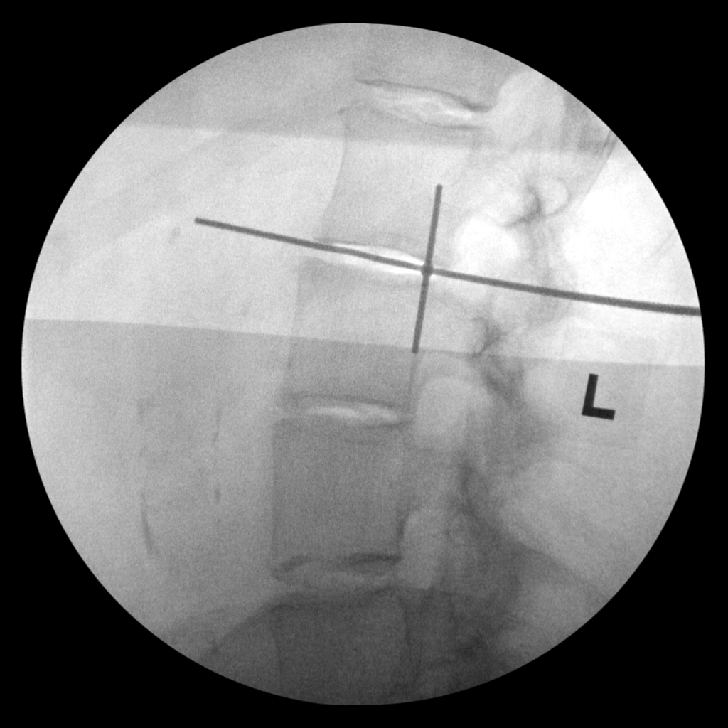
[im 5/10]
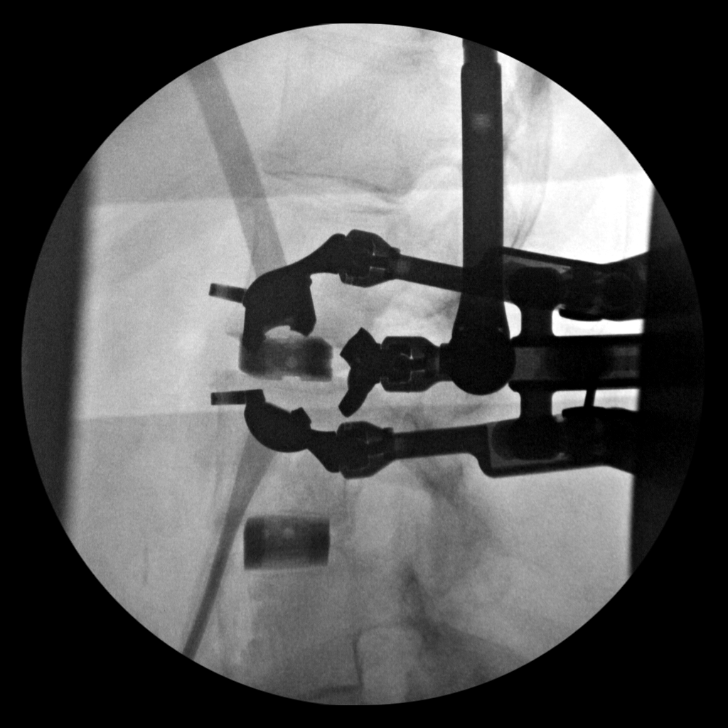
[im 6/10]
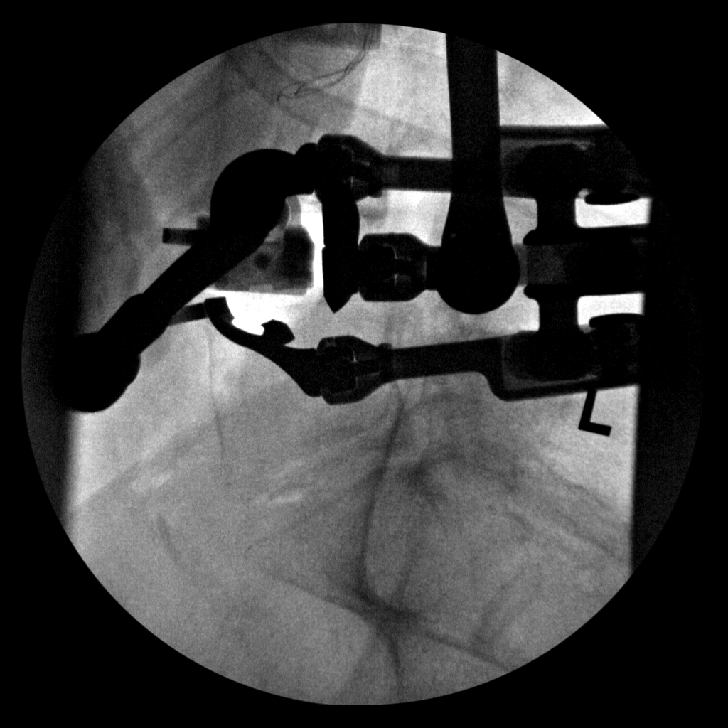
[im 7/10]
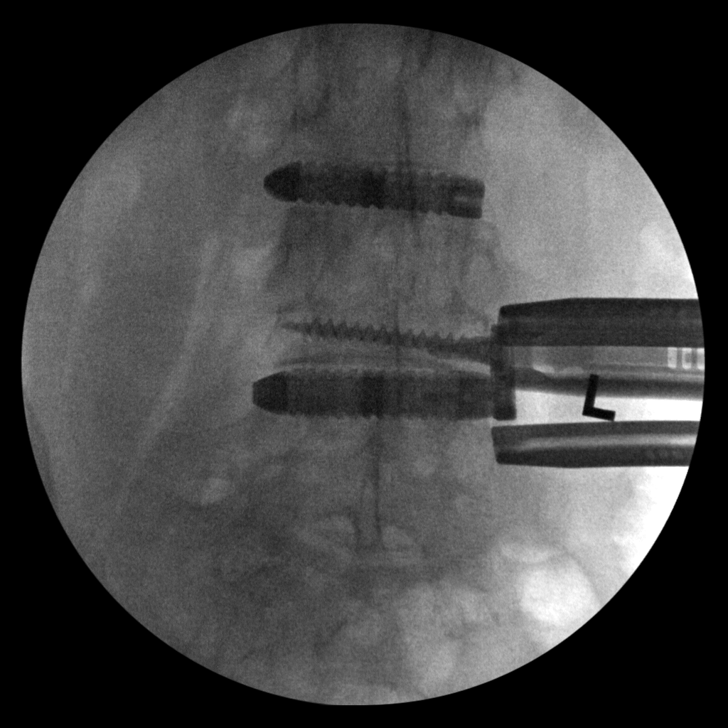
[im 8/10]
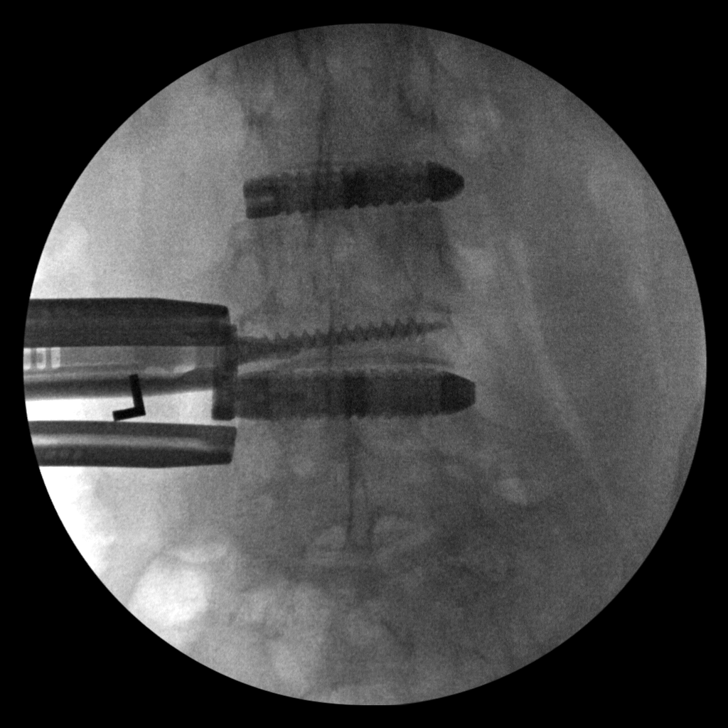
[im 9/10]
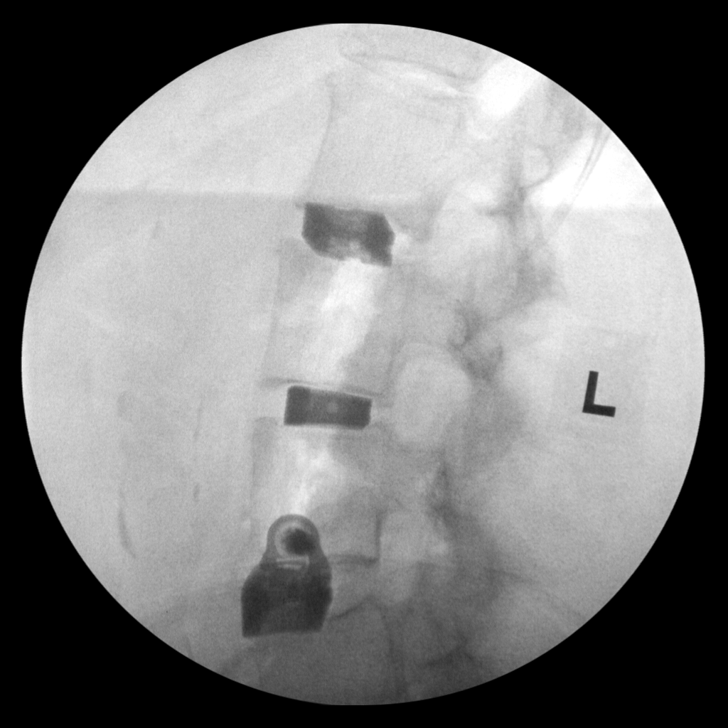
[im 10/10]
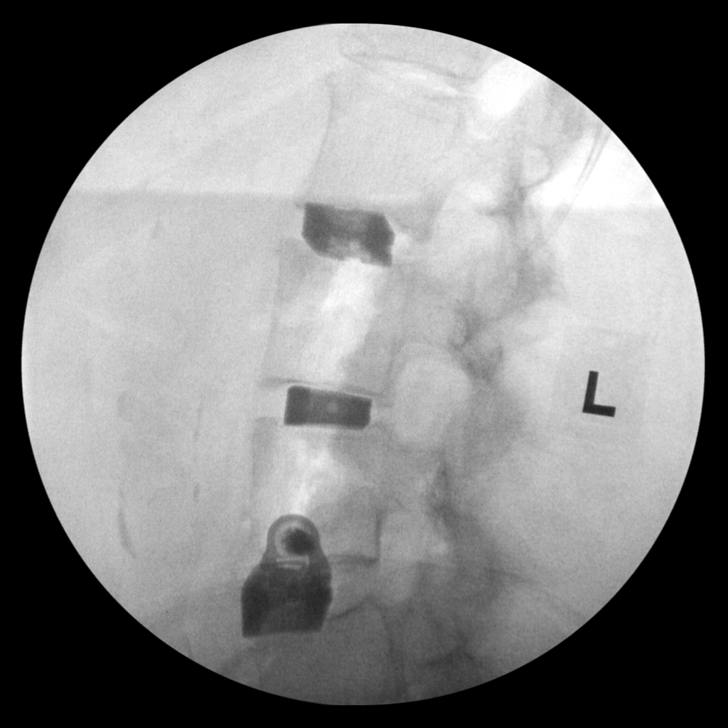

[10 of 10 positions shown; findings below may reference images not displayed]

FINDINGS: Postsurgical changes of interbody fusion at what appears to be
L2-L3, L3-L4 and L4-L5 on the lateral (first image), although the
limited field of view limits accurate assessments of the levels. The
second frontal fluoroscopic image demonstrates interbody fusion at
L3-L4 and L4-L5 with suspected lateral plate and screw fixation on
the left at L4-L5. No unexpected foreign bodies. Please see the
performing provider's procedural report for further detail.

Findings discussed with the OR at [DATE].
IMPRESSION: Intraoperative fluoroscopy, as detailed above.

## 2020-09-11 IMAGING — RF DG C-ARM 1-60 MIN
1 series · 10 of 10 positions shown · non-contrast
Comparison: CT lumbar spine [DATE].

CLINICAL DATA: L2-L4 DLIF.  L4-S1 TLIF.

EXAM:
DG C-ARM 1-60 MIN; LUMBAR SPINE - 2-3 VIEW
FLUOROSCOPY TIME:  Fluoroscopy Time:  4 MINUTES AND 3 SECONDS.
Radiation Exposure Index (if provided by the fluoroscopic device):
219.24
Number of Acquired Spot Images: 10

[Series 1: run · 10 of 10 slices shown]
[im 1/10]
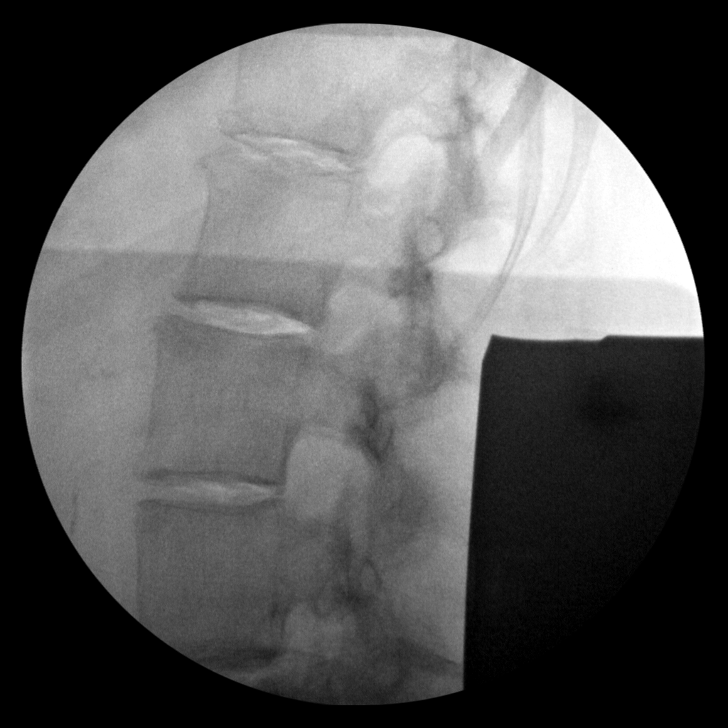
[im 2/10]
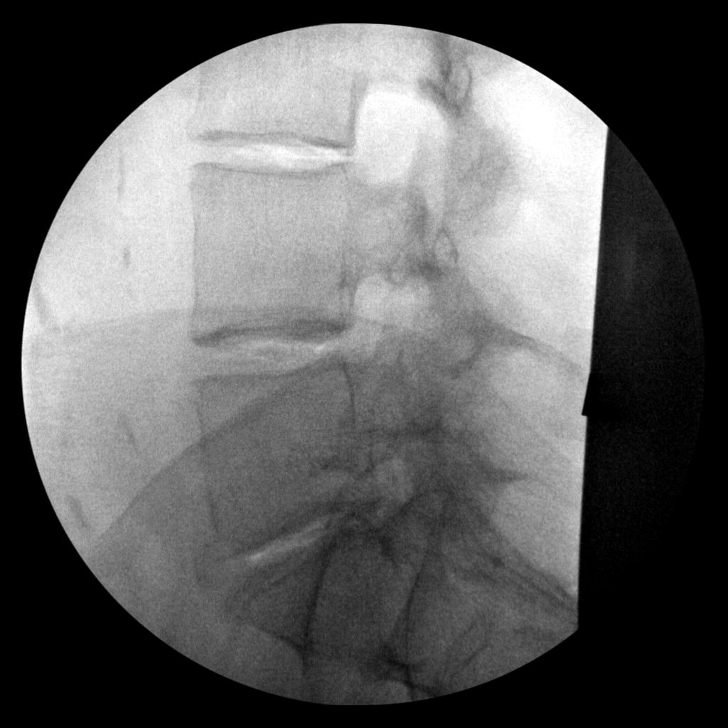
[im 3/10]
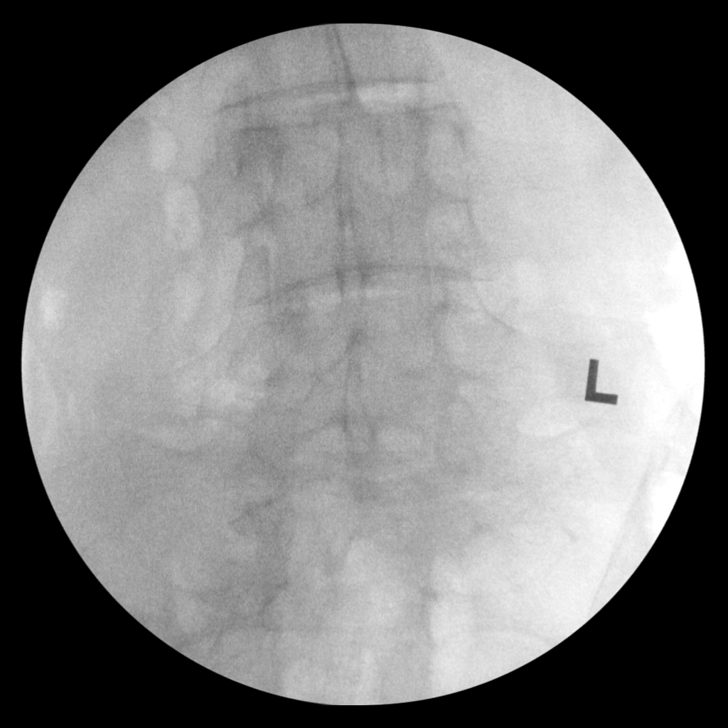
[im 4/10]
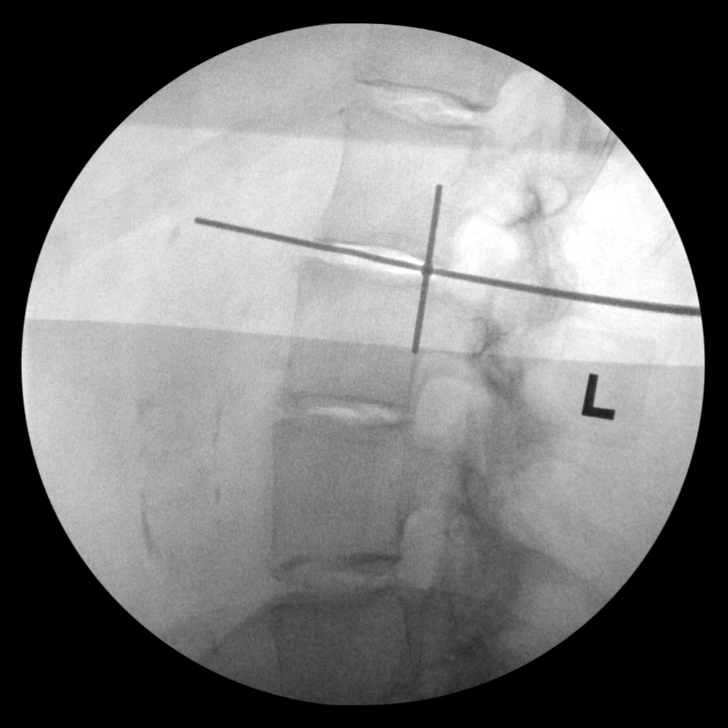
[im 5/10]
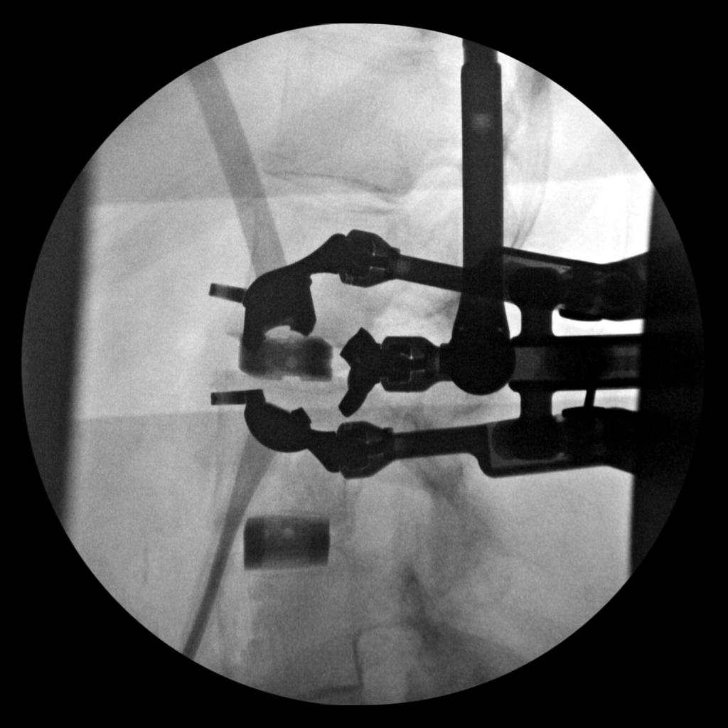
[im 6/10]
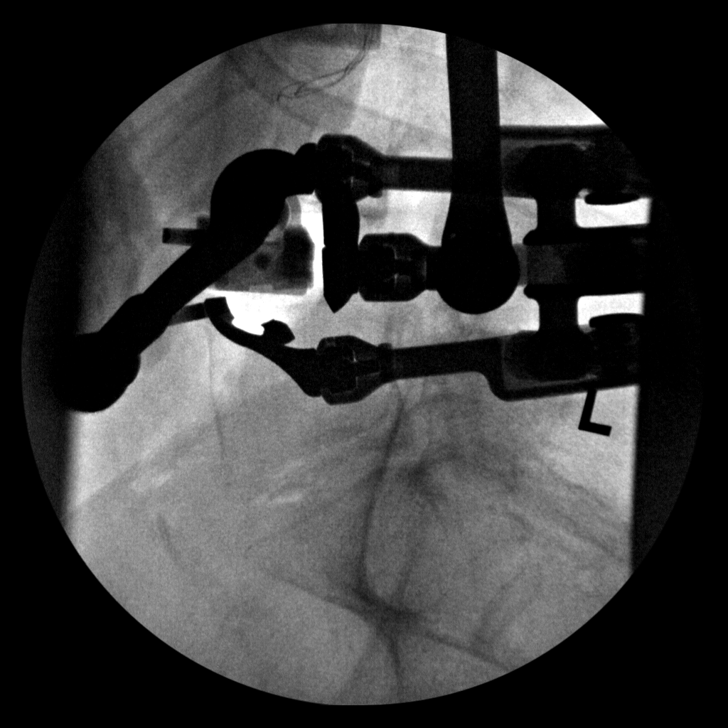
[im 7/10]
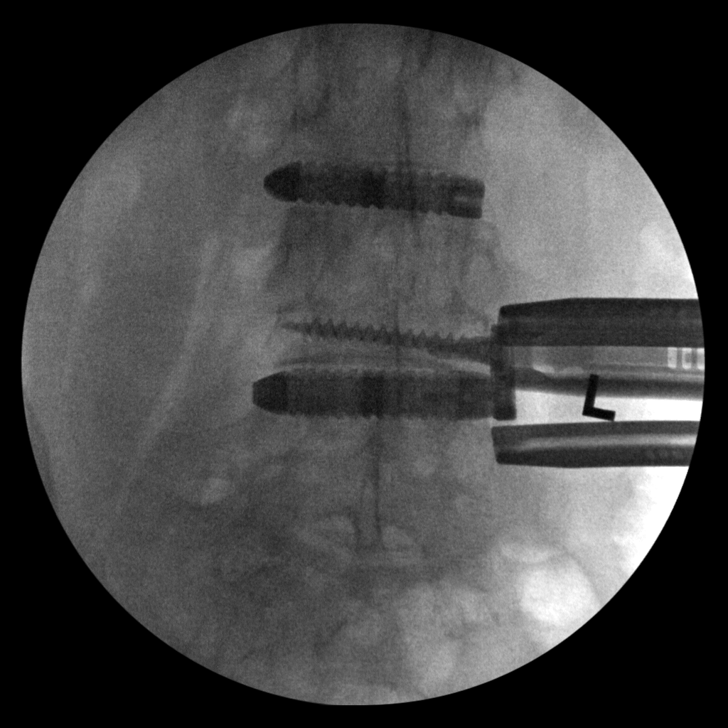
[im 8/10]
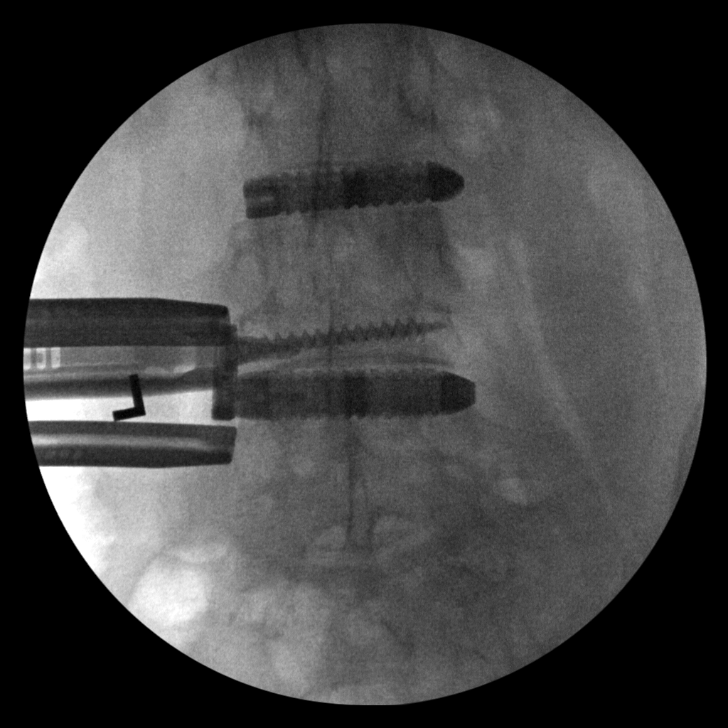
[im 9/10]
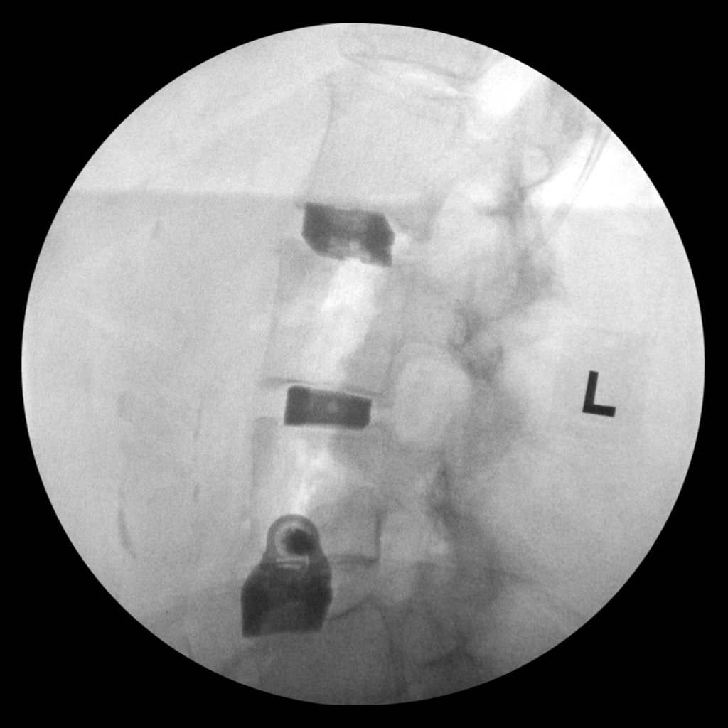
[im 10/10]
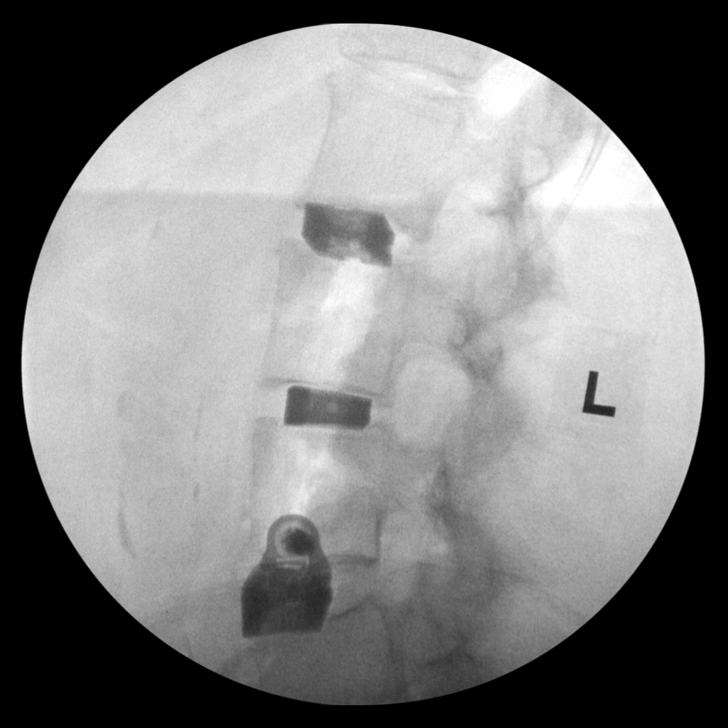

[10 of 10 positions shown; findings below may reference images not displayed]

FINDINGS: Ten C-arm fluoroscopic images were obtained intraoperatively and
submitted for post operative interpretation. These images
demonstrate lateral interbody fusion at L2-L3, L3-L4, and L4-L5 with
left lateral screw at L4-L5. No unexpected findings. No unexpected
foreign bodies. Please see the performing provider's procedural
report for further detail.
IMPRESSION: Intraoperative fluoroscopy, as detailed above.

## 2020-09-11 IMAGING — RF DG LUMBAR SPINE 2-3V
1 series · 10 of 10 positions shown · non-contrast
Comparison: CT lumbar spine [DATE].

CLINICAL DATA: L2-L4 DLIF.  L4-S1 TLIF.

EXAM:
DG C-ARM 1-60 MIN; LUMBAR SPINE - 2-3 VIEW
FLUOROSCOPY TIME:  Fluoroscopy Time:  4 MINUTES AND 3 SECONDS.
Radiation Exposure Index (if provided by the fluoroscopic device):
219.24
Number of Acquired Spot Images: 10

[Series 1: run · 10 of 10 slices shown]
[im 1/10]
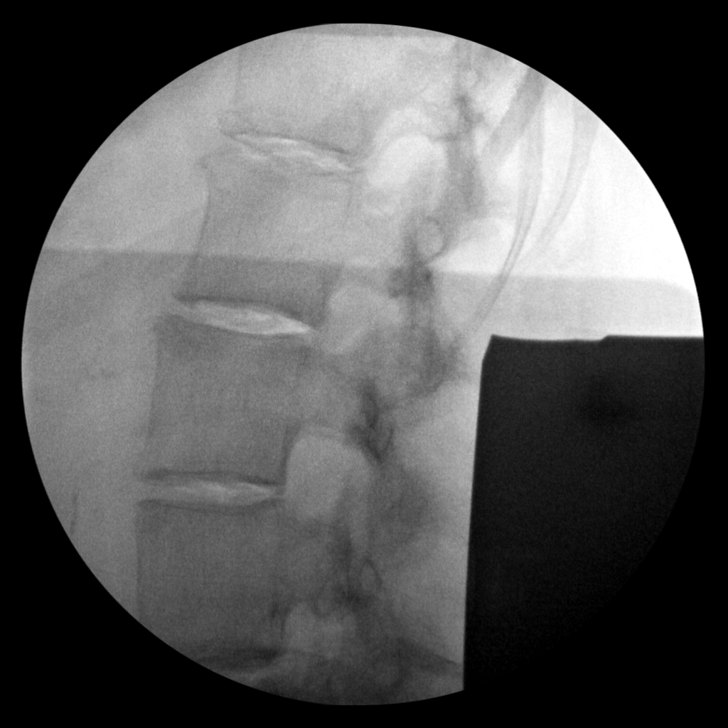
[im 2/10]
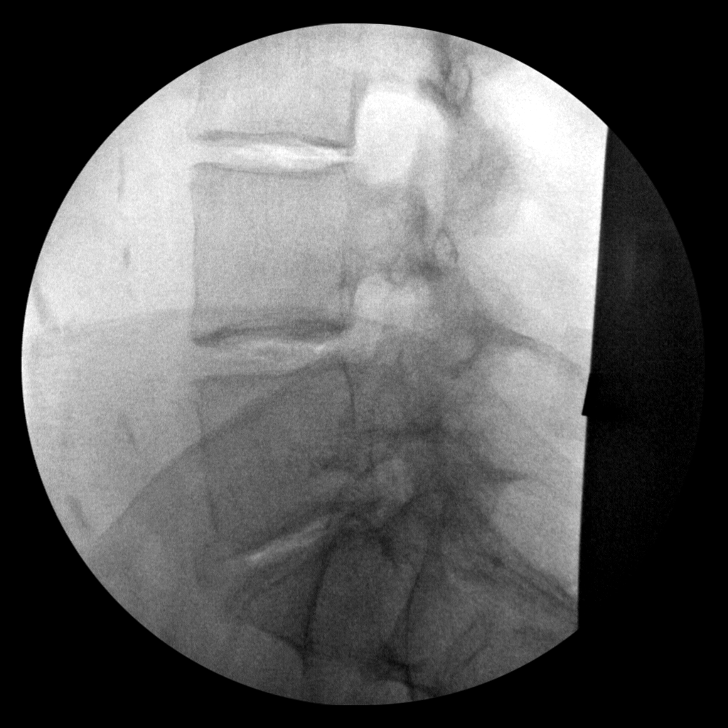
[im 3/10]
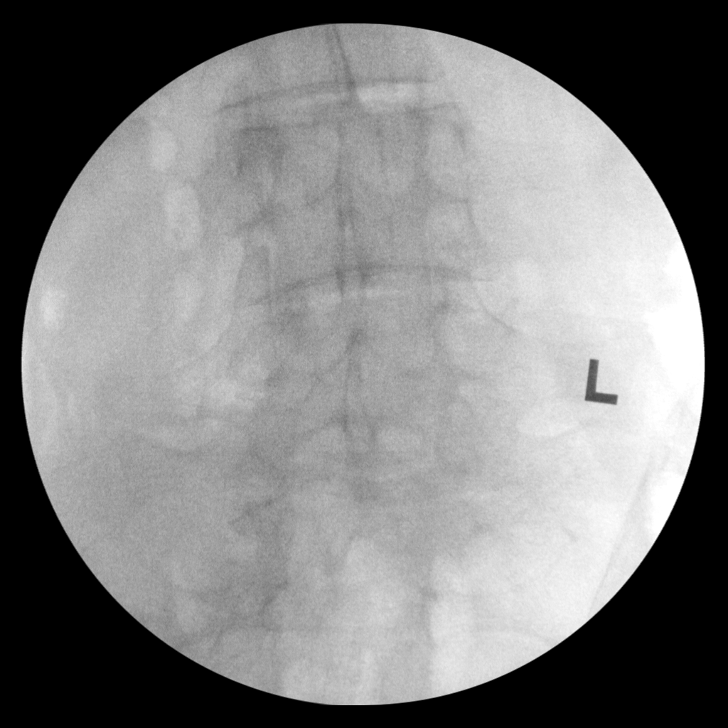
[im 4/10]
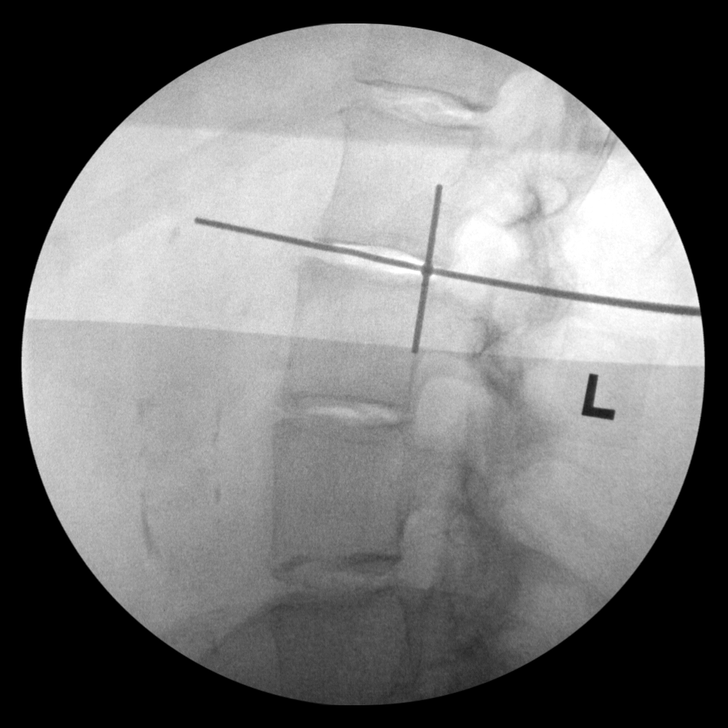
[im 5/10]
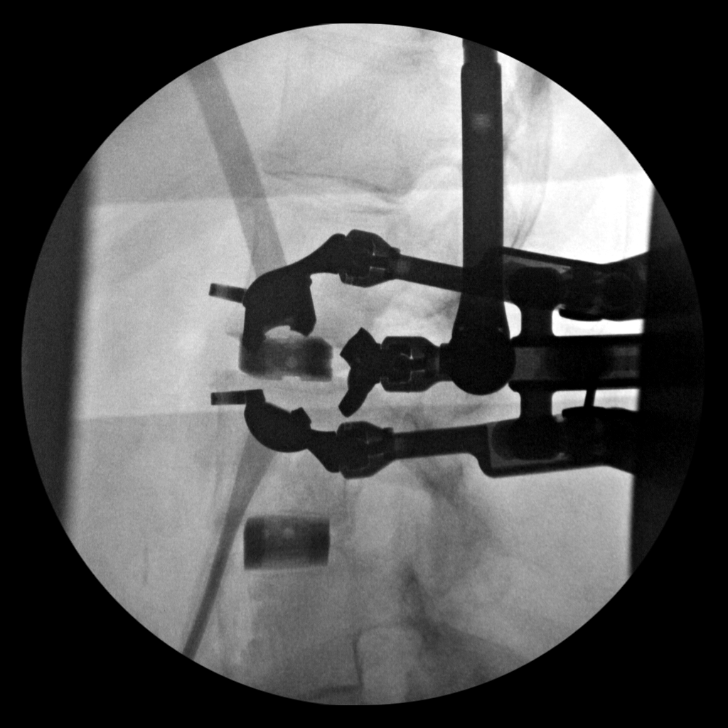
[im 6/10]
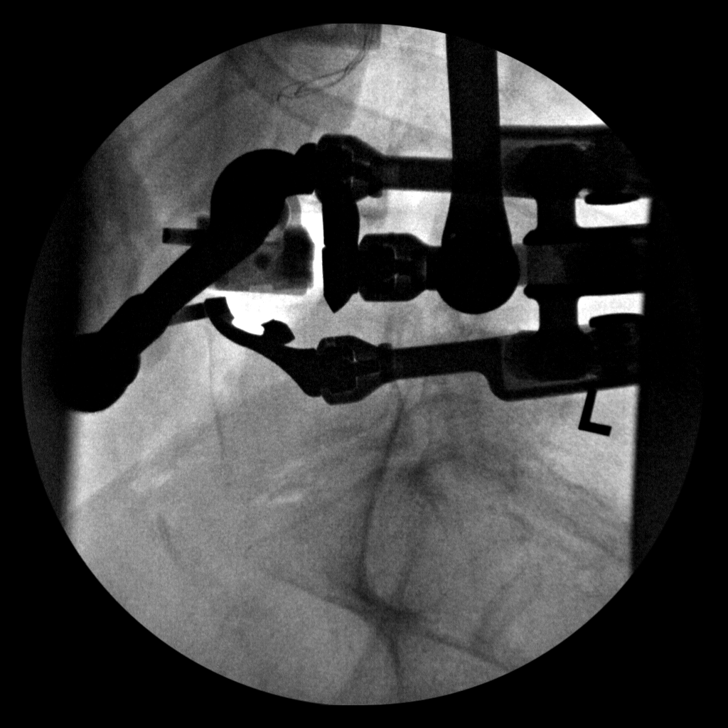
[im 7/10]
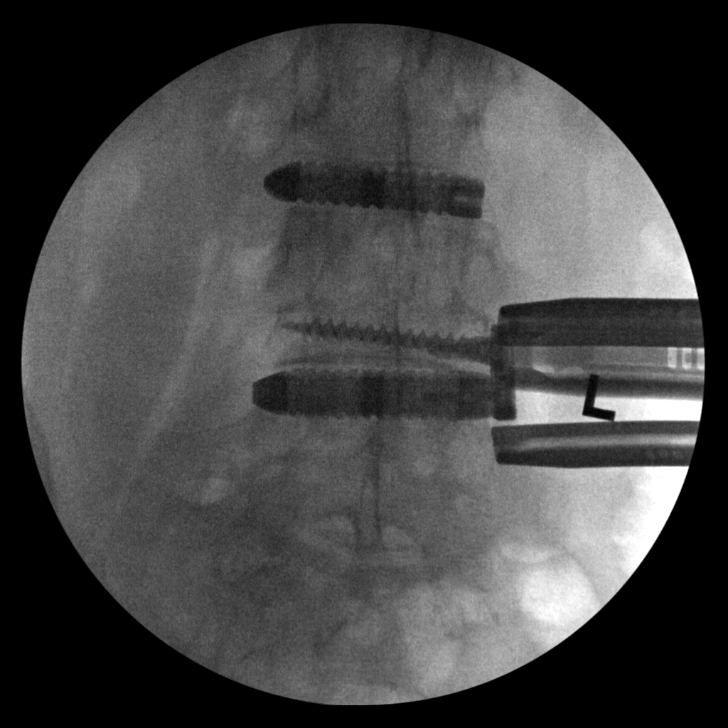
[im 8/10]
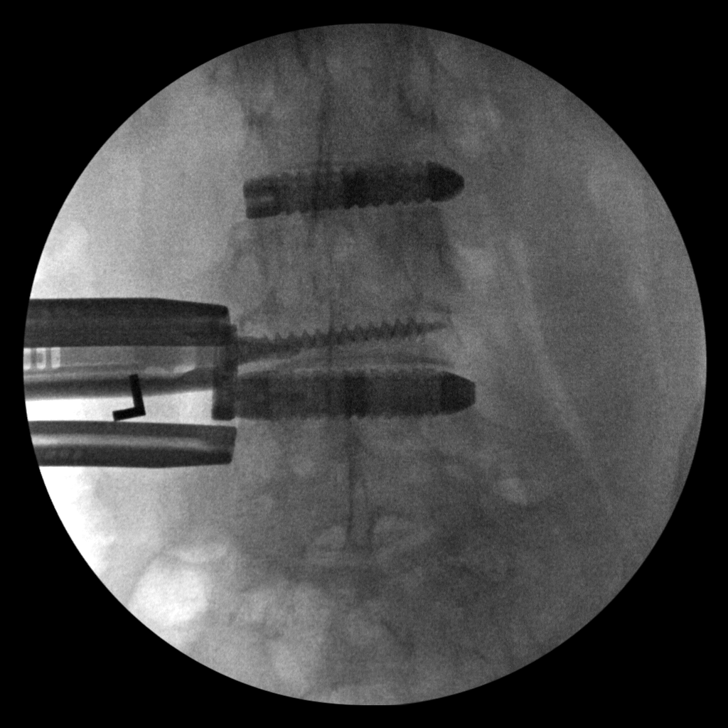
[im 9/10]
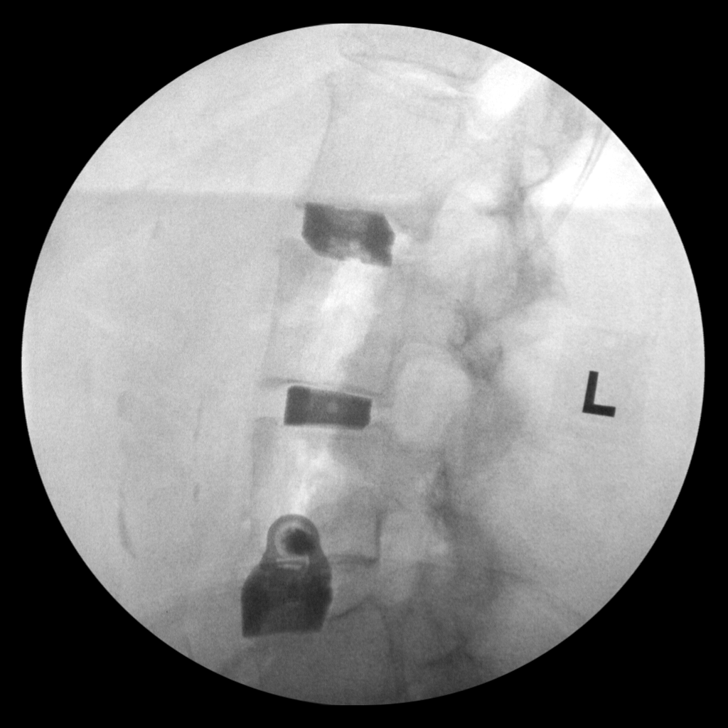
[im 10/10]
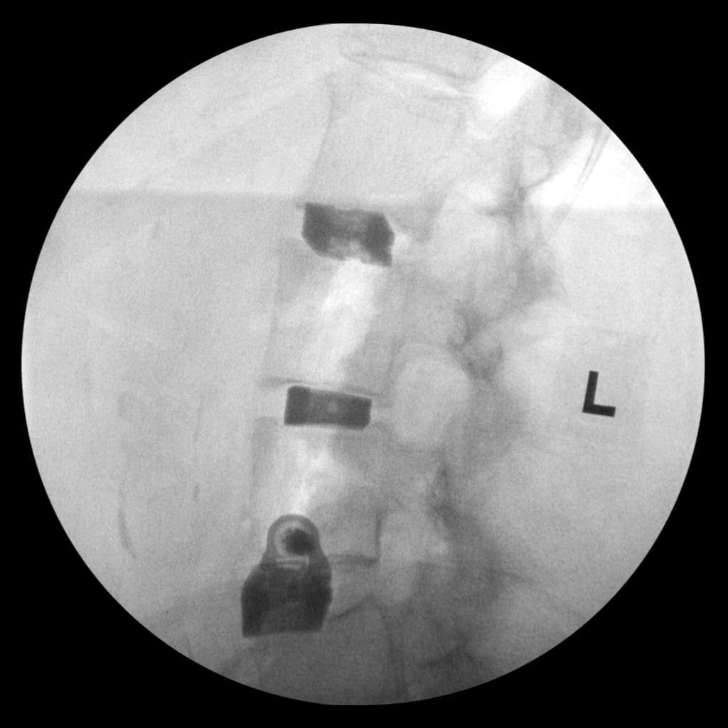

[10 of 10 positions shown; findings below may reference images not displayed]

FINDINGS: Ten C-arm fluoroscopic images were obtained intraoperatively and
submitted for post operative interpretation. These images
demonstrate lateral interbody fusion at L2-L3, L3-L4, and L4-L5 with
left lateral screw at L4-L5. No unexpected findings. No unexpected
foreign bodies. Please see the performing provider's procedural
report for further detail.
IMPRESSION: Intraoperative fluoroscopy, as detailed above.

## 2020-09-11 SURGERY — POSTERIOR LUMBAR FUSION 3 LEVEL
Anesthesia: General | Site: Spine Lumbar

## 2020-09-11 MED ORDER — ENOXAPARIN SODIUM 40 MG/0.4ML IJ SOSY: 40.0000 mg | PREFILLED_SYRINGE | INTRAMUSCULAR | Status: DC

## 2020-09-11 MED ORDER — THROMBIN 5000 UNITS EX SOLR
CUTANEOUS | Status: AC
  Filled 2020-09-11: qty 5000

## 2020-09-11 MED ORDER — BUPIVACAINE LIPOSOME 1.3 % IJ SUSP
INTRAMUSCULAR | Status: AC
  Filled 2020-09-11: qty 20

## 2020-09-11 MED ORDER — FENTANYL CITRATE (PF) 250 MCG/5ML IJ SOLN
INTRAMUSCULAR | Status: AC
  Filled 2020-09-11: qty 5

## 2020-09-11 MED ORDER — CEFAZOLIN SODIUM-DEXTROSE 1-4 GM/50ML-% IV SOLN
1.0000 g | Freq: Three times a day (TID) | INTRAVENOUS | Status: AC
  Administered 2020-09-12 (×3): 1 g via INTRAVENOUS
  Filled 2020-09-11 (×4): qty 50

## 2020-09-11 MED ORDER — LACTATED RINGERS IV SOLN
INTRAVENOUS | Status: DC | PRN
  Administered 2020-09-11: 1000 mL via INTRAVENOUS

## 2020-09-11 MED ORDER — ONDANSETRON HCL 4 MG PO TABS: 4.0000 mg | ORAL_TABLET | Freq: Four times a day (QID) | ORAL | Status: DC | PRN

## 2020-09-11 MED ORDER — LABETALOL HCL 5 MG/ML IV SOLN
INTRAVENOUS | Status: DC | PRN
  Administered 2020-09-11 (×2): 10 mg via INTRAVENOUS

## 2020-09-11 MED ORDER — OXYCODONE HCL 5 MG PO TABS
5.0000 mg | ORAL_TABLET | ORAL | Status: DC | PRN
  Administered 2020-09-12: 5 mg via ORAL
  Filled 2020-09-11: qty 1

## 2020-09-11 MED ORDER — FLEET ENEMA 7-19 GM/118ML RE ENEM: 1.0000 | ENEMA | Freq: Once | RECTAL | Status: DC | PRN

## 2020-09-11 MED ORDER — SODIUM CHLORIDE 0.9 % IV SOLN: 0.5000 mg/kg/h | INTRAVENOUS | Status: DC

## 2020-09-11 MED ORDER — LIDOCAINE HCL (PF) 2 % IJ SOLN
INTRAMUSCULAR | Status: AC
  Filled 2020-09-11: qty 5

## 2020-09-11 MED ORDER — SPIRONOLACTONE 25 MG PO TABS
25.0000 mg | ORAL_TABLET | Freq: Every day | ORAL | Status: DC
  Administered 2020-09-12: 25 mg via ORAL
  Filled 2020-09-11: qty 1

## 2020-09-11 MED ORDER — FENTANYL CITRATE (PF) 100 MCG/2ML IJ SOLN: 25.0000 ug | INTRAMUSCULAR | Status: DC | PRN

## 2020-09-11 MED ORDER — THROMBIN 5000 UNITS EX SOLR
OROMUCOSAL | Status: DC | PRN
  Administered 2020-09-11: 5 mL via TOPICAL

## 2020-09-11 MED ORDER — SODIUM CHLORIDE 0.9 % IV SOLN
INTRAVENOUS | Status: DC | PRN
  Administered 2020-09-11: 10 ug/kg/min via INTRAVENOUS

## 2020-09-11 MED ORDER — ROCURONIUM BROMIDE 10 MG/ML (PF) SYRINGE
PREFILLED_SYRINGE | INTRAVENOUS | Status: AC
  Filled 2020-09-11: qty 10

## 2020-09-11 MED ORDER — CEFAZOLIN SODIUM-DEXTROSE 2-4 GM/100ML-% IV SOLN
2.0000 g | INTRAVENOUS | Status: AC
  Administered 2020-09-11 (×3): 2 g via INTRAVENOUS

## 2020-09-11 MED ORDER — SODIUM CHLORIDE 0.9% FLUSH
3.0000 mL | Freq: Two times a day (BID) | INTRAVENOUS | Status: DC
  Administered 2020-09-11 – 2020-09-12 (×3): 3 mL via INTRAVENOUS

## 2020-09-11 MED ORDER — LACTATED RINGERS IV SOLN: INTRAVENOUS | Status: DC | PRN

## 2020-09-11 MED ORDER — CEFAZOLIN SODIUM 1 G IJ SOLR
INTRAMUSCULAR | Status: AC
  Filled 2020-09-11: qty 20

## 2020-09-11 MED ORDER — ONDANSETRON HCL 4 MG/2ML IJ SOLN: 4.0000 mg | Freq: Once | INTRAMUSCULAR | Status: DC | PRN

## 2020-09-11 MED ORDER — PROPOFOL 500 MG/50ML IV EMUL
INTRAVENOUS | Status: DC | PRN
  Administered 2020-09-11: 90 ug/kg/min via INTRAVENOUS
  Administered 2020-09-11: 50 ug/kg/min via INTRAVENOUS
  Administered 2020-09-11: 90 ug/kg/min via INTRAVENOUS

## 2020-09-11 MED ORDER — FENTANYL CITRATE (PF) 100 MCG/2ML IJ SOLN
INTRAMUSCULAR | Status: DC | PRN
  Administered 2020-09-11: 25 ug via INTRAVENOUS
  Administered 2020-09-11: 50 ug via INTRAVENOUS
  Administered 2020-09-11: 150 ug via INTRAVENOUS
  Administered 2020-09-11 (×5): 50 ug via INTRAVENOUS
  Administered 2020-09-11: 25 ug via INTRAVENOUS
  Administered 2020-09-11: 50 ug via INTRAVENOUS
  Administered 2020-09-11 (×2): 100 ug via INTRAVENOUS
  Administered 2020-09-11: 50 ug via INTRAVENOUS

## 2020-09-11 MED ORDER — PROPOFOL 1000 MG/100ML IV EMUL
INTRAVENOUS | Status: AC
  Filled 2020-09-11: qty 100

## 2020-09-11 MED ORDER — LIDOCAINE-EPINEPHRINE 1 %-1:100000 IJ SOLN
INTRAMUSCULAR | Status: DC | PRN
  Administered 2020-09-11: 10 mL
  Administered 2020-09-11: 4.5 mL

## 2020-09-11 MED ORDER — ALBUTEROL SULFATE HFA 108 (90 BASE) MCG/ACT IN AERS
INHALATION_SPRAY | RESPIRATORY_TRACT | Status: DC | PRN
  Administered 2020-09-11: 2 via RESPIRATORY_TRACT

## 2020-09-11 MED ORDER — PROPOFOL 10 MG/ML IV BOLUS
INTRAVENOUS | Status: AC
  Filled 2020-09-11: qty 20

## 2020-09-11 MED ORDER — LISINOPRIL 20 MG PO TABS
20.0000 mg | ORAL_TABLET | Freq: Every day | ORAL | Status: DC
  Administered 2020-09-12: 20 mg via ORAL
  Filled 2020-09-11: qty 1

## 2020-09-11 MED ORDER — MIDAZOLAM HCL 5 MG/5ML IJ SOLN
INTRAMUSCULAR | Status: DC | PRN
  Administered 2020-09-11: 2 mg via INTRAVENOUS

## 2020-09-11 MED ORDER — OXYCODONE HCL 5 MG/5ML PO SOLN: 5.0000 mg | Freq: Once | ORAL | Status: DC | PRN

## 2020-09-11 MED ORDER — ACETAMINOPHEN 160 MG/5ML PO SOLN: 325.0000 mg | ORAL | Status: DC | PRN

## 2020-09-11 MED ORDER — OXYCODONE HCL 5 MG PO TABS
5.0000 mg | ORAL_TABLET | Freq: Once | ORAL | Status: DC | PRN
Start: 2020-09-11 — End: 2020-09-11

## 2020-09-11 MED ORDER — EPHEDRINE 5 MG/ML INJ
INTRAVENOUS | Status: AC
  Filled 2020-09-11: qty 10

## 2020-09-11 MED ORDER — SUCCINYLCHOLINE CHLORIDE 200 MG/10ML IV SOSY
PREFILLED_SYRINGE | INTRAVENOUS | Status: DC | PRN
Start: 2020-09-11 — End: 2020-09-12
  Administered 2020-09-11: 200 mg via INTRAVENOUS

## 2020-09-11 MED ORDER — DEXAMETHASONE SODIUM PHOSPHATE 10 MG/ML IJ SOLN
INTRAMUSCULAR | Status: AC
  Filled 2020-09-11: qty 1

## 2020-09-11 MED ORDER — CHLORHEXIDINE GLUCONATE 0.12 % MT SOLN
OROMUCOSAL | Status: AC
  Administered 2020-09-11: 15 mL via OROMUCOSAL
  Filled 2020-09-11: qty 15

## 2020-09-11 MED ORDER — AMLODIPINE BESYLATE 5 MG PO TABS
5.0000 mg | ORAL_TABLET | Freq: Every day | ORAL | Status: DC
  Administered 2020-09-12: 5 mg via ORAL
  Filled 2020-09-11: qty 1

## 2020-09-11 MED ORDER — KETAMINE HCL 100 MG/ML IJ SOLN
INTRAMUSCULAR | Status: AC
  Filled 2020-09-11: qty 1

## 2020-09-11 MED ORDER — MEPERIDINE HCL 25 MG/ML IJ SOLN: 6.2500 mg | INTRAMUSCULAR | Status: DC | PRN

## 2020-09-11 MED ORDER — ACETAMINOPHEN 650 MG RE SUPP: 650.0000 mg | RECTAL | Status: DC | PRN

## 2020-09-11 MED ORDER — SUGAMMADEX SODIUM 200 MG/2ML IV SOLN
INTRAVENOUS | Status: DC | PRN
  Administered 2020-09-11: 220 mg via INTRAVENOUS

## 2020-09-11 MED ORDER — ROCURONIUM BROMIDE 10 MG/ML (PF) SYRINGE
PREFILLED_SYRINGE | INTRAVENOUS | Status: DC | PRN
  Administered 2020-09-11 (×4): 10 mg via INTRAVENOUS
  Administered 2020-09-11: 30 mg via INTRAVENOUS
  Administered 2020-09-11: 100 mg via INTRAVENOUS
  Administered 2020-09-11: 20 mg via INTRAVENOUS

## 2020-09-11 MED ORDER — OXYCODONE HCL 5 MG PO TABS
10.0000 mg | ORAL_TABLET | ORAL | Status: DC | PRN
  Administered 2020-09-11 – 2020-09-13 (×6): 10 mg via ORAL
  Filled 2020-09-11 (×6): qty 2

## 2020-09-11 MED ORDER — CYCLOBENZAPRINE HCL 10 MG PO TABS
10.0000 mg | ORAL_TABLET | Freq: Three times a day (TID) | ORAL | Status: DC | PRN
  Administered 2020-09-11 – 2020-09-12 (×2): 10 mg via ORAL
  Filled 2020-09-11 (×2): qty 1

## 2020-09-11 MED ORDER — ALBUTEROL SULFATE HFA 108 (90 BASE) MCG/ACT IN AERS
INHALATION_SPRAY | RESPIRATORY_TRACT | Status: AC
  Filled 2020-09-11: qty 6.7

## 2020-09-11 MED ORDER — ATENOLOL 25 MG PO TABS
50.0000 mg | ORAL_TABLET | Freq: Every day | ORAL | Status: DC
  Administered 2020-09-12: 50 mg via ORAL
  Filled 2020-09-11: qty 2

## 2020-09-11 MED ORDER — MIDAZOLAM HCL 2 MG/2ML IJ SOLN
INTRAMUSCULAR | Status: AC
  Filled 2020-09-11: qty 2

## 2020-09-11 MED ORDER — BUPIVACAINE HCL (PF) 0.5 % IJ SOLN
INTRAMUSCULAR | Status: AC
  Filled 2020-09-11: qty 30

## 2020-09-11 MED ORDER — PROPOFOL 10 MG/ML IV BOLUS
INTRAVENOUS | Status: DC | PRN
Start: 2020-09-11 — End: 2020-09-12
  Administered 2020-09-11: 40 mg via INTRAVENOUS
  Administered 2020-09-11: 160 mg via INTRAVENOUS
  Administered 2020-09-11: 30 mg via INTRAVENOUS

## 2020-09-11 MED ORDER — SUCCINYLCHOLINE CHLORIDE 200 MG/10ML IV SOSY
PREFILLED_SYRINGE | INTRAVENOUS | Status: AC
  Filled 2020-09-11: qty 10

## 2020-09-11 MED ORDER — CHLORHEXIDINE GLUCONATE 0.12 % MT SOLN: 15.0000 mL | Freq: Once | OROMUCOSAL | Status: AC

## 2020-09-11 MED ORDER — KETAMINE HCL 10 MG/ML IJ SOLN: INTRAMUSCULAR | Status: DC | PRN

## 2020-09-11 MED ORDER — ONDANSETRON HCL 4 MG/2ML IJ SOLN: 4.0000 mg | Freq: Four times a day (QID) | INTRAMUSCULAR | Status: DC | PRN

## 2020-09-11 MED ORDER — SODIUM CHLORIDE 0.9 % IV SOLN
250.0000 mL | INTRAVENOUS | Status: DC
  Administered 2020-09-11: 250 mL via INTRAVENOUS

## 2020-09-11 MED ORDER — EPHEDRINE SULFATE-NACL 50-0.9 MG/10ML-% IV SOSY
PREFILLED_SYRINGE | INTRAVENOUS | Status: DC | PRN
Start: 2020-09-11 — End: 2020-09-12
  Administered 2020-09-11 (×2): 10 mg via INTRAVENOUS
  Administered 2020-09-11: 5 mg via INTRAVENOUS

## 2020-09-11 MED ORDER — ARTIFICIAL TEARS OPHTHALMIC OINT
TOPICAL_OINTMENT | OPHTHALMIC | Status: AC
  Filled 2020-09-11: qty 3.5

## 2020-09-11 MED ORDER — HYDROMORPHONE HCL 1 MG/ML IJ SOLN
0.5000 mg | INTRAMUSCULAR | Status: DC | PRN
  Administered 2020-09-12: 0.5 mg via INTRAVENOUS
  Filled 2020-09-11 (×2): qty 1

## 2020-09-11 MED ORDER — KETAMINE HCL 10 MG/ML IJ SOLN
INTRAMUSCULAR | Status: DC | PRN
  Administered 2020-09-11: 10 mg via INTRAVENOUS
  Administered 2020-09-11: 30 mg via INTRAVENOUS
  Administered 2020-09-11: 10 mg via INTRAVENOUS

## 2020-09-11 MED ORDER — POTASSIUM CHLORIDE IN NACL 20-0.9 MEQ/L-% IV SOLN
INTRAVENOUS | Status: DC
  Filled 2020-09-11: qty 1000

## 2020-09-11 MED ORDER — LACTATED RINGERS IV SOLN: INTRAVENOUS | Status: DC

## 2020-09-11 MED ORDER — ALPRAZOLAM 0.25 MG PO TABS
0.2500 mg | ORAL_TABLET | Freq: Two times a day (BID) | ORAL | Status: DC | PRN
  Administered 2020-09-11 – 2020-09-12 (×2): 0.25 mg via ORAL
  Filled 2020-09-11 (×2): qty 1

## 2020-09-11 MED ORDER — LIDOCAINE HCL (PF) 2 % IJ SOLN
INTRAMUSCULAR | Status: DC | PRN
  Administered 2020-09-11: 100 mg via INTRADERMAL

## 2020-09-11 MED ORDER — VENLAFAXINE HCL ER 75 MG PO CP24
75.0000 mg | ORAL_CAPSULE | Freq: Every day | ORAL | Status: DC
  Administered 2020-09-12 – 2020-09-13 (×2): 75 mg via ORAL
  Filled 2020-09-11 (×2): qty 1

## 2020-09-11 MED ORDER — KETAMINE HCL 50 MG/5ML IJ SOSY
PREFILLED_SYRINGE | INTRAMUSCULAR | Status: AC
  Filled 2020-09-11: qty 5

## 2020-09-11 MED ORDER — BUPIVACAINE HCL (PF) 0.5 % IJ SOLN
INTRAMUSCULAR | Status: DC | PRN
  Administered 2020-09-11: 4.5 mL
  Administered 2020-09-11: 10 mL

## 2020-09-11 MED ORDER — ONDANSETRON HCL 4 MG/2ML IJ SOLN
INTRAMUSCULAR | Status: DC | PRN
  Administered 2020-09-11: 4 mg via INTRAVENOUS

## 2020-09-11 MED ORDER — PANTOPRAZOLE SODIUM 40 MG PO TBEC
40.0000 mg | DELAYED_RELEASE_TABLET | Freq: Every day | ORAL | Status: DC
  Administered 2020-09-12: 40 mg via ORAL
  Filled 2020-09-11: qty 1

## 2020-09-11 MED ORDER — 0.9 % SODIUM CHLORIDE (POUR BTL) OPTIME
TOPICAL | Status: DC | PRN
  Administered 2020-09-11 (×2): 1000 mL

## 2020-09-11 MED ORDER — ONDANSETRON HCL 4 MG/2ML IJ SOLN
INTRAMUSCULAR | Status: AC
  Filled 2020-09-11: qty 2

## 2020-09-11 MED ORDER — POLYETHYLENE GLYCOL 3350 17 G PO PACK: 17.0000 g | PACK | Freq: Every day | ORAL | Status: DC | PRN

## 2020-09-11 MED ORDER — BUPIVACAINE LIPOSOME 1.3 % IJ SUSP
INTRAMUSCULAR | Status: DC | PRN
  Administered 2020-09-11: 20 mL

## 2020-09-11 MED ORDER — ACETAMINOPHEN 325 MG PO TABS: 325.0000 mg | ORAL_TABLET | ORAL | Status: DC | PRN

## 2020-09-11 MED ORDER — LIDOCAINE-EPINEPHRINE 1 %-1:100000 IJ SOLN
INTRAMUSCULAR | Status: AC
  Filled 2020-09-11: qty 1

## 2020-09-11 MED ORDER — ALBUMIN HUMAN 5 % IV SOLN: INTRAVENOUS | Status: DC | PRN

## 2020-09-11 MED ORDER — SODIUM CHLORIDE 0.9 % IV SOLN
1.0000 mg/kg/h | INTRAVENOUS | Status: DC
  Filled 2020-09-11: qty 12.5

## 2020-09-11 MED ORDER — SODIUM CHLORIDE 0.9% FLUSH: 3.0000 mL | INTRAVENOUS | Status: DC | PRN

## 2020-09-11 MED ORDER — ORAL CARE MOUTH RINSE: 15.0000 mL | Freq: Once | OROMUCOSAL | Status: AC

## 2020-09-11 MED ORDER — PHENOL 1.4 % MT LIQD: 1.0000 | OROMUCOSAL | Status: DC | PRN

## 2020-09-11 MED ORDER — DOCUSATE SODIUM 100 MG PO CAPS
100.0000 mg | ORAL_CAPSULE | Freq: Two times a day (BID) | ORAL | Status: DC
  Administered 2020-09-11 – 2020-09-12 (×3): 100 mg via ORAL
  Filled 2020-09-11 (×3): qty 1

## 2020-09-11 MED ORDER — CEFAZOLIN SODIUM-DEXTROSE 2-4 GM/100ML-% IV SOLN
INTRAVENOUS | Status: AC
  Filled 2020-09-11: qty 100

## 2020-09-11 MED ORDER — SODIUM CHLORIDE 0.9 % IV SOLN
0.5000 mg/kg/h | Freq: Once | INTRAVENOUS | Status: DC
  Filled 2020-09-11: qty 2

## 2020-09-11 MED ORDER — MENTHOL 3 MG MT LOZG: 1.0000 | LOZENGE | OROMUCOSAL | Status: DC | PRN

## 2020-09-11 MED ORDER — PHENYLEPHRINE HCL-NACL 10-0.9 MG/250ML-% IV SOLN
INTRAVENOUS | Status: DC | PRN
  Administered 2020-09-11: 25 ug/min via INTRAVENOUS

## 2020-09-11 MED ORDER — CHLORHEXIDINE GLUCONATE CLOTH 2 % EX PADS: 6.0000 | MEDICATED_PAD | Freq: Once | CUTANEOUS | Status: DC

## 2020-09-11 MED ORDER — DEXAMETHASONE SODIUM PHOSPHATE 10 MG/ML IJ SOLN
INTRAMUSCULAR | Status: DC | PRN
Start: 2020-09-11 — End: 2020-09-12
  Administered 2020-09-11: 10 mg via INTRAVENOUS

## 2020-09-11 MED ORDER — ACETAMINOPHEN 325 MG PO TABS
650.0000 mg | ORAL_TABLET | ORAL | Status: DC | PRN
  Administered 2020-09-13 (×2): 650 mg via ORAL
  Filled 2020-09-11 (×2): qty 2

## 2020-09-11 SURGICAL SUPPLY — 100 items
ADH SKN CLS APL DERMABOND .7 (GAUZE/BANDAGES/DRESSINGS) ×4
BAND INSRT 18 STRL LF DISP RB (MISCELLANEOUS)
BAND RUBBER #18 3X1/16 STRL (MISCELLANEOUS) IMPLANT
BASKET BONE COLLECTION (BASKET) IMPLANT
BATTALION LLIF ITRADISCAL SHIM (MISCELLANEOUS) ×4
BLADE CLIPPER SURG (BLADE) IMPLANT
BUR CARBIDE MATCH 3.0 (BURR) IMPLANT
BUR MATCHSTICK NEURO 3.0 LAGG (BURR) ×2 IMPLANT
BUR PRECISION FLUTE 5.0 (BURR) IMPLANT
CANISTER SUCT 3000ML PPV (MISCELLANEOUS) ×4 IMPLANT
CLIP SPRING STIM LLIF SAFEOP (CLIP) ×2 IMPLANT
CNTNR URN SCR LID CUP LEK RST (MISCELLANEOUS) ×2 IMPLANT
CONT SPEC 4OZ STRL OR WHT (MISCELLANEOUS) ×4
COVER BACK TABLE 60X90IN (DRAPES) ×4 IMPLANT
COVER WAND RF STERILE (DRAPES) ×2 IMPLANT
DECANTER SPIKE VIAL GLASS SM (MISCELLANEOUS) ×2 IMPLANT
DERMABOND ADVANCED (GAUZE/BANDAGES/DRESSINGS) ×4
DERMABOND ADVANCED .7 DNX12 (GAUZE/BANDAGES/DRESSINGS) ×2 IMPLANT
DILATOR INSULATED LLIF 8-13-18 (NEUROSURGERY SUPPLIES) ×2 IMPLANT
DRAIN JACKSON PRATT 10MM FLAT (MISCELLANEOUS) IMPLANT
DRAPE 3/4 80X56 (DRAPES) ×4 IMPLANT
DRAPE C-ARM 42X72 X-RAY (DRAPES) ×8 IMPLANT
DRAPE C-ARMOR (DRAPES) ×4 IMPLANT
DRAPE LAPAROTOMY 100X72X124 (DRAPES) ×6 IMPLANT
DRAPE MICROSCOPE LEICA (MISCELLANEOUS) IMPLANT
DRSG OPSITE POSTOP 4X6 (GAUZE/BANDAGES/DRESSINGS) ×2 IMPLANT
DRSG OPSITE POSTOP 4X8 (GAUZE/BANDAGES/DRESSINGS) ×4 IMPLANT
DURAPREP 26ML APPLICATOR (WOUND CARE) ×6 IMPLANT
ELECT BLADE INSULATED 6.5IN (ELECTROSURGICAL) ×4
ELECT KIT SAFEOP SSEP/SURF (KITS) ×4
ELECT REM PT RETURN 9FT ADLT (ELECTROSURGICAL) ×4
ELECTRODE BLDE INSULATED 6.5IN (ELECTROSURGICAL) ×2 IMPLANT
ELECTRODE KT SAFEOP SSEP/SURF (KITS) IMPLANT
ELECTRODE REM PT RTRN 9FT ADLT (ELECTROSURGICAL) ×2 IMPLANT
EVACUATOR SILICONE 100CC (DRAIN) IMPLANT
EXTENDER TAB GUIDE SV 5.5/6.0 (INSTRUMENTS) ×32 IMPLANT
GAUZE 4X4 16PLY RFD (DISPOSABLE) ×6 IMPLANT
GAUZE SPONGE 4X4 12PLY STRL (GAUZE/BANDAGES/DRESSINGS) IMPLANT
GLOVE BIOGEL PI IND STRL 7.5 (GLOVE) ×2 IMPLANT
GLOVE BIOGEL PI INDICATOR 7.5 (GLOVE) ×2
GLOVE ECLIPSE 7.5 STRL STRAW (GLOVE) ×4 IMPLANT
GLOVE EXAM NITRILE XL STR (GLOVE) IMPLANT
GLOVE SURG UNDER POLY LF SZ7 (GLOVE) ×2 IMPLANT
GOWN STRL REUS W/ TWL LRG LVL3 (GOWN DISPOSABLE) ×2 IMPLANT
GOWN STRL REUS W/ TWL XL LVL3 (GOWN DISPOSABLE) ×2 IMPLANT
GOWN STRL REUS W/TWL 2XL LVL3 (GOWN DISPOSABLE) IMPLANT
GOWN STRL REUS W/TWL LRG LVL3 (GOWN DISPOSABLE) ×4
GOWN STRL REUS W/TWL XL LVL3 (GOWN DISPOSABLE) ×4
GUIDEWIRE LLIF TT 320 (WIRE) ×4 IMPLANT
HEMOSTAT POWDER KIT SURGIFOAM (HEMOSTASIS) ×4 IMPLANT
KIT BASIN OR (CUSTOM PROCEDURE TRAY) ×4 IMPLANT
KIT INFUSE X SMALL 1.4CC (Orthopedic Implant) ×2 IMPLANT
KIT INFUSE XX SMALL 0.7CC (Orthopedic Implant) ×2 IMPLANT
KIT TURNOVER KIT B (KITS) ×4 IMPLANT
KNIFE ANNULOTOMY GREY RETRACT (ORTHOPEDIC DISPOSABLE SUPPLIES) ×2 IMPLANT
LIF ILLUMINATION SYSTEM STERIL (SYSTAGENIX WOUND MANAGEMENT) ×4
MARKER SKIN DUAL TIP RULER LAB (MISCELLANEOUS) ×4 IMPLANT
MATRIX SPINE STRIP NEOCORE 5CC (Putty) IMPLANT
MATRIX STRIP NEOCORE 12C (Putty) IMPLANT
MILL MEDIUM DISP (BLADE) ×2 IMPLANT
NDL HYPO 18GX1.5 BLUNT FILL (NEEDLE) IMPLANT
NDL HYPO 21X1.5 SAFETY (NEEDLE) IMPLANT
NDL SPNL 18GX3.5 QUINCKE PK (NEEDLE) IMPLANT
NEEDLE HYPO 18GX1.5 BLUNT FILL (NEEDLE) ×4 IMPLANT
NEEDLE HYPO 21X1.5 SAFETY (NEEDLE) IMPLANT
NEEDLE HYPO 22GX1.5 SAFETY (NEEDLE) ×4 IMPLANT
NEEDLE SPNL 18GX3.5 QUINCKE PK (NEEDLE) IMPLANT
NS IRRIG 1000ML POUR BTL (IV SOLUTION) ×6 IMPLANT
PACK LAMINECTOMY NEURO (CUSTOM PROCEDURE TRAY) ×6 IMPLANT
PAD ARMBOARD 7.5X6 YLW CONV (MISCELLANEOUS) ×12 IMPLANT
PLATE LIF AMP 6X15 W/SCREW (Plate) ×2 IMPLANT
PROBE BALL TIP LLIF SAFEOP (NEUROSURGERY SUPPLIES) ×2 IMPLANT
PUTTY GRAFTON DBF 6CC W/DELIVE (Putty) ×2 IMPLANT
ROD 5.5 TI AL STRT PERC 110 (Rod) ×2 IMPLANT
ROD PERC AL 5.5X110 (Rod) ×2 IMPLANT
SCREW LIF AMP 5.5X45 BONE X2 (Screw) ×2 IMPLANT
SCREW MAS VOYAGER 5.5X45 (Screw) ×6 IMPLANT
SCREW MAS VOYAGER 7.5X45 (Screw) ×2 IMPLANT
SCREW SET 5.5/6.0MM SOLERA (Screw) ×16 IMPLANT
SCREW SPINAL IFIX 6.5X45 (Screw) ×8 IMPLANT
SHIM ITRADISCAL BATTALION LLIF (MISCELLANEOUS) IMPLANT
SPACER IDENTITI 8X18X45 0D (Spacer) ×2 IMPLANT
SPACER IDENTITI LIF 8X18X50 10 (Spacer) ×2 IMPLANT
SPACER LIF IDENTITI 6X18X45 10 (Spacer) ×2 IMPLANT
SPONGE LAP 4X18 RFD (DISPOSABLE) IMPLANT
SPONGE SURGIFOAM ABS GEL 100 (HEMOSTASIS) IMPLANT
STRIP MATRIX NEOCORE 12CC (Putty) ×2 IMPLANT
STRIP MATRIX NEOCORE 5CC (Putty) ×2 IMPLANT
SUT MNCRL AB 4-0 PS2 18 (SUTURE) ×6 IMPLANT
SUT VIC AB 0 CT1 18XCR BRD8 (SUTURE) ×2 IMPLANT
SUT VIC AB 0 CT1 8-18 (SUTURE) ×4
SUT VIC AB 1 CT1 18XBRD ANBCTR (SUTURE) IMPLANT
SUT VIC AB 1 CT1 8-18 (SUTURE) ×8
SUT VIC AB 2-0 CP2 18 (SUTURE) ×8 IMPLANT
SYR 30ML LL (SYRINGE) ×4 IMPLANT
SYSTEM ILLUMINATION LIF STERIL (SYSTAGENIX WOUND MANAGEMENT) IMPLANT
TOWEL GREEN STERILE (TOWEL DISPOSABLE) ×4 IMPLANT
TOWEL GREEN STERILE FF (TOWEL DISPOSABLE) ×4 IMPLANT
TRAY FOLEY MTR SLVR 16FR STAT (SET/KITS/TRAYS/PACK) ×4 IMPLANT
WATER STERILE IRR 1000ML POUR (IV SOLUTION) ×4 IMPLANT

## 2020-09-11 NOTE — Anesthesia Procedure Notes (Signed)
Procedure Name: Intubation Date/Time: 09/11/2020 8:02 AM Performed by: Jenne Campus, CRNA Pre-anesthesia Checklist: Patient identified, Emergency Drugs available, Suction available and Patient being monitored Patient Re-evaluated:Patient Re-evaluated prior to induction Oxygen Delivery Method: Circle System Utilized Preoxygenation: Pre-oxygenation with 100% oxygen Induction Type: IV induction Ventilation: Mask ventilation without difficulty Laryngoscope Size: Miller and 3 Grade View: Grade I Tube type: Oral Tube size: 7.5 mm Number of attempts: 1 Airway Equipment and Method: Stylet Placement Confirmation: ETT inserted through vocal cords under direct vision, positive ETCO2 and breath sounds checked- equal and bilateral Secured at: 22 cm Tube secured with: Tape Dental Injury: Teeth and Oropharynx as per pre-operative assessment

## 2020-09-11 NOTE — Anesthesia Procedure Notes (Signed)
Arterial Line Insertion Start/End6/16/2022 7:15 AM, 09/11/2020 7:25 AM Performed by: Jenne Campus, CRNA, CRNA  Patient location: Pre-op. Preanesthetic checklist: patient identified, IV checked, risks and benefits discussed, surgical consent, monitors and equipment checked, pre-op evaluation and timeout performed Lidocaine 1% used for infiltration Left, radial was placed Catheter size: 20 G Hand hygiene performed  and maximum sterile barriers used   Attempts: 1 Procedure performed without using ultrasound guided technique. Following insertion, dressing applied and Biopatch. Post procedure assessment: normal  Patient tolerated the procedure well with no immediate complications.

## 2020-09-11 NOTE — Progress Notes (Signed)
Patient arrived on the unit from PACU, alert, but sleepy. Responsive to  commands. Neuro checks intact. Move all extremities. Vital signs within normal.Honeycomb dressing  on mid back and left flank intact, old drainage noted and marked.  Will continue monitoring.

## 2020-09-11 NOTE — Op Note (Addendum)
Procedure(s): Anterior Lateral Lumbar Interbody Fusion Lumbar Two-Three, Lumbar Three-Four, Lumbar four-five Minimally invasive fusion Lumbar four-five, Lumbar five-Sacral one Procedure Note  Susan Franco female 60 y.o. 09/11/2020  Procedure(s) and Anesthesia Type:    * Anterior Lateral Lumbar Interbody Fusion Lumbar Two-Three, Lumbar Three-Four, Lumbar four-five - General    * Minimally invasive fusion Lumbar four-five, Lumbar five-Sacral one - General  Surgeon(s) and Role:    Marcello Moores, Dorcas Carrow, MD - Primary    Consuella Lose, MD - Assisting   Indications: This is a 60 year old woman with psoriatic arthritis who developed progressive severe mechanical back pain as well as progressive neurogenic claudication.  She was found to have severe degenerative disc disease with coronal imbalance at L2-3, spondylolisthesis at L3-4 with mild dynamic instability, and severe stenosis at L3-4 and L4-5, from combination of epidural lipomatosis and hypertrophied ligamentum flavum as well as broad-based disc bulge.  Previous pain management and physical therapy did not help and her symptoms progressed to the point where she was very functionally debilitated.  I had a long discussion with her and her family regarding treatment options.  Given the severity of her symptoms, I recommended surgery to help address both her mechanical back pain and her stenosis symptoms.  I discussed with her that with her psoriatic arthritis as well as osteopenia, she is at increased risk of complications from surgery so performing a more limited surgery focusing on her 3 segments of symptomatic disease was recommended over a larger scale deformity correction surgery.  I felt that a combination of a lateral approach and posterior approach would allow good correction of her coronal and sagittal imbalance, help minimize interbody subsidence, and allow for decompression of her neural elements.  Risks, benefits, alternatives, and  expected convalescence were discussed.  Risk discussed included, but were not limited to, bleeding, pain, infection, scar, pseudoarthrosis, adjacent segment disease, neurologic deficit, spinal fluid leak, injury to nearby organs, coma, and death.  Informed consent was obtained.     Surgeon: Vallarie Mare   Assistants: Consuella Lose, MD.  Please note there were no qualified trainees available to assist with the surgery.  Dr. Kathyrn Sheriff aided in exposure for the lateral approach and placed the percutaneous pedicle screws and performed posterolateral arthrodesis on the left side.  Anesthesia: General endotracheal anesthesia  Procedure Detail  Direct lateral interbody fusion with retroperitoneal approach, L2-3 Direct lateral interbody fusion with retroperitoneal approach, L3-4 Direct lateral interbody fusion with retroperitoneal approach, L4-5 Posterior arthrodesis L2-3, L3-4, L4-5 Right L4-5 posterior sublaminar decompression, foraminotomy and facetectomy using tubular retractor Right L3-4 posterior sublaminar decompression, foraminotomy and partial facetectomy using tubular retractor Segmental instrumentation with pedicle screw and rod construct L2-3-4-5 8.   Placement of interbody cages via lateral approach at L2-3, L3-4, L4-5 9.  Placement of lateral plate L4-5 10.   Harvest of 12th rib for local autograft 11.  Use of morselized osteopromotive material 12. Use of microscope for intraoperative microdissection 13.  Intraoperative CT and neuronavigation with Medtronic O-arm and Stealth  The patient was brought to the operating.  After general anesthesia was induced, patient was intubated by the anesthesia service.  After appropriate lines and monitors were placed, patient was positioned in the lateral position with the left side up.  All pressure points were padded.  She was taped to the bed and x-ray confirmed good lateral positioning.  Baseline neuro monitoring was obtained.  Using  x-ray, her disc bases were outlined over her flank which was prepped  and draped in sterile fashion.  A timeout was performed.  Preoperative antibiotics and dexamethasone were administered.  A curvilinear incision was made spanning her 3 discs.  Subcutaneous tissue was divided and the fascia was opened.  The external oblique, and internal oblique and transversalis fascia were opened bluntly and the retroperitoneum was entered.  Blunt dissection was performed to reflect the abdominal contents anteriorly.  The quadratus lumborum and transverse processes of L3, L4, and L5 were palpated as well as the psoas.  Using lateral x-ray, the initial dilator was placed over the psoas muscle at L3-4.  After neuromonitoring with stimulation confirmed no nearby nerves, the dilator was passed down to the disc space.  Neuro monitoring confirmed no nearby nerves and the dilator was secured with a K wire.  Sequential dilation and stimulation was performed and a retractor was then placed over the disc space and secured to the bed.  C-arm x-ray confirmed excellent position of the retractor.  Direct stimulation confirmed there was no nerves in the field and near the posterior blade which was secured with a shim.  Retractor was then opened and again direct stimulation confirmed no nerves in the field.  Annulotomy was performed with a 15 blade.  Discectomy was performed with box cutters and rongeurs and the endplates were prepared with Cobb's and ringed curettes.  Sequentially increasing size trials were placed to help open up the collapsed disc space.  An appropriate sized interbody was selected and placed using lateral and AP fluoroscopy.  Meticulous hemostasis was obtained and the shim and the retractor were withdrawn.  Attention was then turned to the L2-3 disc.  She had a low hanging 12th rib which prevented good docking at L2-3.  The intercostal muscles were dissected off of the rib and the rib was resected to its anterior tip and  harvested for autograft.  No pleural intrusion was evident. Using lateral x-ray, the initial dilator was placed over the psoas muscle at L2-3.  After neuromonitoring with stimulation confirmed no nearby nerves, the dilator was passed down to the disc space.  Neuro monitoring confirmed no nearby nerves and the dilator was secured with a K wire.  Sequential dilation and stimulation was performed and a retractor was then placed over the disc space and secured to the bed.  C-arm x-ray confirmed excellent position of the retractor.  Direct stimulation confirmed there was no nerves in the field and near the posterior blade which was secured with a shim.  Retractor was then opened and again direct stimulation confirmed no nerves in the field.  Annulotomy was performed with a 15 blade.  Discectomy was performed with box cutters and rongeurs and the endplates were prepared with Cobb's and ringed curettes.  There was a fair amount of inflammatory changes at this interspace on her preoperative MRI but the disc itself appeared to be just a degenerated disc as suspected.  Sequentially increasing size trials were placed to help open up the collapsed disc space.  An appropriate sized interbody was selected and placed using lateral and AP fluoroscopy.  Meticulous hemostasis was obtained and the shim and the retractor were withdrawn.  Attention was then turned to the L4-5 disc.  She had a somewhat high iliac crest which required some slightly inferior angulation to the disc space but it appeared accessible. Using lateral x-ray, the initial dilator was placed over the psoas muscle at L4-5.  After neuromonitoring with stimulation confirmed no nearby nerves, the dilator was passed down to the disc  space.  Neuro monitoring confirmed no nearby nerves and the dilator was secured with a K wire.  Sequential dilation and stimulation was performed and a retractor was then placed over the disc space and secured to the bed.  C-arm x-ray  confirmed excellent position of the retractor.  Direct stimulation confirmed there was no nerves in the field and near the posterior blade which was secured with a shim.  Retractor was then opened and again direct stimulation confirmed no nerves in the field.  Annulotomy was performed with a 15 blade.  Discectomy was performed with box cutters and rongeurs and the endplates were prepared with Cobb's and ringed curettes.  Sequentially increasing size trials were placed to help open up the collapsed disc space.  To help increase lordosis, the implant was placed in the anterior aspect of the disc space.  However increased laxity was noted.  An appropriate sized interbody was selected and placed using lateral and AP fluoroscopy.  As we were suspecting an ALL tear, we placed a lateral plate which integrated with the cage and was secured to the L4 body with a screw which was placed under AP and lateral x-ray guidance.  The implant was thus secured against migration.  Meticulous hemostasis was obtained and the shim and the retractor were withdrawn  There were no changes in neuro monitoring throughout the lateral portion of the procedure.  The fascia was then closed with 0 Vicryl stitches.  This subcutaneous layer and dermal layer was closed with 2-0 Vicryl stitches.  The skin was closed with 4 Monocryl in subcuticular manner followed by Dermabond.  Patient was then carefully flipped prone on a Jackson table with all pressure points padded and eyes protected.  Her lower back was preprepped with alcohol and prepped and draped in sterile fashion.  A second timeout was performed.  A PSIS pin was placed for registration and a Medtronic O-arm was used to perform an intraoperative CT.  This allowed for intraoperative neuro navigation.  Using the O-arm, appropriate paramedian skin entry points for pedicle screws at L2, L3, L4, and L5 were chosen and incisions were made with 10 blade bilaterally.  The fascia was opened sharply.   Using a navigated Midas rex drill, the pedicles were cannulated at L2, L3, L4, and L5 bilaterally with navigated awl tip tap used to cannulate the pedicles.  The pedicle paths were then felt with ball ended probe and confirmed intraosseous location.  Pedicle screws were then placed on the left side, with sizes selected appropriately. Using navigated drill, facets were decorticated on the left side and morselized allograft placed in the lateral gutters.    On the right side, the screw paths were cannulated with the awl tip tap.  Then using the neuro navigation, a initial dilator was placed at the L4 lamina on the right side and sequential dilators placed and a tubular retractor placed and secured to the skin.  Right-sided laminotomy going under the spinous process was performed with a high-speed drill and sublaminar decompression with removal of the ligamentum flavum was performed to decompress the thecal sac.  There was copious epidural lipomatosis which was contributing a fair amount to her stenosis.  The tubular retractor was then wanded more laterally and a facetectomy performed, exposing the traversing nerve root.  The disc space in the foramen was identified.  The epidural space was probed above the disc space and in the anterior epidural space.  No significant stenosis was felt and no large free fragment  was discovered.  Good decompression was confirmed with easy passage of ball ended probe contralaterally and in the foramen.  Attention was then turned to L3-4.  Using neuro navigation, initial dilator was placed at the L3 lamina on the right side.sequential dilators placed and a tubular retractor placed and secured to the skin.  Right-sided laminotomy going under the spinous process was performed with a high-speed drill and sublaminar decompression with removal of the ligamentum flavum was performed to decompress the thecal sac.  There was copious epidural lipomatosis which was contributing a fair amount to  her stenosis.  The tubular retractor was then wanded more laterally and a medial facetectomy performed, exposing the traversing nerve root.  Good decompression was confirmed with easy passage of ball ended nerve hook in the epidural space.  Remaining facet was decorticated.  Using the neuro navigation, navigated pedicle screws were then placed on the right side through the premade trajectories.  110 mm rods were then placed through the screw towers and secured with screw caps.  A final O-arm CT was obtained and showed the instrumentation was in excellent position.  The screw caps were final tightened, with compression placed on her coronal convex side on the right.  The screw towers were then removed.    The wounds were irrigated thoroughly and Exparel was injected in the paraspinous muscles.  The fascia layer was closed with 0 Vicryl stitches.  The subcutaneous tissue and skin was closed with 2-0 Vicryl stitches.  Skin was closed with 4-0 Monocryl in subcuticular manner followed by Dermabond.  The PSIS pin was then removed and the stab wound closed with 2-0 Vicryl and 4-0 Monocryl.  Patient was then flipped supine with the plan of extubation by the anesthesia service.  All counts were correct at the end of surgery.  No complications were noted.  findings: Successful interbody and posterior fusion. Large amount of epidural fat with good decompression  Estimated Blood Loss:  700 ml         Drains: none         Total IV Fluids: See anesthesia records  Blood Given: none          Specimens: none         Implants:  Alpha-Tec Interbodies L2-3 8x18x45 mm L3-4 6phx9ahx18x45 mm L4-5 8phx11ahx18x50 mm AMP size 6 plate At Z6-1 5.5 x 45 mm screw  Medtronic pedicle screws 6.5 x 45 mm  L L2, L3,  L4, L5 5.5 x 45 mm R L2, L3, L4 7.5 x 40 mm R L5 110 mm rod x 2        Complications:  * No complications entered in OR log *         Disposition: PACU - hemodynamically stable.         Condition:  stable

## 2020-09-11 NOTE — Transfer of Care (Signed)
Immediate Anesthesia Transfer of Care Note  Patient: ALAIRA LEVEL  Procedure(s) Performed: Anterior Lateral Lumbar Interbody Fusion Lumbar Two-Three, Lumbar Three-Four, Lumbar four-five (Spine Lumbar) Minimally invasive fusion Lumbar four-five, Lumbar five-Sacral one (Back)  Patient Location: PACU  Anesthesia Type:General  Level of Consciousness: Patient remains intubated per anesthesia plan  Airway & Oxygen Therapy: Patient remains intubated per anesthesia plan and Patient placed on Ventilator (see vital sign flow sheet for setting)  Post-op Assessment: Report given to RN and Post -op Vital signs reviewed and stable  Post vital signs: Reviewed and stable  Last Vitals:  Vitals Value Taken Time  BP    Temp    Pulse    Resp    SpO2      Last Pain:  Vitals:   09/11/20 0613  TempSrc:   PainSc: 8       Patients Stated Pain Goal: 2 (88/41/66 0630)  Complications: No notable events documented.

## 2020-09-12 ENCOUNTER — Encounter (HOSPITAL_COMMUNITY): Payer: Self-pay | Admitting: Neurosurgery

## 2020-09-12 LAB — URINE CULTURE: Culture: NO GROWTH

## 2020-09-12 MED ORDER — ENOXAPARIN SODIUM 40 MG/0.4ML IJ SOSY
40.0000 mg | PREFILLED_SYRINGE | INTRAMUSCULAR | Status: DC
  Administered 2020-09-12: 40 mg via SUBCUTANEOUS
  Filled 2020-09-12: qty 0.4

## 2020-09-12 NOTE — Evaluation (Signed)
Occupational Therapy Evaluation Patient Details Name: Susan Franco MRN: [id number] DOB: 1960-05-21 Today's Date: 09/12/2020    History of Present Illness Pt is a 60 year old woman admitted on 09/11/20 for ALIF L 2-3, 3-4. 4-5 and minimally invasive fusion L 4-5, L5-S1. PMH: HTN, smoker, RA, psoriatic arthritis.   Clinical Impression   Pt was ambulating with a rollator and independent in self care prior to admission. She works at a funeral home. Her family was assisting with IADL. Pt describes her pain as "soreness." She demonstrated ability to perform bed mobility with supervision and cues for log roll technique. She stood and transferred with RW with min assist. Pt requires set up to max assist for ADL. She has excellent support of her family when she returns home. Will follow acutely.     Follow Up Recommendations  No OT follow up;Supervision/Assistance - 24 hour    Equipment Recommendations  None recommended by OT    Recommendations for Other Services       Precautions / Restrictions Precautions Precautions: Fall;Back Precaution Booklet Issued: Yes (comment) Required Braces or Orthoses: Spinal Brace Spinal Brace: Lumbar corset;Applied in sitting position (can go to the bathroom without brace)      Mobility Bed Mobility Overal bed mobility: Needs Assistance Bed Mobility: Rolling;Sidelying to Sit;Sit to Sidelying Rolling: Supervision Sidelying to sit: Supervision     Sit to sidelying: Supervision General bed mobility comments: cues for technique    Transfers Overall transfer level: Needs assistance Equipment used: Rolling walker (2 wheeled) Transfers: Sit to/from Stand Sit to Stand: Min assist         General transfer comment: cues for hand placement, increased time, min assist to rise    Balance Overall balance assessment: Needs assistance   Sitting balance-Leahy Scale: Fair     Standing balance support: Bilateral upper extremity supported Standing  balance-Leahy Scale: Poor                             ADL either performed or assessed with clinical judgement   ADL Overall ADL's : Needs assistance/impaired Eating/Feeding: Independent   Grooming: Oral care;Set up;Sitting   Upper Body Bathing: Minimal assistance;Sitting   Lower Body Bathing: Maximal assistance;Sit to/from stand   Upper Body Dressing : Set up;Sitting   Lower Body Dressing: Maximal assistance;Sit to/from stand   Toilet Transfer: Minimal assistance;Stand-pivot;RW   Toileting- Clothing Manipulation and Hygiene: Maximal assistance;Sit to/from stand               Vision Patient Visual Report: No change from baseline       Perception     Praxis      Pertinent Vitals/Pain Pain Assessment: Faces Faces Pain Scale: Hurts little more Pain Location: back Pain Descriptors / Indicators: Sore Pain Intervention(s): Monitored during session;Premedicated before session;Repositioned     Hand Dominance Right   Extremity/Trunk Assessment Upper Extremity Assessment Upper Extremity Assessment: Overall WFL for tasks assessed   Lower Extremity Assessment Lower Extremity Assessment: Defer to PT evaluation       Communication Communication Communication: No difficulties   Cognition Arousal/Alertness: Awake/alert Behavior During Therapy: WFL for tasks assessed/performed Overall Cognitive Status: Within Functional Limits for tasks assessed                                     General Comments  Exercises     Shoulder Instructions      Home Living Family/patient expects to be discharged to:: Private residence Living Arrangements: Children Available Help at Discharge: Family;Available 24 hours/day Type of Home: House Home Access: Ramped entrance     Home Layout: Able to live on main level with bedroom/bathroom     Bathroom Shower/Tub: Hospital doctor Toilet: Handicapped height     Home Equipment: Hand  held shower head;Grab bars - tub/shower;Grab bars - toilet;Shower seat - built in;Walker - 4 wheels;Cane - single point;Other (comment);Adaptive equipment (grab bar on bed) Adaptive Equipment: Long-handled sponge        Prior Functioning/Environment Level of Independence: Needs assistance  Gait / Transfers Assistance Needed: walking with a rollator ADL's / Homemaking Assistance Needed: assisted for IADL   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        OT Problem List: Impaired balance (sitting and/or standing);Decreased knowledge of use of DME or AE;Obesity;Pain;Decreased knowledge of precautions      OT Treatment/Interventions: Self-care/ADL training;DME and/or AE instruction;Therapeutic activities;Patient/family education;Balance training    OT Goals(Current goals can be found in the care plan section) Acute Rehab OT Goals Patient Stated Goal: return to PLOF OT Goal Formulation: With patient Time For Goal Achievement: 09/26/20 Potential to Achieve Goals: Good ADL Goals Pt Will Perform Grooming: with modified independence;standing Pt Will Perform Lower Body Bathing: with modified independence;sit to/from stand;with adaptive equipment Pt Will Perform Lower Body Dressing: with modified independence;sit to/from stand;with adaptive equipment Pt Will Transfer to Toilet: with modified independence;ambulating Pt Will Perform Toileting - Clothing Manipulation and hygiene: with modified independence;sit to/from stand;with adaptive equipment Additional ADL Goal #1: Pt will adhere to back precautions during mobility, ADL and IADL.  OT Frequency: Min 2X/week   Barriers to D/C:            Co-evaluation              AM-PAC OT "6 Clicks" Daily Activity     Outcome Measure Help from another person eating meals?: None Help from another person taking care of personal grooming?: A Little Help from another person toileting, which includes using toliet, bedpan, or urinal?: A Lot Help  from another person bathing (including washing, rinsing, drying)?: A Lot Help from another person to put on and taking off regular upper body clothing?: A Little Help from another person to put on and taking off regular lower body clothing?: A Lot 6 Click Score: 16   End of Session Equipment Utilized During Treatment: Rolling walker Nurse Communication: Other (comment) (aware pt is awaiting back brace)  Activity Tolerance: Patient tolerated treatment well Patient left: in bed;with call bell/phone within reach;with family/visitor present  OT Visit Diagnosis: Unsteadiness on feet (R26.81);Other abnormalities of gait and mobility (R26.89);Pain                Time: 0940-1003 OT Time Calculation (min): 23 min Charges:  OT General Charges $OT Visit: 1 Visit OT Evaluation $OT Eval Moderate Complexity: 1 Mod OT Treatments $Self Care/Home Management : 8-22 mins  Nestor Lewandowsky, OTR/L Acute Rehabilitation Services Pager: [phone] Office: [phone]   Malka So 09/12/2020, 10:17 AM

## 2020-09-12 NOTE — Progress Notes (Signed)
Orthopedic Tech Progress Note Patient Details:  Susan Franco [birth year] [id number]  Ortho Devices Type of Ortho Device: Lumbar corsett Ortho Device/Splint Location: Back Ortho Device/Splint Interventions: Ordered   Post Interventions Patient Tolerated: Other (comment) Instructions Provided: Other (comment)  [name] 09/12/2020, 11:27 PM

## 2020-09-12 NOTE — Progress Notes (Signed)
Subjective: Patient reports soreness but feels pain is improved from preop. No anterior thigh numbness and no leg pains that she was having preoperatively.  Objective: Vital signs in last 24 hours: Temp:  [97.1 F (36.2 C)-98.9 F (37.2 C)] 98.3 F (36.8 C) (06/17 0740) Pulse Rate:  [84-98] 88 (06/17 0740) Resp:  [15-17] 15 (06/17 0740) BP: (105-173)/(67-95) 134/88 (06/17 0740) SpO2:  [90 %-99 %] 90 % (06/17 0740) Arterial Line BP: (119-175)/(54-73) 135/60 (06/16 2015) FiO2 (%):  [40 %] 40 % (06/16 1930)  Intake/Output from previous day: 06/16 0701 - 06/17 0700 In: 4617.5 [I.V.:3467.5; IV Piggyback:1150] Out: 2655 [Urine:1955; Blood:700] Intake/Output this shift: No intake/output data recorded.    General : Alert, cooperative, no distress, appears stated age   Head:  Normocephalic/atraumatic    Eyes: PERRL, conjunctiva/corneas clear, EOM's intact. Fundi could not be visualized Neck: Supple Chest:  Respirations unlabored Chest wall: no tenderness or deformity Heart: Regular rate and rhythm Abdomen: Soft, nontender and nondistended Extremities: warm and well-perfused Skin: normal turgor, color and texture Neurologic:  Alert, oriented x 3.  Eyes open spontaneously. PERRL, EOMI, VFC, no facial droop. V1-3 intact.  No dysarthria, tongue protrusion symmetric.  CNII-XII intact. Normal strength, sensation and reflexes throughout.  No pronator drift, full strength in legs  Dressings c/d         Lab Results: Recent Labs    09/09/20 1530 09/11/20 1427 09/11/20 1709  WBC 4.1  --   --   HGB 13.3 12.6 11.2*  HCT 39.7 37.0 33.0*  PLT 205  --   --    BMET Recent Labs    09/09/20 1530 09/11/20 1427 09/11/20 1709  NA 133* 135 136  K 3.5 3.5 3.7  CL 98  --   --   CO2 27  --   --   GLUCOSE 96  --   --   BUN <5*  --   --   CREATININE 0.71  --   --   CALCIUM 9.5  --   --     Studies/Results: DG Lumbar Spine 2-3 Views  Result Date: 09/11/2020 CLINICAL DATA:  L2-L4  DLIF.  L4-S1 TLIF. EXAM: DG C-ARM 1-60 MIN; LUMBAR SPINE - 2-3 VIEW FLUOROSCOPY TIME:  Fluoroscopy Time:  4 MINUTES AND 3 SECONDS. Radiation Exposure Index (if provided by the fluoroscopic device): 219.24 Number of Acquired Spot Images: 10 COMPARISON:  CT lumbar spine Aug 06, 2020. FINDINGS: Ten C-arm fluoroscopic images were obtained intraoperatively and submitted for post operative interpretation. These images demonstrate lateral interbody fusion at L2-L3, L3-L4, and L4-L5 with left lateral screw at L4-L5. No unexpected findings. No unexpected foreign bodies. Please see the performing provider's procedural report for further detail. IMPRESSION: Intraoperative fluoroscopy, as detailed above. Electronically Signed   By: Margaretha Sheffield MD   On: 09/11/2020 18:46   DG Lumbar Spine 2-3 Views  Result Date: 09/11/2020 CLINICAL DATA:  Evaluation for unexpected foreign bodies.  Effusion. EXAM: LUMBAR SPINE - 2-3 VIEW COMPARISON:  CT Aug 06, 2020. FINDINGS: Postsurgical changes of interbody fusion at what appears to be L2-L3, L3-L4 and L4-L5 on the lateral (first image), although the limited field of view limits accurate assessments of the levels. The second frontal fluoroscopic image demonstrates interbody fusion at L3-L4 and L4-L5 with suspected lateral plate and screw fixation on the left at L4-L5. No unexpected foreign bodies. Please see the performing provider's procedural report for further detail. Findings discussed with the OR at 12:39 PM. IMPRESSION: Intraoperative fluoroscopy, as  detailed above. Electronically Signed   By: Margaretha Sheffield MD   On: 09/11/2020 12:47   DG C-Arm 1-60 Min  Result Date: 09/11/2020 CLINICAL DATA:  L2-L4 DLIF.  L4-S1 TLIF. EXAM: DG C-ARM 1-60 MIN; LUMBAR SPINE - 2-3 VIEW FLUOROSCOPY TIME:  Fluoroscopy Time:  4 MINUTES AND 3 SECONDS. Radiation Exposure Index (if provided by the fluoroscopic device): 219.24 Number of Acquired Spot Images: 10 COMPARISON:  CT lumbar spine Aug 06, 2020. FINDINGS: Ten C-arm fluoroscopic images were obtained intraoperatively and submitted for post operative interpretation. These images demonstrate lateral interbody fusion at L2-L3, L3-L4, and L4-L5 with left lateral screw at L4-L5. No unexpected findings. No unexpected foreign bodies. Please see the performing provider's procedural report for further detail. IMPRESSION: Intraoperative fluoroscopy, as detailed above. Electronically Signed   By: Margaretha Sheffield MD   On: 09/11/2020 18:46   DG C-Arm 1-60 Min-No Report  Result Date: 09/11/2020 Fluoroscopy was utilized by the requesting physician.  No radiographic interpretation.    Assessment/Plan: S/p L2-3, L3-4, L4-5 DLIFs, MIS decompressions - mobilize with PT/OT - pain control - Lovenox for DVT ppx starting tomorrow - hold Morrie Sheldon for 2 weeks  Susan Franco 09/12/2020, 8:21 AM

## 2020-09-12 NOTE — Evaluation (Signed)
Physical Therapy Evaluation Patient Details Name: Susan Franco MRN: [id number] DOB: Jun 15, 1960 Today's Date: 09/12/2020   History of Present Illness  Pt is a 60 year old woman admitted on 09/11/20 for ALIF L 2-3, 3-4. 4-5 and minimally invasive fusion L 4-5, L5-S1. PMH: HTN, smoker, RA, psoriatic arthritis.   Clinical Impression  Pt in bed upon arrival of PT, agreeable to evaluation at this time. Prior to admission the pt was mobilizing with increasing use of rollator due to pain, but had otherwise been independent PTA. The pt now presents with limitations in functional mobility, strength, power, stability, and endurance due to above dx, and will continue to benefit from skilled PT to address these deficits. The pt was able to complete short bout of ambulation to the bathroom in her room, but demos poor strength and power in her BLE to maintain knee extension in stance, needed max cues and minA to steady and maintain upright. The pt will require continued skilled PT acutely and following d/c to improve functional strength and endurance, but has arranged for multiple children to be at her house to assist at d/c, and therefore will be safe to return home with family when medically cleared.      Follow Up Recommendations Home health PT;Supervision for mobility/OOB    Equipment Recommendations  None recommended by PT    Recommendations for Other Services       Precautions / Restrictions Precautions Precautions: Fall;Back Precaution Booklet Issued: Yes (comment) Precaution Comments: discussed verbally, pt able to recall without cues Required Braces or Orthoses: Spinal Brace Spinal Brace: Lumbar corset;Applied in sitting position (can go to the bathroom without brace) Restrictions Weight Bearing Restrictions: No      Mobility  Bed Mobility Overal bed mobility: Needs Assistance Bed Mobility: Rolling;Sidelying to Sit;Sit to Sidelying Rolling: Supervision Sidelying to sit: Supervision      Sit to sidelying: Supervision General bed mobility comments: cues for technique    Transfers Overall transfer level: Needs assistance Equipment used: Rolling walker (2 wheeled) Transfers: Sit to/from Stand Sit to Stand: Min assist         General transfer comment: cues for hand placement, increased time, min assist to rise  Ambulation/Gait Ambulation/Gait assistance: Min assist Gait Distance (Feet): 15 Feet (+ 84ft) Assistive device: Rolling walker (2 wheeled) Gait Pattern/deviations: Step-through pattern;Decreased stride length;Decreased dorsiflexion - right;Decreased dorsiflexion - left (maintained knees in flexion) Gait velocity: decreased Gait velocity interpretation: <1.31 ft/sec, indicative of household ambulator General Gait Details: pt with slow gait needing minA to steady and max cues for straightening BLE (no episodes of buckling) but pt with gradual increase in knee flexion with fatigue.      Balance Overall balance assessment: Needs assistance Sitting-balance support: No upper extremity supported;Feet supported Sitting balance-Leahy Scale: Fair     Standing balance support: Bilateral upper extremity supported Standing balance-Leahy Scale: Poor Standing balance comment: reliant on BUE support, max cues to maintain bilat knees in extension                             Pertinent Vitals/Pain Pain Assessment: Faces Faces Pain Scale: Hurts even more Pain Location: back Pain Descriptors / Indicators: Sore Pain Intervention(s): Limited activity within patient's tolerance;Monitored during session;Repositioned;RN gave pain meds during session    Home Living Family/patient expects to be discharged to:: Private residence Living Arrangements: Children Available Help at Discharge: Family;Available 24 hours/day Type of Home: House Home Access: Ramped entrance  Home Layout: Able to live on main level with bedroom/bathroom Home Equipment: Hand held  shower head;Grab bars - tub/shower;Grab bars - toilet;Shower seat - built in;Walker - 4 wheels;Cane - single point;Other (comment);Adaptive equipment (grab bar on bed)      Prior Function Level of Independence: Needs assistance   Gait / Transfers Assistance Needed: walking with a rollator  ADL's / Homemaking Assistance Needed: assisted for IADL  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     Hand Dominance   Dominant Hand: Right    Extremity/Trunk Assessment   Upper Extremity Assessment Upper Extremity Assessment: Overall WFL for tasks assessed    Lower Extremity Assessment Lower Extremity Assessment: Generalized weakness;RLE deficits/detail;LLE deficits/detail RLE Deficits / Details: pt denies difference in sensation bilaterally, no numbness or tingling, no change in strength following surgery but increasing weakness in recent weeks RLE Sensation: WNL LLE Deficits / Details: pt denies difference in sensation bilaterally, no numbness or tingling, no change in strength following surgery but increasing weakness in recent weeks LLE Sensation: WNL    Cervical / Trunk Assessment Cervical / Trunk Assessment: Other exceptions Cervical / Trunk Exceptions: s/p spine surgery  Communication   Communication: No difficulties  Cognition Arousal/Alertness: Awake/alert Behavior During Therapy: WFL for tasks assessed/performed Overall Cognitive Status: Within Functional Limits for tasks assessed                                        General Comments General comments (skin integrity, edema, etc.): VSS despite pt reporting dizziness after walking to bathroom    Exercises     Assessment/Plan    PT Assessment Patient needs continued PT services  PT Problem List Decreased strength;Decreased range of motion;Decreased activity tolerance;Decreased balance;Decreased mobility       PT Treatment Interventions DME instruction;Gait training;Stair training;Therapeutic  exercise;Functional mobility training;Therapeutic activities;Balance training;Patient/family education    PT Goals (Current goals can be found in the Care Plan section)  Acute Rehab PT Goals Patient Stated Goal: return to PLOF PT Goal Formulation: With patient Time For Goal Achievement: 09/27/20 Potential to Achieve Goals: Good    Frequency Min 5X/week    AM-PAC PT "6 Clicks" Mobility  Outcome Measure Help needed turning from your back to your side while in a flat bed without using bedrails?: None Help needed moving from lying on your back to sitting on the side of a flat bed without using bedrails?: A Little Help needed moving to and from a bed to a chair (including a wheelchair)?: A Little Help needed standing up from a chair using your arms (e.g., wheelchair or bedside chair)?: A Little Help needed to walk in hospital room?: A Little Help needed climbing 3-5 steps with a railing? : A Lot 6 Click Score: 18    End of Session Equipment Utilized During Treatment: Gait belt Activity Tolerance: Patient tolerated treatment well;Patient limited by fatigue Patient left: in bed;with call bell/phone within reach;with bed alarm set Nurse Communication: Mobility status PT Visit Diagnosis: Other abnormalities of gait and mobility (R26.89);Muscle weakness (generalized) (M62.81)    Time: 5093-2671 PT Time Calculation (min) (ACUTE ONLY): 27 min   Charges:   PT Evaluation $PT Eval Low Complexity: 1 Low PT Treatments $Gait Training: 8-22 mins        Karma Ganja, PT, DPT   Acute Rehabilitation Department Pager #: [phone]  Otho Bellows 09/12/2020, 4:22 PM

## 2020-09-13 MED ORDER — OXYCODONE HCL 5 MG PO TABS: 5.0000 mg | ORAL_TABLET | ORAL | 0 refills | Status: DC | PRN

## 2020-09-13 MED ORDER — XELJANZ XR 11 MG PO TB24: 11.0000 mg | ORAL_TABLET | Freq: Every day | ORAL | Status: DC

## 2020-09-13 NOTE — Progress Notes (Signed)
Neurosurgery Service Progress Note  Subjective: No acute events overnight, still significantly better than preop   Objective: Vitals:   09/12/20 1929 09/12/20 2329 09/13/20 0304 09/13/20 0700  BP: (!) 148/77 (!) 159/90 (!) 156/142 132/82  Pulse: 93 (!) 103 (!) 104 89  Resp: 16 16 16 16   Temp: 100.3 F (37.9 C) 99.7 F (37.6 C) 99.6 F (37.6 C) 97.7 F (36.5 C)  TempSrc: Oral   Oral  SpO2: 91% (!) 89% 90% 92%  Weight:      Height:        Physical Exam: Strength 5/5 x4, SILTx4 Incisions c/d/i  Assessment & Plan: 60 y.o. woman s/p multilevel decompression and instrumented fusion, recovering well.  -okay to discharge home today w/ home PT  Judith Part  09/13/20 9:25 AM

## 2020-09-13 NOTE — Discharge Summary (Signed)
Discharge Summary  Date of Admission: 09/11/2020  Date of Discharge: 09/13/20  Attending Physician: Duffy Rhody, MD  Hospital Course: Patient was admitted following an uncomplicated multilevel minimally invasive decompression and instrumented fusion. She was recovered in PACU and transferred to 4NP. Her hospital course was uncomplicated and the patient was discharged home w/ home health PT on 09/13/20. She will follow up in clinic with Dr. Marcello Moores in 2 weeks.  Neurologic exam at discharge:  Strength 5/5 x4, SILTx4  Discharge diagnosis: Lumbar radiculopathy  Judith Part, MD 09/13/20 9:30 AM

## 2020-09-13 NOTE — Progress Notes (Signed)
Occupational Therapy Treatment Patient Details Name: Susan Franco MRN: [id number] DOB: 07-08-1960 Today's Date: 09/13/2020    History of present illness Pt is a 60 year old woman admitted on 09/11/20 for ALIF L 2-3, 3-4. 4-5 and minimally invasive fusion L 4-5, L5-S1. PMH: HTN, smoker, RA, psoriatic arthritis.   OT comments  Pt. See for skilled OT treatment.  Dtr. Present and will be main caregiver upon d/c home.  Reviewed and provided demo of log roll, pt. Able to complete without assistance, just cues for sequencing.  Don brace mod a with caregiver instruction also.  Toileting task min guard a with cues to maintain back precautions.  Grooming and LB dressing strategies for maintaining back precautions and promoting energy conservation.  D/c home likely later today.  Dtr. Reports she will be staying with her mom to help.    Follow Up Recommendations  No OT follow up;Supervision/Assistance - 24 hour    Equipment Recommendations  None recommended by OT    Recommendations for Other Services      Precautions / Restrictions Precautions Precautions: Fall;Back Precaution Comments: discussed verbally, pt able to recall without cues Required Braces or Orthoses: Spinal Brace Spinal Brace: Lumbar corset;Applied in sitting position       Mobility Bed Mobility Overal bed mobility: Needs Assistance Bed Mobility: Rolling;Sidelying to Sit Rolling: Supervision Sidelying to sit: Supervision       General bed mobility comments: cues for technique    Transfers Overall transfer level: Needs assistance Equipment used: Rolling walker (2 wheeled) Transfers: Sit to/from [facility] Sit to Stand: Min guard Stand pivot transfers: Min guard       General transfer comment: good hand placment and would say " i need to feel first before i sit down"    Balance                                           ADL either performed or assessed with clinical judgement    ADL Overall ADL's : Needs assistance/impaired       Grooming Details (indicate cue type and reason): reviewed 2 cup method to maintain back precautions while rinsing and spitting       Lower Body Bathing Details (indicate cue type and reason): dtr reports she will assist her mother and they also have hand held shower Upper Body Dressing : Moderate assistance;Sitting;Cueing for sequencing Upper Body Dressing Details (indicate cue type and reason): education provided to pt. and dtr. for how to don and doff the brace, demonstrated x2 Lower Body Dressing: Maximal assistance;Sit to/from stand;Sitting/lateral leans Lower Body Dressing Details (indicate cue type and reason): attempted figure 4 and also turning in bed to bring leg sideways at this time pt. unable, dtr. reports she will assist, reviewed energy conservation with "pre loading" lower garments to reduce amount of sit/stand Toilet Transfer: Min guard;Cueing for sequencing;Cueing for safety;Ambulation;Comfort height toilet;BSC;Grab bars;RW Armed forces technical officer Details (indicate cue type and reason): 3n1 over commode, good recall of hand placement but required cues for no twisting when wanting to throw t.p. in toilet Toileting- Clothing Manipulation and Hygiene: Min guard;Sit to/from stand     Tub/Shower Transfer Details (indicate cue type and reason): dtr. reports walk in shower with hand held Functional mobility during ADLs: Min guard General ADL Comments: dtr. will be staying with pt. for "a few months" in town from [place] to help  her mother. education to pt. and dtr. for log roll, back precautions, donning brace     Vision       Perception     Praxis      Cognition Arousal/Alertness: Awake/alert Behavior During Therapy: WFL for tasks assessed/performed Overall Cognitive Status: Within Functional Limits for tasks assessed                                          Exercises     Shoulder Instructions        General Comments      Pertinent Vitals/ Pain       Pain Assessment: No/denies pain  Home Living                                          Prior Functioning/Environment              Frequency  Min 2X/week        Progress Toward Goals  OT Goals(current goals can now be found in the care plan section)  Progress towards OT goals: Progressing toward goals     Plan      Co-evaluation                 AM-PAC OT "6 Clicks" Daily Activity     Outcome Measure   Help from another person eating meals?: None Help from another person taking care of personal grooming?: A Little Help from another person toileting, which includes using toliet, bedpan, or urinal?: A Lot Help from another person bathing (including washing, rinsing, drying)?: A Lot Help from another person to put on and taking off regular upper body clothing?: A Little Help from another person to put on and taking off regular lower body clothing?: A Lot 6 Click Score: 16    End of Session Equipment Utilized During Treatment: Gait belt;Rolling walker;Back brace  OT Visit Diagnosis: Unsteadiness on feet (R26.81);Other abnormalities of gait and mobility (R26.89);Pain   Activity Tolerance Patient tolerated treatment well   Patient Left in bed;with family/visitor present;with nursing/sitter in room (seated eob with rn beginning d/c instructions)   Nurse Communication Other (comment) (dtr. has questions about managment of dressing during bathing)        Time: 1002-1019 OT Time Calculation (min): 17 min  Charges: OT General Charges $OT Visit: 1 Visit OT Treatments $Self Care/Home Management : 8-22 mins  Sonia Baller, COTA/L Acute Rehabilitation [phone]    Tanya Nones 09/13/2020, 11:57 AM

## 2020-09-14 NOTE — Anesthesia Postprocedure Evaluation (Signed)
Anesthesia Post Note  Patient: Susan Franco  Procedure(s) Performed: Anterior Lateral Lumbar Interbody Fusion Lumbar Two-Three, Lumbar Three-Four, Lumbar four-five (Spine Lumbar) Minimally invasive fusion Lumbar four-five, Lumbar five-Sacral one (Back)     Patient location during evaluation: PACU Anesthesia Type: General Level of consciousness: awake and alert Pain management: pain level controlled Vital Signs Assessment: post-procedure vital signs reviewed and stable Respiratory status: spontaneous breathing, nonlabored ventilation, respiratory function stable and patient connected to nasal cannula oxygen Cardiovascular status: blood pressure returned to baseline and stable Postop Assessment: no apparent nausea or vomiting Anesthetic complications: no   No notable events documented.  Last Vitals:  Vitals:   09/13/20 0304 09/13/20 0700  BP: (!) 156/142 132/82  Pulse: (!) 104 89  Resp: 16 16  Temp: 37.6 C 36.5 C  SpO2: 90% 92%    Last Pain:  Vitals:   09/13/20 0800  TempSrc:   PainSc: 6                  [name]

## 2020-09-24 NOTE — Addendum Note (Signed)
Addendum  created 09/24/20 1241 by Annye Asa, MD   Attestation recorded in [place], [place] filed, Dance movement psychotherapist edited

## 2021-01-20 NOTE — Progress Notes (Signed)
60 y.o. [id number] Widowed Caucasian female here for annual exam.    Had major back surgery this summer.  Able to walk again without assistance.  Went back to work.   Quit smoking this summer with her surgery.  Effexor is treating hot flashes and stress.  She wants to continue this.   Takes calcium 600 mg, 3 per day.  Vit D one per day.   PCP:  Cecille Amsterdam, MD   Patient's last menstrual period was 11/15/2011.           Sexually active: No.  The current method of family planning is tubal ligation/Hyst.    Exercising: No.   Smoker:  Former  Health Maintenance: Pap: 2012 normal History of abnormal Pap:  no MMG:  04-27-17 normal Colonoscopy: 10/2017 polyps;next 2024 BMD: 01/2017  Result :normal w/rheumatologist.  Will do again at [facility] in January.  TDaP: 01/2018 Gardasil:   no HIV: no Hep C: no Screening Labs:  PCP Flu vaccine:  will do with her PCP in beginning of November.  Covid vaccine:  booster completed.   reports that she has quit smoking. Her smoking use included cigarettes. She smoked an average of 1 pack per day. She has never used smokeless tobacco. She reports current alcohol use of about 2.0 standard drinks per week. She reports that she does not use drugs.  Past Medical History:  Diagnosis Date   History of EKG 08/2011   was reflux-done at [facility] hospital-will request   Hypertension    atenolol and norvasc   Psoriatic arthritis ([place]) 2020   [facility]   Rheumatoid arthritis ([place]) 2017   Seasonal allergies    Smoker     Past Surgical History:  Procedure Laterality Date   ABDOMINAL HYSTERECTOMY     Laparoscopic total hysterectomy for fibroids and bilateral salpingenctomy   CYSTOSCOPY  11/30/2011   Procedure: CYSTOSCOPY;  Surgeon: Peri Maris, MD;  Location: [facility];  Service: Gynecology;  Laterality: N/A;   DIAGNOSTIC LAPAROSCOPY  03/30/1983   ectopic   DILATION AND CURETTAGE OF UTERUS     TRANSFORAMINAL LUMBAR INTERBODY FUSION W/ MIS 2 LEVEL N/A  09/11/2020   Procedure: Minimally invasive fusion Lumbar four-five, Lumbar five-Sacral one;  Surgeon: Vallarie Mare, MD;  Location: [place];  Service: Neurosurgery;  Laterality: N/A;    Current Outpatient Medications  Medication Sig Dispense Refill   ALPRAZolam (XANAX) 0.25 MG tablet Take 0.25 mg by mouth 2 (two) times daily as needed for anxiety.     amLODipine (NORVASC) 5 MG tablet Take 5 mg by mouth daily.     atenolol (TENORMIN) 50 MG tablet Take 50 mg by mouth daily.     lisinopril (PRINIVIL,ZESTRIL) 20 MG tablet Take 20 mg by mouth daily.     omeprazole (PRILOSEC) 40 MG capsule Take 40 mg by mouth daily.     spironolactone (ALDACTONE) 25 MG tablet Take 25 mg by mouth daily.     Tofacitinib Citrate ER (XELJANZ XR) 11 MG TB24 Take 11 mg by mouth daily. Okay to restart 09/26/20 30 tablet    venlafaxine XR (EFFEXOR XR) 75 MG 24 hr capsule Take 1 capsule (75 mg total) by mouth daily with breakfast. 90 capsule 3   oxyCODONE (OXY IR/ROXICODONE) 5 MG immediate release tablet Take 1 tablet (5 mg total) by mouth every 3 (three) hours as needed for moderate pain ((score 4 to 6)). (Patient not taking: Reported on 01/21/2021) 30 tablet 0   No current facility-administered medications for  this visit.    Family History  Problem Relation Age of Onset   Osteoporosis Mother    Cancer Mother        renal cell, brain   CAD Father    CAD Paternal Uncle    Parkinson's disease Maternal Grandfather    Cervical cancer Paternal Grandmother    CAD Paternal Grandfather     Review of Systems  All other systems reviewed and are negative.  Exam:   BP 120/76 (Cuff Size: Normal)   Pulse 82   Ht 5\' 11"  (1.803 m)   Wt 246 lb (111.6 kg)   LMP 11/15/2011   SpO2 98%   BMI 34.31 kg/m     General appearance: alert, cooperative and appears stated age Head: normocephalic, without obvious abnormality, atraumatic Neck: no adenopathy, supple, symmetrical, trachea midline and thyroid normal to inspection and  palpation Lungs: clear to auscultation bilaterally Breasts: normal appearance, no masses or tenderness, No nipple retraction or dimpling, No nipple discharge or bleeding, No axillary adenopathy Heart: regular rate and rhythm Abdomen: soft, non-tender; no masses, no organomegaly Extremities: extremities normal, atraumatic, no cyanosis or edema Skin: skin color, texture, turgor normal. No rashes or lesions Lymph nodes: cervical, supraclavicular, and axillary nodes normal. Neurologic: grossly normal  Pelvic: External genitalia:  no lesions              No abnormal inguinal nodes palpated.              Urethra:  normal appearing urethra with no masses, tenderness or lesions              Bartholins and Skenes: normal                 Vagina: normal appearing vagina with normal color and discharge, no lesions              Cervix: absent              Pap taken: no Bimanual Exam:  Uterus:  absent              Adnexa: no mass, fullness, tenderness              Rectal exam: yes.  Confirms.              Anus:  normal sphincter tone, no lesions  Chaperone was present for exam:  Estill Bamberg, CMA  Assessment:   Well woman visit with gynecologic exam. Status post robotic TLH and bilateral salpingectomy.  Ovaries remain. Menopausal symptoms.  Quit tobacco use.  Status post back surgery. Rheumatoid arthritis.    Plan: Mammogram screening discussed.  She will schedule at [facility].  Self breast awareness reviewed. Pap and HR HPV as above. Guidelines for Calcium, Vitamin D, regular exercise program including cardiovascular and weight bearing exercise. Refill of Effexor XR.  Follow up annually and prn.   After visit summary provided.

## 2021-01-21 ENCOUNTER — Encounter: Payer: Self-pay | Admitting: Obstetrics and Gynecology

## 2021-01-21 ENCOUNTER — Ambulatory Visit (INDEPENDENT_AMBULATORY_CARE_PROVIDER_SITE_OTHER): Payer: 59 | Admitting: Obstetrics and Gynecology

## 2021-01-21 ENCOUNTER — Other Ambulatory Visit: Payer: Self-pay

## 2021-01-21 VITALS — BP 120/76 | HR 82 | Ht 71.0 in | Wt 246.0 lb

## 2021-01-21 DIAGNOSIS — Z01419 Encounter for gynecological examination (general) (routine) without abnormal findings: Secondary | ICD-10-CM | POA: Diagnosis not present

## 2021-01-21 MED ORDER — VENLAFAXINE HCL ER 75 MG PO CP24: 75.0000 mg | ORAL_CAPSULE | Freq: Every day | ORAL | 3 refills | Status: DC

## 2021-01-21 NOTE — Patient Instructions (Signed)

## 2021-08-18 ENCOUNTER — Other Ambulatory Visit: Payer: Self-pay | Admitting: Neurosurgery

## 2021-08-18 DIAGNOSIS — M5136 Other intervertebral disc degeneration, lumbar region: Secondary | ICD-10-CM

## 2021-08-21 ENCOUNTER — Ambulatory Visit
Admission: RE | Admit: 2021-08-21 | Discharge: 2021-08-21 | Disposition: A | Discharge status: Home / Self Care | Source: Ambulatory Visit | Attending: Neurosurgery | Admitting: Neurosurgery

## 2021-08-21 DIAGNOSIS — M5136 Other intervertebral disc degeneration, lumbar region: Secondary | ICD-10-CM

## 2021-08-21 IMAGING — CT CT L SPINE W/O CM
1 series · 12 of 14 positions shown, 15 images · non-contrast
Comparison: Radiographs dated [DATE] and prior

CLINICAL DATA: Low back pain with unsteady gait



[Series 3: l spine soft (person_name) · axial · 0.40mm/px · z∈[-439,-227]mm · 12 of 126 slices shown, 15 images]
[im 10/126  soft-tissue]
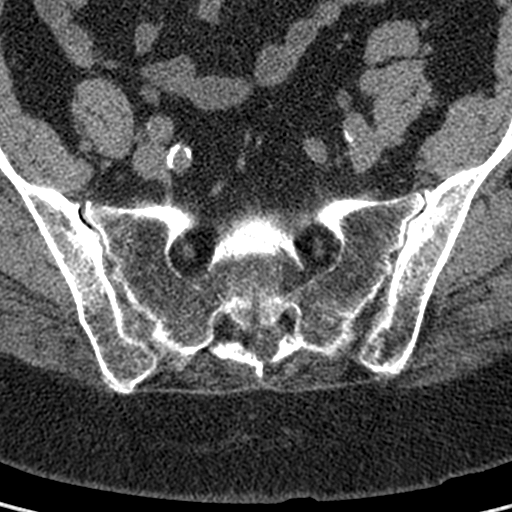
[im 10/126  bone]
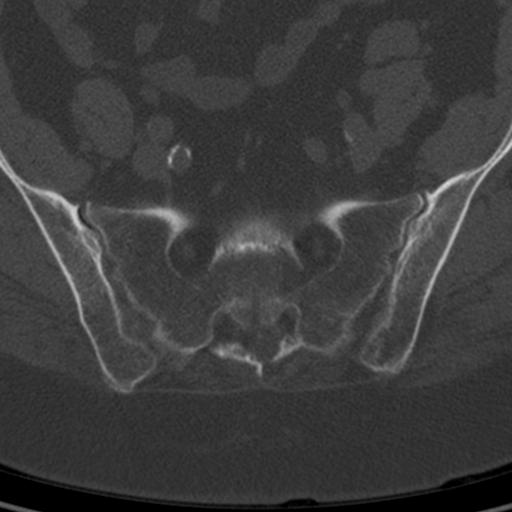
[im 20/126  bone]
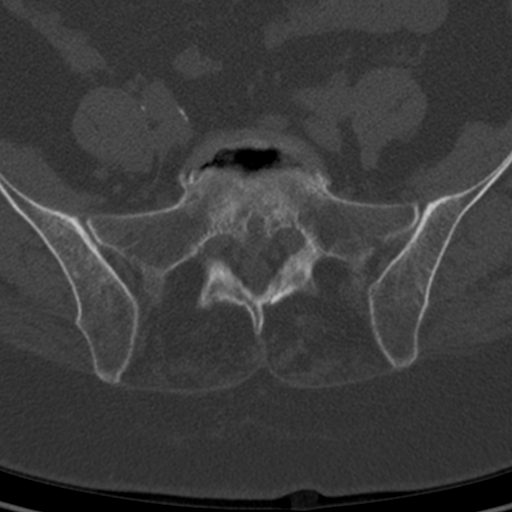
[im 29/126  bone]
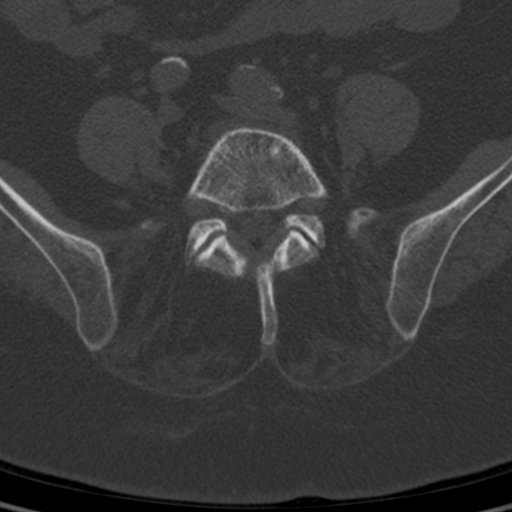
[im 39/126  bone]
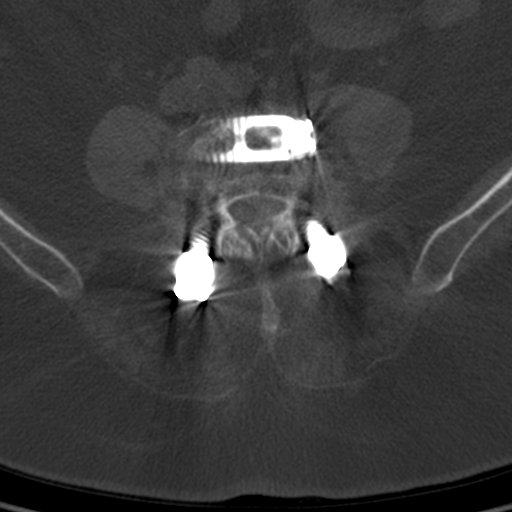
[im 49/126  soft-tissue]
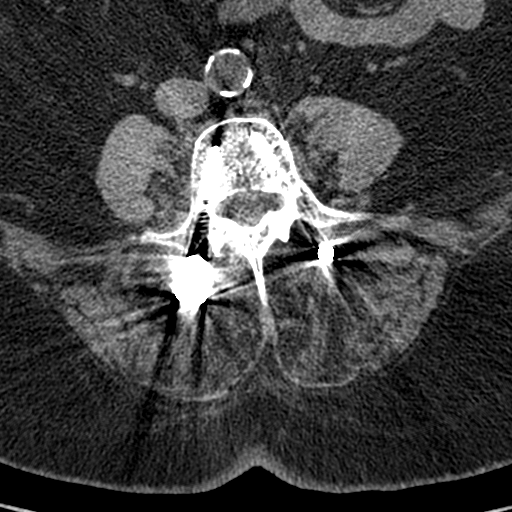
[im 49/126  bone]
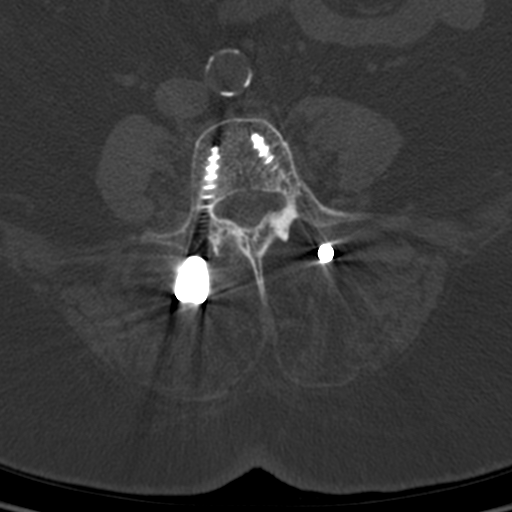
[im 58/126  bone]
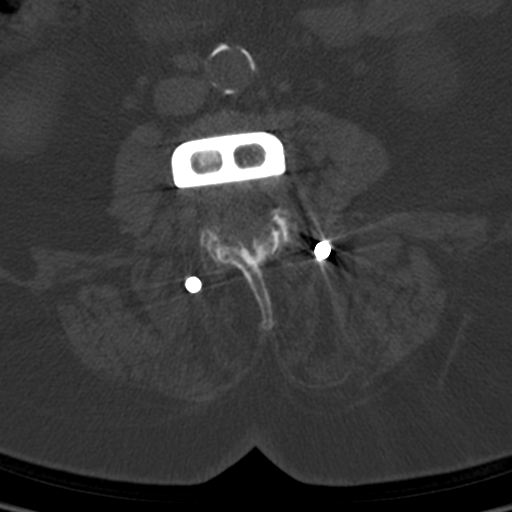
[im 68/126  bone]
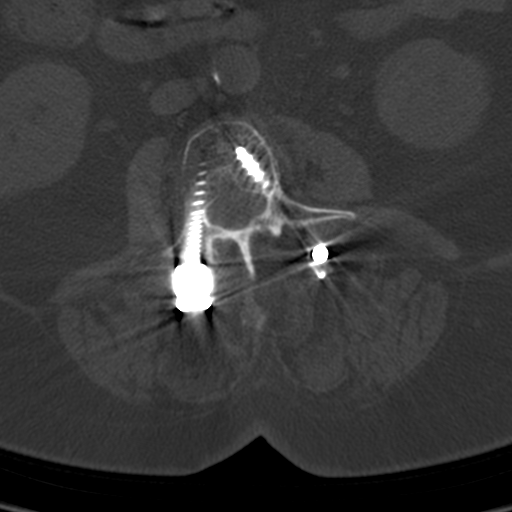
[im 77/126  bone]
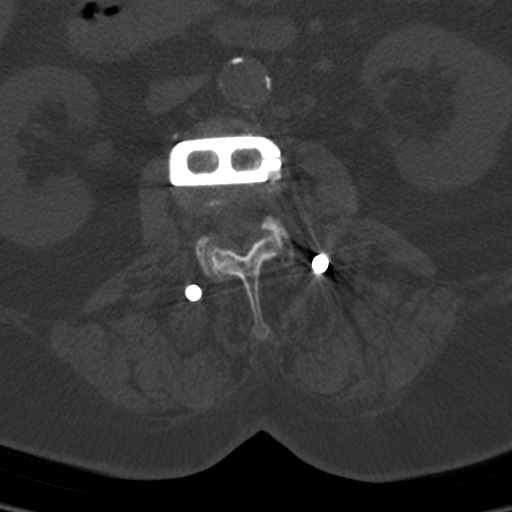
[im 87/126  soft-tissue]
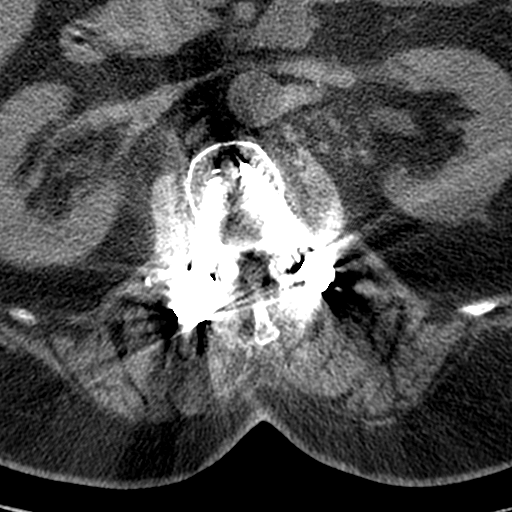
[im 87/126  bone]
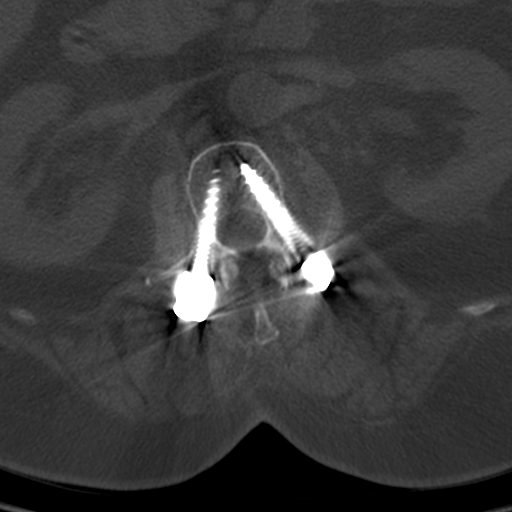
[im 97/126  bone]
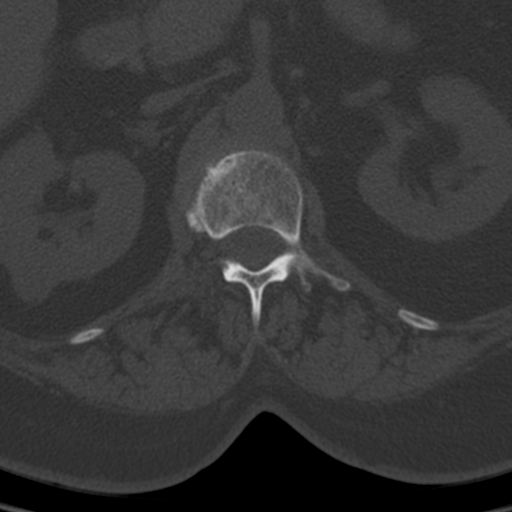
[im 106/126  bone]
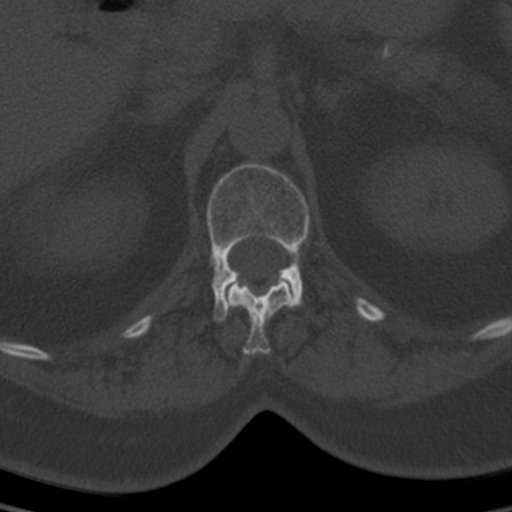
[im 116/126  bone]
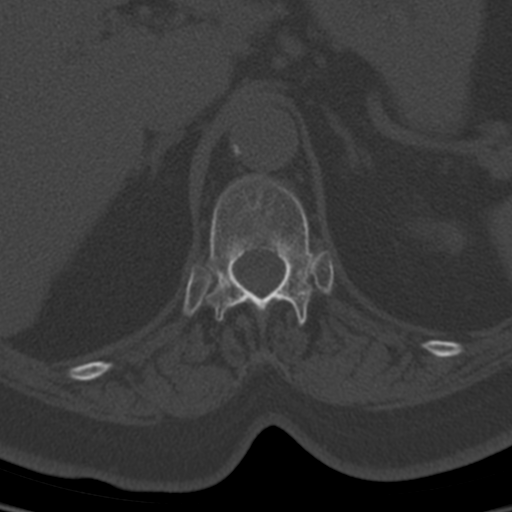

[12 of 14 positions shown; findings below may reference images not displayed]

FINDINGS: Segmentation: 5 lumbar type vertebrae.

Alignment: Normal.

Vertebrae: Posterior spinal fusion at L2-L5. Interbody fusion at
L2-L3, L3-L4 and L4-L5. There is also lateral interbody fusion at
L4-L5. The hardware is intact. No perihardware loosening.

Paraspinal and other soft tissues: Atherosclerotic calcification of
aorta. No acute abnormality. Exophytic cyst in the interpolar region
of the right kidney measuring simple fluid density.

Disc levels:

T12-L1: No significant disc bulge, spinal canal or neural foraminal
stenosis.

L1-L2: Asymmetric disc protrusion to the right with narrowing of the
right lateral recess. Mild right neural foraminal stenosis.
Mild-to-moderate facet joint arthropathy.

L2-L3: Interbody fusion. No significant spinal canal or neural
foraminal stenosis.

L3-L4: Interbody fusion. Right laminectomy. No spinal canal or
neural foraminal stenosis.

L4-L5: Left lateral interbody fusion. Disc space height loss. No
significant spinal canal neural foraminal stenosis. Moderate facet
joint arthropathy.

L5-S1: Broad-based disc bulge. Mild-to-moderate bilateral neural
foraminal stenosis. Moderate facet joint arthropathy.
IMPRESSION: 1.  No evidence of acute fracture or subluxation.

2. Postsurgical changes with posterior spinal fusion at L2-L5,
interbody fusion at L2-L3, L3-L4 and left lateral body fusion at
L5-S1. Hardware is intact. No evidence of perihardware loosening.

3. Broad-based disc bulge and mild-to-moderate bilateral neural
foraminal stenosis at L5-S1. Moderate facet joint arthropathy.

## 2021-09-04 ENCOUNTER — Other Ambulatory Visit: Payer: Self-pay | Admitting: Neurosurgery

## 2021-09-04 DIAGNOSIS — M5126 Other intervertebral disc displacement, lumbar region: Secondary | ICD-10-CM

## 2021-09-22 ENCOUNTER — Other Ambulatory Visit

## 2021-10-02 ENCOUNTER — Ambulatory Visit
Admission: RE | Admit: 2021-10-02 | Discharge: 2021-10-02 | Disposition: A | Discharge status: Home / Self Care | Source: Ambulatory Visit | Attending: Neurosurgery | Admitting: Neurosurgery

## 2021-10-02 DIAGNOSIS — M5126 Other intervertebral disc displacement, lumbar region: Secondary | ICD-10-CM

## 2022-02-17 NOTE — Progress Notes (Unsigned)
61 y.o. [id number] Widowed Caucasian female here for annual exam, pt complains of dysuria, vaginal odor both starting one week ago.   Voiding small amounts and urgency.  No hematuria.   Having increased heat, which is significant, day and night.  Taking Effexor 150 mg daily, increased from 75 through her PCP.  I was prescribing the medication originally.  The increased dosage is not helping her hot flashes.   She feels short of breath with walking.  She is concerned about potential COPD and her heart health.  Taking vit D and calcium.   She lost her job.  Walking with a cane.  PCP:   Cecille Amsterdam, MD  Patient's last menstrual period was 11/15/2011.           Sexually active: No.  The current method of family planning is tubal ligation/ hyestrectomy  Exercising: Yes.     Smoker:  no, former  Health Maintenance: Pap:  2012 normal History of abnormal Pap:  no MMG:  04/27/17.  She will schedule at [facility].  Colonoscopy:  10/2017 polyps, next 2024 BMD:   01/2017  Result  normal w/rheumatologist.  Will do again at [facility] in January (check with pt) TDaP:  01/2018 Gardasil:   no HIV: no Hep C: no Screening Labs:  PCP Flu vaccine through PCP in 2 weeks.  Covid vaccine discussed.    reports that she has quit smoking. Her smoking use included cigarettes. She smoked an average of 1 pack per day. She has never used smokeless tobacco. She reports current alcohol use of about 2.0 standard drinks of alcohol per week. She reports that she does not use drugs.  Past Medical History:  Diagnosis Date   History of EKG 08/2011   was reflux-done at [facility] hospital-will request   Hypertension    atenolol and norvasc   Psoriatic arthritis ([place]) 2020   [facility]   Rheumatoid arthritis ([place]) 2017   Seasonal allergies    Smoker     Past Surgical History:  Procedure Laterality Date   ABDOMINAL HYSTERECTOMY     Laparoscopic total hysterectomy for fibroids and bilateral salpingenctomy    CYSTOSCOPY  11/30/2011   Procedure: CYSTOSCOPY;  Surgeon: Peri Maris, MD;  Location: [facility];  Service: Gynecology;  Laterality: N/A;   DIAGNOSTIC LAPAROSCOPY  03/30/1983   ectopic   DILATION AND CURETTAGE OF UTERUS     TRANSFORAMINAL LUMBAR INTERBODY FUSION W/ MIS 2 LEVEL N/A 09/11/2020   Procedure: Minimally invasive fusion Lumbar four-five, Lumbar five-Sacral one;  Surgeon: Vallarie Mare, MD;  Location: [place];  Service: Neurosurgery;  Laterality: N/A;    Current Outpatient Medications  Medication Sig Dispense Refill   ALPRAZolam (XANAX) 0.25 MG tablet Take 0.25 mg by mouth 2 (two) times daily as needed for anxiety.     amLODipine (NORVASC) 5 MG tablet Take 5 mg by mouth daily.     atenolol (TENORMIN) 50 MG tablet Take 50 mg by mouth daily.     lisinopril (PRINIVIL,ZESTRIL) 20 MG tablet Take 20 mg by mouth daily.     omeprazole (PRILOSEC) 40 MG capsule Take 40 mg by mouth daily.     oxyCODONE (OXY IR/ROXICODONE) 5 MG immediate release tablet Take 1 tablet (5 mg total) by mouth every 3 (three) hours as needed for moderate pain ((score 4 to 6)). 30 tablet 0   spironolactone (ALDACTONE) 25 MG tablet Take 25 mg by mouth daily.     Tofacitinib Citrate ER (XELJANZ XR) 11 MG  TB24 Take 11 mg by mouth daily. Okay to restart 09/26/20 30 tablet    venlafaxine XR (EFFEXOR XR) 75 MG 24 hr capsule Take 1 capsule (75 mg total) by mouth daily with breakfast. 90 capsule 3   No current facility-administered medications for this visit.    Family History  Problem Relation Age of Onset   Osteoporosis Mother    Cancer Mother        renal cell, brain   CAD Father    CAD Paternal Uncle    Parkinson's disease Maternal Grandfather    Cervical cancer Paternal Grandmother    CAD Paternal Grandfather     Review of Systems  See HPI.   Exam:   BP 134/82 (BP Location: Right Arm, Patient Position: Sitting, Cuff Size: Large)   Ht 5' 10.5" (1.791 m)   Wt 263 lb (119.3 kg)   LMP 11/15/2011   BMI  37.20 kg/m     General appearance: alert, cooperative and appears stated age Head: normocephalic, without obvious abnormality, atraumatic Neck: no adenopathy, supple, symmetrical, trachea midline and thyroid normal to inspection and palpation Lungs: clear to auscultation bilaterally Breasts: normal appearance, no masses or tenderness, No nipple retraction or dimpling, No nipple discharge or bleeding, No axillary adenopathy Heart: regular rate and rhythm Abdomen: soft, non-tender; no masses, no organomegaly Extremities: extremities normal, atraumatic, no cyanosis or edema Skin: skin color, texture, turgor normal. No rashes or lesions Lymph nodes: cervical, supraclavicular, and axillary nodes normal. Neurologic: grossly normal  Pelvic: External genitalia:  no lesions              No abnormal inguinal nodes palpated.              Urethra:  normal appearing urethra with no masses, tenderness or lesions              Bartholins and Skenes: normal                 Vagina: normal appearing vagina with normal color and discharge, no lesions              Cervix: absent              Pap taken: no Bimanual Exam:  Uterus:  absent.  Exam limited by BMI.              Adnexa: no mass, fullness, tenderness              Rectal exam: yes.  Confirms.              Anus:  normal sphincter tone, no lesions  Chaperone was present for exam:  Raquel Sarna.   Assessment:   Well woman visit with gynecologic exam. Status post robotic TLH and bilateral salpingectomy.  Ovaries remain. Menopausal symptoms.  Quit tobacco use.  Status post back surgery. Rheumatoid arthritis.  Dysuria.  Vaginal odor.   Plan: Mammogram screening discussed. Self breast awareness reviewed. Pap and HR HPV as above. Guidelines for Calcium, Vitamin D, regular exercise program including cardiovascular and weight bearing exercise. Urinalysis and reflex culture.  Wet prep.  Follow up annually and prn.     ***  total time was spent for  this patient encounter, including preparation, face-to-face counseling with the patient, coordination of care, and documentation of the encounter.   After visit summary provided.

## 2022-02-23 ENCOUNTER — Encounter: Payer: Self-pay | Admitting: Obstetrics and Gynecology

## 2022-02-23 ENCOUNTER — Ambulatory Visit (INDEPENDENT_AMBULATORY_CARE_PROVIDER_SITE_OTHER): Admitting: Obstetrics and Gynecology

## 2022-02-23 VITALS — BP 134/82 | HR 80 | Ht 70.5 in | Wt 263.0 lb

## 2022-02-23 DIAGNOSIS — R300 Dysuria: Secondary | ICD-10-CM | POA: Diagnosis not present

## 2022-02-23 DIAGNOSIS — N898 Other specified noninflammatory disorders of vagina: Secondary | ICD-10-CM

## 2022-02-23 DIAGNOSIS — Z01419 Encounter for gynecological examination (general) (routine) without abnormal findings: Secondary | ICD-10-CM | POA: Diagnosis not present

## 2022-02-23 LAB — WET PREP FOR TRICH, YEAST, CLUE

## 2022-02-23 NOTE — Patient Instructions (Signed)

## 2022-02-24 LAB — URINE CULTURE
MICRO NUMBER:: 14239939
SPECIMEN QUALITY:: ADEQUATE

## 2022-04-12 ENCOUNTER — Other Ambulatory Visit
Admission: RE | Admit: 2022-04-12 | Discharge: 2022-04-12 | Disposition: A | Discharge status: Home / Self Care | Source: Ambulatory Visit | Attending: Rheumatology | Admitting: Rheumatology

## 2022-04-12 DIAGNOSIS — R0609 Other forms of dyspnea: Secondary | ICD-10-CM | POA: Insufficient documentation

## 2022-04-12 LAB — D-DIMER, QUANTITATIVE: D-Dimer, Quant: 0.48 ug/mL-FEU (ref 0.00–0.50)

## 2023-03-07 DIAGNOSIS — R918 Other nonspecific abnormal finding of lung field: Secondary | ICD-10-CM | POA: Diagnosis not present

## 2023-04-03 NOTE — Progress Notes (Signed)
 [facility]  [address] [place],  [place]  [address] [phone]  Clinic Day:  04/05/2023  Referring physician: Sabas Norleen PARAS., MD  HISTORY OF PRESENT ILLNESS:  The patient is a 63 y.o. female who I was asked to consult upon for newly diagnosed lung cancer.  Her history dates back approximately 4 years ago when she first began having shortness of breath.  She claims to have had both cardiology and pulmonary evaluations at that time for which nothing was found.  Nevertheless, her shortness of breath persisted.  Recently, in preparation for left shoulder surgery, as she was found to be extremely short of breath, she was referred to pulmonology for further evaluation.  Per that evaluation, a chest CT was ordered in November 2024, which revealed a right hilar mass, with secondary atelectasis of her right middle and lower lobes.  For further evaluation, a bronchoscopy with brushings/biopsy was done in early December 2024, whose pathology revealed non-small cell carcinoma, whose features favored squamous cell carcinoma.   A PET scan done late last week showed her predominant right lower hilar mass; but there also appeared to be 4 metastatic liver lesions.  Her brain MRI showed no obvious evidence of CNS metastasis.  The patient comes in today to go over all of her biopsy and scan results, as well as their implications.  She denies having any weight loss over the past 12 months.  However, she claims to have had hemoptysis for at least the past 18 months.  She did smoke a pack of cigarettes daily for 40 years before quitting approximately 3 years ago.  PAST MEDICAL HISTORY:   Past Medical History:  Diagnosis Date   Anemia    Anxiety    Depression    GERD (gastroesophageal reflux disease)    History of EKG 08/2011   was reflux-done at [facility] hospital-will request   Hypertension    atenolol  and norvasc    Lung cancer (HCC)    Psoriasis    Psoriatic arthritis  (HCC) 2020   [facility]   Rheumatoid arthritis (HCC) 2017   Seasonal allergies    Smoker     PAST SURGICAL HISTORY:   Past Surgical History:  Procedure Laterality Date   ABDOMINAL HYSTERECTOMY     Laparoscopic total hysterectomy for fibroids and bilateral salpingenctomy   ANKLE SURGERY Left    X 3   COLONOSCOPY W/ POLYPECTOMY     CYSTOSCOPY  11/30/2011   Procedure: CYSTOSCOPY;  Surgeon: Montie SHAUNNA Chesterfield, MD;  Location: [facility];  Service: Gynecology;  Laterality: N/A;   DIAGNOSTIC LAPAROSCOPY  03/30/1983   ectopic   DILATION AND CURETTAGE OF UTERUS     TRANSFORAMINAL LUMBAR INTERBODY FUSION W/ MIS 2 LEVEL N/A 09/11/2020   Procedure: Minimally invasive fusion Lumbar four-five, Lumbar five-Sacral one;  Surgeon: Debby Dorn MATSU, MD;  Location[facility];  Service: Neurosurgery;  Laterality: N/A;   TUBAL LIGATION     WISDOM TOOTH EXTRACTION      CURRENT MEDICATIONS:   Current Outpatient Medications  Medication Sig Dispense Refill   ALPRAZolam  (XANAX ) 0.25 MG tablet Take 0.25 mg by mouth 2 (two) times daily as needed for anxiety.     amLODipine  (NORVASC ) 5 MG tablet Take 5 mg by mouth daily.     atenolol  (TENORMIN ) 50 MG tablet Take 50 mg by mouth daily.     lisinopril  (PRINIVIL ,ZESTRIL ) 20 MG tablet Take 20 mg by mouth daily.     omeprazole (PRILOSEC) 40  MG capsule Take 40 mg by mouth daily.     oxyCODONE  (OXY IR/ROXICODONE ) 5 MG immediate release tablet Take 1 tablet (5 mg total) by mouth every 3 (three) hours as needed for moderate pain ((score 4 to 6)). 30 tablet 0   spironolactone  (ALDACTONE ) 25 MG tablet Take 25 mg by mouth daily.     Tofacitinib  Citrate ER (XELJANZ  XR) 11 MG TB24 Take 11 mg by mouth daily. Okay to restart 09/26/20 30 tablet    venlafaxine  XR (EFFEXOR  XR) 75 MG 24 hr capsule Take 1 capsule (75 mg total) by mouth daily with breakfast. 90 capsule 3   No current facility-administered medications for this visit.    ALLERGIES:   Allergies  Allergen Reactions    Cosentyx [Secukinumab] Hives    FAMILY HISTORY:   Family History  Problem Relation Age of Onset   Osteoporosis Mother    Cancer Mother        renal cell, brain   CAD Father    CAD Paternal Uncle    Parkinson's disease Maternal Grandfather    Cervical cancer Paternal Grandmother    CAD Paternal Grandfather     SOCIAL HISTORY:  The patient was born in [place].  She currently lives in [place].  She is widowed, with 2 children.  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  Her smoking history is mentioned per HPI.  She drinks alcohol occasionally.  REVIEW OF SYSTEMS:  Review of Systems  Constitutional:  Positive for fatigue. Negative for fever.  HENT:   Negative for hearing loss and sore throat.   Eyes:  Negative for eye problems.  Respiratory:  Positive for hemoptysis and shortness of breath. Negative for chest tightness and cough.   Cardiovascular:  Negative for chest pain and palpitations.  Gastrointestinal:  Positive for diarrhea and nausea. Negative for abdominal distention, abdominal pain, blood in stool, constipation and vomiting.  Endocrine: Negative for hot flashes.  Genitourinary:  Negative for difficulty urinating, dysuria, frequency, hematuria and nocturia.   Musculoskeletal:  Positive for gait problem and myalgias. Negative for arthralgias and back pain.  Skin: Negative.  Negative for itching and rash.  Neurological:  Positive for gait problem. Negative for dizziness, extremity weakness, headaches, light-headedness and numbness.  Hematological: Negative.   Psychiatric/Behavioral:  Negative for depression and suicidal ideas. The patient is nervous/anxious.     PHYSICAL EXAM:  Blood pressure (!) 141/88, pulse 96, temperature 97.8 F (36.6 C), temperature source Oral, resp. rate 16, height 6' 1 (1.854 m), weight 267 lb 8 oz (121.3 kg), last menstrual period 11/15/2011, SpO2 96%. Wt Readings from Last 3 Encounters:  04/05/23 267 lb 8 oz (121.3 kg)   02/23/22 263 lb (119.3 kg)  01/21/21 246 lb (111.6 kg)   Body mass index is 35.29 kg/m. Performance status (ECOG): 1 - Symptomatic but completely ambulatory Physical Exam Constitutional:      Appearance: Normal appearance. She is obese. She is not ill-appearing.     Comments: She is in a wheelchair  HENT:     Mouth/Throat:     Mouth: Mucous membranes are moist.     Pharynx: Oropharynx is clear. No oropharyngeal exudate or posterior oropharyngeal erythema.  Cardiovascular:     Rate and Rhythm: Normal rate and regular rhythm.     Heart sounds: No murmur heard.    No friction rub. No gallop.  Pulmonary:     Effort: Pulmonary effort is normal. No respiratory distress.     Breath  sounds: Normal breath sounds. No wheezing, rhonchi or rales.  Abdominal:     General: Bowel sounds are normal. There is no distension.     Palpations: Abdomen is soft. There is no mass.     Tenderness: There is no abdominal tenderness.  Musculoskeletal:        General: No swelling.     Right lower leg: No edema.     Left lower leg: No edema.  Lymphadenopathy:     Cervical: No cervical adenopathy.     Upper Body:     Right upper body: No supraclavicular or axillary adenopathy.     Left upper body: No supraclavicular or axillary adenopathy.     Lower Body: No right inguinal adenopathy. No left inguinal adenopathy.  Skin:    General: Skin is warm.     Coloration: Skin is not jaundiced.     Findings: No lesion or rash.  Neurological:     General: No focal deficit present.     Mental Status: She is alert and oriented to person, place, and time. Mental status is at baseline.  Psychiatric:        Mood and Affect: Mood normal.        Behavior: Behavior normal.        Thought Content: Thought content normal.    LABS:      Latest Ref Rng & Units 04/05/2023    1:34 PM 09/11/2020    5:09 PM 09/11/2020    2:27 PM  CBC  WBC 4.0 - 10.5 K/uL 6.8     Hemoglobin 12.0 - 15.0 g/dL 87.9  88.7  87.3   Hematocrit  36.0 - 46.0 % 34.2  33.0  37.0   Platelets 150 - 400 K/uL 293         Latest Ref Rng & Units 04/05/2023    1:34 PM 09/11/2020    5:09 PM 09/11/2020    2:27 PM  CMP  Glucose 70 - 99 mg/dL 851     BUN 8 - 23 mg/dL 7     Creatinine 9.55 - 1.00 mg/dL 9.29     Sodium 864 - 854 mmol/L 129  136  135   Potassium 3.5 - 5.1 mmol/L 4.7  3.7  3.5   Chloride 98 - 111 mmol/L 93     CO2 22 - 32 mmol/L 24     Calcium 8.9 - 10.3 mg/dL 89.5     Total Protein 6.5 - 8.1 g/dL 7.7     Total Bilirubin 0.0 - 1.2 mg/dL 0.3     Alkaline Phos 38 - 126 U/L 107     AST 15 - 41 U/L 35     ALT 0 - 44 U/L 27       ASSESSMENT & PLAN:  A 63 y.o. female who I was asked to consult upon for what appears to be findings concerning for stage IV squamous cell lung cancer.  In clinic today, I went over all of her recent scans with her, for which she could see the hypermetabolic lesions in her liver that are worrisome for metastatic disease. I will have her undergo a biopsy of 1 of these liver lesions to confirm stage IV disease.  If this is the case, the patient understands that all treatment to be given for her disease management would be with palliative intent.  She is scheduled to see radiation oncology this week.  Although I normally do not consider giving a patient radiation in the stage  IV setting, it may be prudent to give her palliative radiation to her lung mass as she has had hemoptysis for numerous months.  I will also have her peripheral blood sent for Guardant360 testing to determine if her disease may be amenable to some form of targeted therapy.  Otherwise, I will see this patient back in 3 weeks to go over her liver biopsy results, Guardant360 test results, and their implications.  The patient understands all the plans discussed today and is in agreement with them.  I do appreciate Sl[place]ky, Norleen PARAS., MD for his new consult.   Elara Cocke DELENA Kerns, MD

## 2023-04-05 ENCOUNTER — Inpatient Hospital Stay: Attending: Oncology | Admitting: Oncology

## 2023-04-05 ENCOUNTER — Inpatient Hospital Stay

## 2023-04-05 ENCOUNTER — Encounter: Payer: Self-pay | Admitting: Oncology

## 2023-04-05 ENCOUNTER — Other Ambulatory Visit: Payer: Self-pay | Admitting: Oncology

## 2023-04-05 VITALS — BP 141/88 | HR 96 | Temp 97.8°F | Resp 16 | Ht 73.0 in | Wt 267.5 lb

## 2023-04-05 DIAGNOSIS — C787 Secondary malignant neoplasm of liver and intrahepatic bile duct: Secondary | ICD-10-CM | POA: Diagnosis not present

## 2023-04-05 DIAGNOSIS — Z510 Encounter for antineoplastic radiation therapy: Secondary | ICD-10-CM | POA: Insufficient documentation

## 2023-04-05 DIAGNOSIS — L4050 Arthropathic psoriasis, unspecified: Secondary | ICD-10-CM | POA: Insufficient documentation

## 2023-04-05 DIAGNOSIS — C3411 Malignant neoplasm of upper lobe, right bronchus or lung: Secondary | ICD-10-CM

## 2023-04-05 DIAGNOSIS — R042 Hemoptysis: Secondary | ICD-10-CM | POA: Insufficient documentation

## 2023-04-05 DIAGNOSIS — C3431 Malignant neoplasm of lower lobe, right bronchus or lung: Secondary | ICD-10-CM | POA: Insufficient documentation

## 2023-04-05 DIAGNOSIS — C3401 Malignant neoplasm of right main bronchus: Secondary | ICD-10-CM | POA: Insufficient documentation

## 2023-04-05 DIAGNOSIS — C3490 Malignant neoplasm of unspecified part of unspecified bronchus or lung: Secondary | ICD-10-CM

## 2023-04-05 LAB — CBC WITH DIFFERENTIAL (CANCER CENTER ONLY)
Abs Immature Granulocytes: 0.05 10*3/uL (ref 0.00–0.07)
Basophils Absolute: 0 10*3/uL (ref 0.0–0.1)
Basophils Relative: 0 %
Eosinophils Absolute: 0 10*3/uL (ref 0.0–0.5)
Eosinophils Relative: 0 %
HCT: 34.2 % — ABNORMAL LOW (ref 36.0–46.0)
Hemoglobin: 12 g/dL (ref 12.0–15.0)
Immature Granulocytes: 1 %
Lymphocytes Relative: 12 %
Lymphs Abs: 0.8 10*3/uL (ref 0.7–4.0)
MCH: 31.5 pg (ref 26.0–34.0)
MCHC: 35.1 g/dL (ref 30.0–36.0)
MCV: 89.8 fL (ref 80.0–100.0)
Monocytes Absolute: 0.5 10*3/uL (ref 0.1–1.0)
Monocytes Relative: 8 %
Neutro Abs: 5.4 10*3/uL (ref 1.7–7.7)
Neutrophils Relative %: 79 %
Platelet Count: 293 10*3/uL (ref 150–400)
RBC: 3.81 MIL/uL — ABNORMAL LOW (ref 3.87–5.11)
RDW: 12.8 % (ref 11.5–15.5)
WBC Count: 6.8 10*3/uL (ref 4.0–10.5)
nRBC: 0 % (ref 0.0–0.2)
nRBC: 0 /100{WBCs}

## 2023-04-05 LAB — CMP (CANCER CENTER ONLY)
ALT: 27 U/L (ref 0–44)
AST: 35 U/L (ref 15–41)
Albumin: 4.4 g/dL (ref 3.5–5.0)
Alkaline Phosphatase: 107 U/L (ref 38–126)
Anion gap: 13 (ref 5–15)
BUN: 7 mg/dL — ABNORMAL LOW (ref 8–23)
CO2: 24 mmol/L (ref 22–32)
Calcium: 10.4 mg/dL — ABNORMAL HIGH (ref 8.9–10.3)
Chloride: 93 mmol/L — ABNORMAL LOW (ref 98–111)
Creatinine: 0.7 mg/dL (ref 0.44–1.00)
GFR, Estimated: >60 mL/min (ref >60)
Glucose, Bld: 148 mg/dL — ABNORMAL HIGH (ref 70–99)
Potassium: 4.7 mmol/L (ref 3.5–5.1)
Sodium: 129 mmol/L — ABNORMAL LOW (ref 135–145)
Total Bilirubin: 0.3 mg/dL (ref 0.0–1.2)
Total Protein: 7.7 g/dL (ref 6.5–8.1)

## 2023-04-07 ENCOUNTER — Encounter: Payer: Self-pay | Admitting: Radiation Oncology

## 2023-04-07 ENCOUNTER — Ambulatory Visit
Admission: RE | Admit: 2023-04-07 | Discharge: 2023-04-07 | Disposition: A | Discharge status: Home / Self Care | Source: Ambulatory Visit | Attending: Radiation Oncology | Admitting: Radiation Oncology

## 2023-04-07 VITALS — BP 142/92 | HR 73 | Temp 98.6°F | Resp 18 | Ht 73.0 in | Wt 266.5 lb

## 2023-04-07 DIAGNOSIS — Z79899 Other long term (current) drug therapy: Secondary | ICD-10-CM | POA: Diagnosis not present

## 2023-04-07 DIAGNOSIS — C3431 Malignant neoplasm of lower lobe, right bronchus or lung: Secondary | ICD-10-CM | POA: Insufficient documentation

## 2023-04-07 DIAGNOSIS — L4050 Arthropathic psoriasis, unspecified: Secondary | ICD-10-CM | POA: Insufficient documentation

## 2023-04-07 DIAGNOSIS — M069 Rheumatoid arthritis, unspecified: Secondary | ICD-10-CM | POA: Diagnosis not present

## 2023-04-07 DIAGNOSIS — I10 Essential (primary) hypertension: Secondary | ICD-10-CM | POA: Insufficient documentation

## 2023-04-07 DIAGNOSIS — R599 Enlarged lymph nodes, unspecified: Secondary | ICD-10-CM | POA: Diagnosis not present

## 2023-04-07 DIAGNOSIS — Z87891 Personal history of nicotine dependence: Secondary | ICD-10-CM | POA: Insufficient documentation

## 2023-04-07 DIAGNOSIS — Z808 Family history of malignant neoplasm of other organs or systems: Secondary | ICD-10-CM | POA: Diagnosis not present

## 2023-04-07 DIAGNOSIS — K219 Gastro-esophageal reflux disease without esophagitis: Secondary | ICD-10-CM | POA: Insufficient documentation

## 2023-04-07 DIAGNOSIS — C787 Secondary malignant neoplasm of liver and intrahepatic bile duct: Secondary | ICD-10-CM | POA: Diagnosis not present

## 2023-04-07 HISTORY — DX: Shortness of breath: R06.02

## 2023-04-08 NOTE — Progress Notes (Signed)
 Radiation Oncology         [phone] ________________________________  Name: Susan Franco        MRN: [id number]  Date of Service: 04/07/2023 DOB: 01-Sep-1960  RR:Dojundxb, Norleen PARAS., MD       REFERRING PHYSICIAN: Ezzard Peters, MD  DIAGNOSIS: Squamous cell carcinoma of the lung, metastatic   HISTORY OF PRESENT ILLNESS: Susan Franco is a 63 y.o. female seen at the request of Dr. Ezzard.  While undergoing workup for shoulder surgery, she was found to be short of breath, prompting a CT scan of the chest in late 2024.  This revealed a right hilar mass, with atelectasis involving her right lower and middle lobes.  Bronchoscopy was done in December, with pathology revealing non-small cell lung carcinoma, favoring squamous cell carcinoma.  PET/CT imaging was performed earlier this month, revealing a 3.3 cm right lower lobe infrahilar mass, with a maximum SUV of 28.8.  Also noted was right paraesophageal adenopathy.  Unfortunately, for hypermetabolic hepatic lesions were seen, felt to be compatible with metastatic disease.  Biopsy of the apparent metastatic lesions in her liver is planned.  Consultation is requested regarding the potential role of radiation in her care.   PREVIOUS RADIATION THERAPY: No   PAST MEDICAL HISTORY:  Past Medical History:  Diagnosis Date   Anemia    Anxiety    Depression    GERD (gastroesophageal reflux disease)    History of EKG 08/2011   was reflux-done at [facility] hospital-will request   Hypertension    atenolol  and norvasc    Lung cancer (HCC)    Psoriasis    Psoriatic arthritis (HCC) 2020   [facility]   Rheumatoid arthritis (HCC) 2017   Seasonal allergies    Smoker    SOB (shortness of breath)        PAST SURGICAL HISTORY: Past Surgical History:  Procedure Laterality Date   ABDOMINAL HYSTERECTOMY     Laparoscopic total hysterectomy for fibroids and bilateral salpingenctomy   ANKLE SURGERY Left    X 3   COLONOSCOPY W/ POLYPECTOMY     CYSTOSCOPY   11/30/2011   Procedure: CYSTOSCOPY;  Surgeon: Montie SHAUNNA Chesterfield, MD;  Location: [facility];  Service: Gynecology;  Laterality: N/A;   DIAGNOSTIC LAPAROSCOPY  03/30/1983   ectopic   DILATION AND CURETTAGE OF UTERUS     TRANSFORAMINAL LUMBAR INTERBODY FUSION W/ MIS 2 LEVEL N/A 09/11/2020   Procedure: Minimally invasive fusion Lumbar four-five, Lumbar five-Sacral one;  Surgeon: Debby Dorn MATSU, MD;  Location: [facility] OR;  Service: Neurosurgery;  Laterality: N/A;   TUBAL LIGATION     WISDOM TOOTH EXTRACTION       FAMILY HISTORY:  Family History  Problem Relation Age of Onset   Osteoporosis Mother    Cancer Mother        renal cell, brain   CAD Father    CAD Paternal Uncle    Parkinson's disease Maternal Grandfather    Cervical cancer Paternal Grandmother    CAD Paternal Grandfather      SOCIAL HISTORY: She reports that she quit smoking about 3 years ago. Her smoking use included cigarettes. She started smoking about 42 years ago. She has a 39 pack-year smoking history. She has never used smokeless tobacco. She reports current alcohol use of about 2.0 standard drinks of alcohol per week. She reports that she does not use drugs.   ALLERGIES: Cosentyx [secukinumab]   MEDICATIONS:  Current Outpatient Medications  Medication Sig Dispense Refill  ALPRAZolam  (XANAX ) 0.25 MG tablet Take 0.25 mg by mouth 2 (two) times daily as needed for anxiety.     amLODipine  (NORVASC ) 5 MG tablet Take 5 mg by mouth daily.     atenolol  (TENORMIN ) 50 MG tablet Take 50 mg by mouth daily.     lisinopril  (PRINIVIL ,ZESTRIL ) 20 MG tablet Take 20 mg by mouth daily.     omeprazole (PRILOSEC) 40 MG capsule Take 40 mg by mouth daily.     oxyCODONE  (OXY IR/ROXICODONE ) 5 MG immediate release tablet Take 1 tablet (5 mg total) by mouth every 3 (three) hours as needed for moderate pain ((score 4 to 6)). 30 tablet 0   spironolactone  (ALDACTONE ) 25 MG tablet Take 25 mg by mouth daily.     Tofacitinib  Citrate ER (XELJANZ  XR)  11 MG TB24 Take 11 mg by mouth daily. Okay to restart 09/26/20 30 tablet    venlafaxine  XR (EFFEXOR  XR) 75 MG 24 hr capsule Take 1 capsule (75 mg total) by mouth daily with breakfast. 90 capsule 3   No current facility-administered medications for this encounter.     REVIEW OF SYSTEMS: A 12 point review of systems was performed, and reviewed by me with the patient.  She reports her appetite is somewhat depressed, though she denies weight loss.  She reports no fevers or chills.  She reports no shortness of breath at rest, but also reports dyspnea with minimal exertion.  She reports hemoptysis typically 3 times a week; sometimes this is just a small tinge of blood, but other times it is a larger amount.  She reports no chest pain.  She denies any bowel or bladder disturbances, and denies abdominal pain, nausea or vomiting.  She denies any new musculoskeletal or joint aches or pains. A complete review of systems is obtained and is otherwise negative.     PHYSICAL EXAM:  Wt Readings from Last 3 Encounters:  04/07/23 266 lb 8 oz (120.9 kg)  04/05/23 267 lb 8 oz (121.3 kg)  02/23/22 263 lb (119.3 kg)   Temp Readings from Last 3 Encounters:  04/07/23 98.6 F (37 C) (Oral)  04/05/23 97.8 F (36.6 C) (Oral)  09/13/20 97.7 F (36.5 C) (Oral)   BP Readings from Last 3 Encounters:  04/07/23 (!) 142/92  04/05/23 (!) 141/88  02/23/22 134/82   Pulse Readings from Last 3 Encounters:  04/07/23 73  04/05/23 96  02/23/22 80   Pain Assessment Pain Score: 0-No pain/10  In general this is a well appearing female in no acute distress.  She's alert and oriented x4 and appropriate throughout the examination.  No cervical or supraclavicular lymphadenopathy is appreciated.  Oropharynx is clear.  Abdomen is nontender and nondistended.  Heart demonstrates a regular rate and rhythm.  Examination of her lungs reveals decreased breath sounds bilaterally.   ECOG = 1  0 - Asymptomatic (Fully active, able to  carry on all predisease activities without restriction)  1 - Symptomatic but completely ambulatory (Restricted in physically strenuous activity but ambulatory and able to carry out work of a light or sedentary nature. For example, light housework, office work)  2 - Symptomatic, <50% in bed during the day (Ambulatory and capable of all self care but unable to carry out any work activities. Up and about more than 50% of waking hours)  3 - Symptomatic, >50% in bed, but not bedbound (Capable of only limited self-care, confined to bed or chair 50% or more of waking hours)  4 - Bedbound (Completely  disabled. Cannot carry on any self-care. Totally confined to bed or chair)  5 - Death   Raylene MM, Creech RH, Tormey DC, et al. [phone]). Toxicity and response criteria of the [facility]. Am. DOROTHA Bridges. Oncol. 5 (6): 649-55    LABORATORY DATA:  Lab Results  Component Value Date   WBC 6.8 04/05/2023   HGB 12.0 04/05/2023   HCT 34.2 (L) 04/05/2023   MCV 89.8 04/05/2023   PLT 293 04/05/2023   Lab Results  Component Value Date   NA 129 (L) 04/05/2023   K 4.7 04/05/2023   CL 93 (L) 04/05/2023   CO2 24 04/05/2023   Lab Results  Component Value Date   ALT 27 04/05/2023   AST 35 04/05/2023   ALKPHOS 107 04/05/2023   BILITOT 0.3 04/05/2023      RADIOGRAPHY: Please note that I have personally reviewed her PET CT scan from January 2025, with the results documented above.     IMPRESSION/PLAN: 1.  The patient is a 63 year old female with squamous cell carcinoma of the right lung, with apparent metastatic involvement of her liver.  Biopsy is planned for confirmation of the nature of her liver findings.  While the patient likely has metastatic disease, I think she would benefit from a short course of thoracic radiation, to hopefully reduce her hemoptysis and perhaps to improve her pulmonary function.  She is agreeable.  I reviewed the potential side effects of thoracic radiation  in her clinical scenario, explaining that they may include, but are not limited to, skin irritation, fatigue, and irritation of the esophagus with resultant discomfort with swallowing.  I explained that potential long-term side effects of radiation may include, but are not limited to, injury to normal lung tissue.  She expressed understanding, and is agreeable to proceed.  Arrangements will be made for her to return to our department for simulation and treatment planning.   In a visit lasting 60 minutes, greater than 50% of the time was spent face to face discussing the patient's condition, in preparation for the discussion, and coordinating the patient's care.    Arleatha Philipps A. Jomarie, MD   **Disclaimer: This note was dictated with voice recognition software. Similar sounding words can inadvertently be transcribed and this note may contain transcription errors which may not have been corrected upon publication of note.**

## 2023-04-12 ENCOUNTER — Ambulatory Visit: Admitting: Radiation Oncology

## 2023-04-12 ENCOUNTER — Ambulatory Visit
Admission: RE | Admit: 2023-04-12 | Discharge: 2023-04-12 | Disposition: A | Discharge status: Home / Self Care | Source: Ambulatory Visit | Attending: Radiation Oncology | Admitting: Radiation Oncology

## 2023-04-12 DIAGNOSIS — C3431 Malignant neoplasm of lower lobe, right bronchus or lung: Secondary | ICD-10-CM | POA: Insufficient documentation

## 2023-04-12 DIAGNOSIS — C787 Secondary malignant neoplasm of liver and intrahepatic bile duct: Secondary | ICD-10-CM | POA: Diagnosis not present

## 2023-04-12 DIAGNOSIS — Z510 Encounter for antineoplastic radiation therapy: Secondary | ICD-10-CM | POA: Insufficient documentation

## 2023-04-19 ENCOUNTER — Ambulatory Visit
Admission: RE | Admit: 2023-04-19 | Discharge: 2023-04-19 | Discharge status: Home / Self Care | Source: Ambulatory Visit | Attending: Radiation Oncology | Admitting: Radiation Oncology

## 2023-04-19 ENCOUNTER — Other Ambulatory Visit: Payer: Self-pay

## 2023-04-19 ENCOUNTER — Ambulatory Visit
Admission: RE | Admit: 2023-04-19 | Discharge: 2023-04-19 | Disposition: A | Discharge status: Home / Self Care | Source: Ambulatory Visit | Attending: Radiation Oncology | Admitting: Radiation Oncology

## 2023-04-19 DIAGNOSIS — Z510 Encounter for antineoplastic radiation therapy: Secondary | ICD-10-CM | POA: Insufficient documentation

## 2023-04-19 DIAGNOSIS — C3431 Malignant neoplasm of lower lobe, right bronchus or lung: Secondary | ICD-10-CM | POA: Insufficient documentation

## 2023-04-19 LAB — RAD ONC ARIA SESSION SUMMARY
Course Elapsed Days: 0
Plan Fractions Treated to Date: 1
Plan Prescribed Dose Per Fraction: 2.5 Gy
Plan Total Fractions Prescribed: 15
Plan Total Prescribed Dose: 37.5 Gy
Reference Point Dosage Given to Date: 2.5 Gy
Reference Point Session Dosage Given: 2.5 Gy
Session Number: 1

## 2023-04-20 ENCOUNTER — Ambulatory Visit
Admission: RE | Admit: 2023-04-20 | Discharge: 2023-04-20 | Disposition: A | Discharge status: Home / Self Care | Source: Ambulatory Visit | Attending: Radiation Oncology

## 2023-04-20 ENCOUNTER — Other Ambulatory Visit: Payer: Self-pay

## 2023-04-20 DIAGNOSIS — Z510 Encounter for antineoplastic radiation therapy: Secondary | ICD-10-CM | POA: Diagnosis not present

## 2023-04-20 LAB — RAD ONC ARIA SESSION SUMMARY
Course Elapsed Days: 1
Plan Fractions Treated to Date: 1
Plan Prescribed Dose Per Fraction: 2.5 Gy
Plan Total Fractions Prescribed: 14
Plan Total Prescribed Dose: 35 Gy
Reference Point Dosage Given to Date: 5 Gy
Reference Point Session Dosage Given: 2.5 Gy
Session Number: 2

## 2023-04-20 NOTE — Progress Notes (Signed)
Simonne Come, MD  Claudean Kinds OK for attempted US guided liver lesion Bx.  PET CT - 04/01/23 - image 88, series 604.   Nedra Hai       Previous Messages    ----- Message ----- From: Claudean Kinds Sent: 04/19/2023   9:01 AM EST To: Claudean Kinds; Ir Procedure Requests Subject: US liver biopsy                                Procedure: US liver biopsy  Reason: confirm presence of stage IV lung cancer Dx: Malignant neoplasm of upper lobe of right lung (HCC) [C34.11 (ICD-10-CM)]    History : MRI lumbar spine, ct lumbar spine , NM PET skull base to thigh ( in PACS), MR Head w/wo and CT chest w/ ( in PACS)    Provider : Weston Settle, MD   Provider contact: [phone]

## 2023-04-21 ENCOUNTER — Ambulatory Visit
Admission: RE | Admit: 2023-04-21 | Discharge: 2023-04-21 | Disposition: A | Discharge status: Home / Self Care | Source: Ambulatory Visit | Attending: Radiation Oncology | Admitting: Radiation Oncology

## 2023-04-21 ENCOUNTER — Other Ambulatory Visit: Payer: Self-pay

## 2023-04-21 DIAGNOSIS — Z510 Encounter for antineoplastic radiation therapy: Secondary | ICD-10-CM | POA: Diagnosis not present

## 2023-04-21 LAB — RAD ONC ARIA SESSION SUMMARY
Course Elapsed Days: 2
Plan Fractions Treated to Date: 2
Plan Prescribed Dose Per Fraction: 2.5 Gy
Plan Total Fractions Prescribed: 14
Plan Total Prescribed Dose: 35 Gy
Reference Point Dosage Given to Date: 7.5 Gy
Reference Point Session Dosage Given: 2.5 Gy
Session Number: 3

## 2023-04-21 NOTE — Progress Notes (Signed)
This information was for the sake of discussion at Tumor board only. It is not an official part  of the treatment plan.

## 2023-04-22 ENCOUNTER — Ambulatory Visit
Admission: RE | Admit: 2023-04-22 | Discharge: 2023-04-22 | Disposition: A | Discharge status: Home / Self Care | Source: Ambulatory Visit | Attending: Radiation Oncology | Admitting: Radiation Oncology

## 2023-04-22 ENCOUNTER — Other Ambulatory Visit: Payer: Self-pay

## 2023-04-22 DIAGNOSIS — Z510 Encounter for antineoplastic radiation therapy: Secondary | ICD-10-CM | POA: Diagnosis not present

## 2023-04-22 LAB — RAD ONC ARIA SESSION SUMMARY
Course Elapsed Days: 3
Plan Fractions Treated to Date: 3
Plan Prescribed Dose Per Fraction: 2.5 Gy
Plan Total Fractions Prescribed: 14
Plan Total Prescribed Dose: 35 Gy
Reference Point Dosage Given to Date: 10 Gy
Reference Point Session Dosage Given: 2.5 Gy
Session Number: 4

## 2023-04-25 ENCOUNTER — Ambulatory Visit: Admission: RE | Admit: 2023-04-25 | Source: Ambulatory Visit

## 2023-04-25 NOTE — Progress Notes (Unsigned)
[facility]  [address] [place],  [place]  [address] [phone]  Clinic Day:  04/26/2023  Referring physician: Nonnie Done., MD  HISTORY OF PRESENT ILLNESS:  The patient is a 63 y.o. female who I recently began seeing for what clinically appears to be stage IVB (T2a N2a M1c1) squamous cell lung cancer, which includes spread of disease to his liver.  She comes in today to review her Guardant results.  Since her last visit, the patient has been doing okay.  She is currently undergoing palliative radiation as she presented with hemoptysis.  Although not completely gone, her hemoptysis has improved.  She has thus far completed 4 of 14 planned radiation treatments.  This patient has yet to undergo her liver biopsy to definitively prove metastatic squamous cell cancer to her liver.  She denies having other symptoms which concern her for overt signs of disease progression.    PHYSICAL EXAM:  Blood pressure 130/81, pulse 85, temperature 98 F (36.7 C), temperature source Oral, resp. rate 20, height 6\' 1"  (1.854 m), weight 263 lb 11.2 oz (119.6 kg), last menstrual period 11/15/2011, SpO2 98%. Wt Readings from Last 3 Encounters:  04/26/23 263 lb 11.2 oz (119.6 kg)  04/07/23 266 lb 8 oz (120.9 kg)  04/05/23 267 lb 8 oz (121.3 kg)   Body mass index is 34.79 kg/m. Performance status (ECOG): 1 - Symptomatic but completely ambulatory Physical Exam Constitutional:      Appearance: Normal appearance. She is obese. She is not ill-appearing.     Comments: She is in a wheelchair  HENT:     Mouth/Throat:     Mouth: Mucous membranes are moist.     Pharynx: Oropharynx is clear. No oropharyngeal exudate or posterior oropharyngeal erythema.  Cardiovascular:     Rate and Rhythm: Normal rate and regular rhythm.     Heart sounds: No murmur heard.    No friction rub. No gallop.  Pulmonary:     Effort: Pulmonary effort is normal. No respiratory distress.     Breath  sounds: Normal breath sounds. No wheezing, rhonchi or rales.  Abdominal:     General: Bowel sounds are normal. There is no distension.     Palpations: Abdomen is soft. There is no mass.     Tenderness: There is no abdominal tenderness.  Musculoskeletal:        General: No swelling.     Right lower leg: No edema.     Left lower leg: No edema.  Lymphadenopathy:     Cervical: No cervical adenopathy.     Upper Body:     Right upper body: No supraclavicular or axillary adenopathy.     Left upper body: No supraclavicular or axillary adenopathy.     Lower Body: No right inguinal adenopathy. No left inguinal adenopathy.  Skin:    General: Skin is warm.     Coloration: Skin is not jaundiced.     Findings: No lesion or rash.  Neurological:     General: No focal deficit present.     Mental Status: She is alert and oriented to person, place, and time. Mental status is at baseline.  Psychiatric:        Mood and Affect: Mood normal.        Behavior: Behavior normal.        Thought Content: Thought content normal.   TEST RESULTS:  Her Guardant test results unfortunately did not reveal her tumor to harbor any mutations/alterations  to where targeted therapy could be considered.  LABS:      Latest Ref Rng & Units 04/05/2023    1:34 PM 09/11/2020    5:09 PM 09/11/2020    2:27 PM  CBC  WBC 4.0 - 10.5 K/uL 6.8     Hemoglobin 12.0 - 15.0 g/dL 84.6  96.2  95.2   Hematocrit 36.0 - 46.0 % 34.2  33.0  37.0   Platelets 150 - 400 K/uL 293         Latest Ref Rng & Units 04/05/2023    1:34 PM 09/11/2020    5:09 PM 09/11/2020    2:27 PM  CMP  Glucose 70 - 99 mg/dL 841     BUN 8 - 23 mg/dL 7     Creatinine 3.24 - 1.00 mg/dL 4.01     Sodium 027 - 253 mmol/L 129  136  135   Potassium 3.5 - 5.1 mmol/L 4.7  3.7  3.5   Chloride 98 - 111 mmol/L 93     CO2 22 - 32 mmol/L 24     Calcium 8.9 - 10.3 mg/dL 66.4     Total Protein 6.5 - 8.1 g/dL 7.7     Total Bilirubin 0.0 - 1.2 mg/dL 0.3     Alkaline Phos 38 -  126 U/L 107     AST 15 - 41 U/L 35     ALT 0 - 44 U/L 27       ASSESSMENT & PLAN:  A 63 y.o. female who clinically appears to have stage IVB squamous cell lung cancer.  In clinic today, I went over her Guardant test results with her, which unfortunately showed that her cancer does not possess any mutations to where targeted therapy can be considered.  This will essentially relegate her to standard chemotherapy.  Of note, this patient has severe psoriatic arthritis, which serves as an impediment for her to receive some form of immunotherapy.  This essentially relegates her to some form of platinum-based chemotherapy regimen.  For now, the patient will complete her palliative radiation to hopefully completely eradicate her hemoptysis.  Her liver biopsy is also scheduled in 2 weeks.  I will see this patient back in 3 weeks to go over her biopsy results and discuss what her palliative treatment plan will be. The patient understands all the plans discussed today and is in agreement with them.  Raja Liska Kirby Funk, MD

## 2023-04-26 ENCOUNTER — Ambulatory Visit
Admission: RE | Admit: 2023-04-26 | Discharge: 2023-04-26 | Disposition: A | Discharge status: Home / Self Care | Source: Ambulatory Visit | Attending: Radiation Oncology | Admitting: Radiation Oncology

## 2023-04-26 ENCOUNTER — Other Ambulatory Visit: Payer: Self-pay

## 2023-04-26 ENCOUNTER — Telehealth: Payer: Self-pay | Admitting: Oncology

## 2023-04-26 ENCOUNTER — Other Ambulatory Visit: Payer: Self-pay | Admitting: Oncology

## 2023-04-26 ENCOUNTER — Inpatient Hospital Stay (HOSPITAL_BASED_OUTPATIENT_CLINIC_OR_DEPARTMENT_OTHER): Admitting: Oncology

## 2023-04-26 VITALS — BP 130/81 | HR 85 | Temp 98.0°F | Resp 20 | Ht 73.0 in | Wt 263.7 lb

## 2023-04-26 DIAGNOSIS — C3401 Malignant neoplasm of right main bronchus: Secondary | ICD-10-CM | POA: Diagnosis not present

## 2023-04-26 DIAGNOSIS — Z510 Encounter for antineoplastic radiation therapy: Secondary | ICD-10-CM | POA: Diagnosis not present

## 2023-04-26 LAB — RAD ONC ARIA SESSION SUMMARY
Course Elapsed Days: 7
Plan Fractions Treated to Date: 4
Plan Prescribed Dose Per Fraction: 2.5 Gy
Plan Total Fractions Prescribed: 14
Plan Total Prescribed Dose: 35 Gy
Reference Point Dosage Given to Date: 12.5 Gy
Reference Point Session Dosage Given: 2.5 Gy
Session Number: 5

## 2023-04-26 NOTE — Telephone Encounter (Signed)
Patient has been scheduled for follow-up visit per 04/26/23 LOS.  Pt given an appt calendar with date and time.

## 2023-04-27 ENCOUNTER — Ambulatory Visit
Admission: RE | Admit: 2023-04-27 | Discharge: 2023-04-27 | Disposition: A | Discharge status: Home / Self Care | Source: Ambulatory Visit | Attending: Radiation Oncology | Admitting: Radiation Oncology

## 2023-04-27 ENCOUNTER — Other Ambulatory Visit: Payer: Self-pay

## 2023-04-27 DIAGNOSIS — Z510 Encounter for antineoplastic radiation therapy: Secondary | ICD-10-CM | POA: Diagnosis not present

## 2023-04-27 LAB — RAD ONC ARIA SESSION SUMMARY
Course Elapsed Days: 8
Plan Fractions Treated to Date: 5
Plan Prescribed Dose Per Fraction: 2.5 Gy
Plan Total Fractions Prescribed: 14
Plan Total Prescribed Dose: 35 Gy
Reference Point Dosage Given to Date: 15 Gy
Reference Point Session Dosage Given: 2.5 Gy
Session Number: 6

## 2023-04-28 ENCOUNTER — Other Ambulatory Visit: Payer: Self-pay

## 2023-04-28 ENCOUNTER — Ambulatory Visit
Admission: RE | Admit: 2023-04-28 | Discharge: 2023-04-28 | Disposition: A | Discharge status: Home / Self Care | Source: Ambulatory Visit | Attending: Radiation Oncology | Admitting: Radiation Oncology

## 2023-04-28 DIAGNOSIS — Z510 Encounter for antineoplastic radiation therapy: Secondary | ICD-10-CM | POA: Diagnosis not present

## 2023-04-28 LAB — RAD ONC ARIA SESSION SUMMARY
Course Elapsed Days: 9
Plan Fractions Treated to Date: 6
Plan Prescribed Dose Per Fraction: 2.5 Gy
Plan Total Fractions Prescribed: 14
Plan Total Prescribed Dose: 35 Gy
Reference Point Dosage Given to Date: 17.5 Gy
Reference Point Session Dosage Given: 2.5 Gy
Session Number: 7

## 2023-04-29 ENCOUNTER — Other Ambulatory Visit: Payer: Self-pay

## 2023-04-29 ENCOUNTER — Ambulatory Visit
Admission: RE | Admit: 2023-04-29 | Discharge: 2023-04-29 | Disposition: A | Discharge status: Home / Self Care | Source: Ambulatory Visit | Attending: Radiation Oncology

## 2023-04-29 DIAGNOSIS — Z510 Encounter for antineoplastic radiation therapy: Secondary | ICD-10-CM | POA: Diagnosis not present

## 2023-04-29 LAB — RAD ONC ARIA SESSION SUMMARY
Course Elapsed Days: 10
Plan Fractions Treated to Date: 7
Plan Prescribed Dose Per Fraction: 2.5 Gy
Plan Total Fractions Prescribed: 14
Plan Total Prescribed Dose: 35 Gy
Reference Point Dosage Given to Date: 20 Gy
Reference Point Session Dosage Given: 2.5 Gy
Session Number: 8

## 2023-05-02 ENCOUNTER — Ambulatory Visit
Admission: RE | Admit: 2023-05-02 | Discharge: 2023-05-02 | Disposition: A | Discharge status: Home / Self Care | Source: Ambulatory Visit | Attending: Radiation Oncology | Admitting: Radiation Oncology

## 2023-05-02 ENCOUNTER — Other Ambulatory Visit: Payer: Self-pay

## 2023-05-02 DIAGNOSIS — Z510 Encounter for antineoplastic radiation therapy: Secondary | ICD-10-CM | POA: Insufficient documentation

## 2023-05-02 DIAGNOSIS — C3431 Malignant neoplasm of lower lobe, right bronchus or lung: Secondary | ICD-10-CM | POA: Diagnosis present

## 2023-05-02 LAB — RAD ONC ARIA SESSION SUMMARY
Course Elapsed Days: 13
Plan Fractions Treated to Date: 8
Plan Prescribed Dose Per Fraction: 2.5 Gy
Plan Total Fractions Prescribed: 14
Plan Total Prescribed Dose: 35 Gy
Reference Point Dosage Given to Date: 22.5 Gy
Reference Point Session Dosage Given: 2.5 Gy
Session Number: 9

## 2023-05-03 ENCOUNTER — Other Ambulatory Visit: Payer: Self-pay

## 2023-05-03 ENCOUNTER — Ambulatory Visit
Admission: RE | Admit: 2023-05-03 | Discharge: 2023-05-03 | Disposition: A | Discharge status: Home / Self Care | Source: Ambulatory Visit | Attending: Radiation Oncology | Admitting: Radiation Oncology

## 2023-05-03 ENCOUNTER — Other Ambulatory Visit: Payer: Self-pay | Admitting: Radiation Oncology

## 2023-05-03 DIAGNOSIS — Z510 Encounter for antineoplastic radiation therapy: Secondary | ICD-10-CM | POA: Diagnosis not present

## 2023-05-03 LAB — RAD ONC ARIA SESSION SUMMARY
Course Elapsed Days: 14
Plan Fractions Treated to Date: 9
Plan Prescribed Dose Per Fraction: 2.5 Gy
Plan Total Fractions Prescribed: 14
Plan Total Prescribed Dose: 35 Gy
Reference Point Dosage Given to Date: 25 Gy
Reference Point Session Dosage Given: 2.5 Gy
Session Number: 10

## 2023-05-03 MED ORDER — HYDROCODONE BIT-HOMATROP MBR 5-1.5 MG/5ML PO SOLN: 5.0000 mL | Freq: Four times a day (QID) | ORAL | 0 refills | Status: DC | PRN

## 2023-05-04 ENCOUNTER — Ambulatory Visit

## 2023-05-05 ENCOUNTER — Ambulatory Visit
Admission: RE | Admit: 2023-05-05 | Discharge: 2023-05-05 | Disposition: A | Discharge status: Home / Self Care | Source: Ambulatory Visit | Attending: Radiation Oncology | Admitting: Radiation Oncology

## 2023-05-05 ENCOUNTER — Other Ambulatory Visit: Payer: Self-pay

## 2023-05-05 DIAGNOSIS — Z510 Encounter for antineoplastic radiation therapy: Secondary | ICD-10-CM | POA: Diagnosis not present

## 2023-05-05 LAB — RAD ONC ARIA SESSION SUMMARY
Course Elapsed Days: 16
Plan Fractions Treated to Date: 10
Plan Prescribed Dose Per Fraction: 2.5 Gy
Plan Total Fractions Prescribed: 14
Plan Total Prescribed Dose: 35 Gy
Reference Point Dosage Given to Date: 27.5 Gy
Reference Point Session Dosage Given: 2.5 Gy
Session Number: 11

## 2023-05-06 ENCOUNTER — Other Ambulatory Visit: Payer: Self-pay

## 2023-05-06 ENCOUNTER — Ambulatory Visit
Admission: RE | Admit: 2023-05-06 | Discharge: 2023-05-06 | Disposition: A | Discharge status: Home / Self Care | Source: Ambulatory Visit | Attending: Radiation Oncology | Admitting: Radiation Oncology

## 2023-05-06 DIAGNOSIS — Z510 Encounter for antineoplastic radiation therapy: Secondary | ICD-10-CM | POA: Diagnosis not present

## 2023-05-06 LAB — RAD ONC ARIA SESSION SUMMARY
Course Elapsed Days: 17
Plan Fractions Treated to Date: 11
Plan Prescribed Dose Per Fraction: 2.5 Gy
Plan Total Fractions Prescribed: 14
Plan Total Prescribed Dose: 35 Gy
Reference Point Dosage Given to Date: 30 Gy
Reference Point Session Dosage Given: 2.5 Gy
Session Number: 12

## 2023-05-09 ENCOUNTER — Other Ambulatory Visit: Payer: Self-pay

## 2023-05-09 ENCOUNTER — Other Ambulatory Visit: Payer: Self-pay | Admitting: Radiology

## 2023-05-09 ENCOUNTER — Other Ambulatory Visit: Payer: Self-pay | Admitting: Radiation Oncology

## 2023-05-09 ENCOUNTER — Ambulatory Visit
Admission: RE | Admit: 2023-05-09 | Discharge: 2023-05-09 | Disposition: A | Discharge status: Home / Self Care | Source: Ambulatory Visit | Attending: Radiation Oncology | Admitting: Radiation Oncology

## 2023-05-09 DIAGNOSIS — K769 Liver disease, unspecified: Secondary | ICD-10-CM

## 2023-05-09 DIAGNOSIS — Z510 Encounter for antineoplastic radiation therapy: Secondary | ICD-10-CM | POA: Diagnosis not present

## 2023-05-09 LAB — RAD ONC ARIA SESSION SUMMARY
Course Elapsed Days: 20
Plan Fractions Treated to Date: 12
Plan Prescribed Dose Per Fraction: 2.5 Gy
Plan Total Fractions Prescribed: 14
Plan Total Prescribed Dose: 35 Gy
Reference Point Dosage Given to Date: 32.5 Gy
Reference Point Session Dosage Given: 2.5 Gy
Session Number: 13

## 2023-05-09 MED ORDER — HYDROCODONE BIT-HOMATROP MBR 5-1.5 MG/5ML PO SOLN: 5.0000 mL | Freq: Four times a day (QID) | ORAL | 0 refills | Status: DC | PRN

## 2023-05-10 ENCOUNTER — Ambulatory Visit: Admitting: Radiation Oncology

## 2023-05-10 ENCOUNTER — Other Ambulatory Visit: Payer: Self-pay

## 2023-05-10 ENCOUNTER — Ambulatory Visit (HOSPITAL_COMMUNITY)
Admission: RE | Admit: 2023-05-10 | Discharge: 2023-05-10 | Disposition: A | Discharge status: Home / Self Care | Source: Ambulatory Visit | Attending: Oncology | Admitting: Oncology

## 2023-05-10 ENCOUNTER — Ambulatory Visit

## 2023-05-10 ENCOUNTER — Encounter (HOSPITAL_COMMUNITY): Payer: Self-pay

## 2023-05-10 DIAGNOSIS — D649 Anemia, unspecified: Secondary | ICD-10-CM | POA: Insufficient documentation

## 2023-05-10 DIAGNOSIS — K219 Gastro-esophageal reflux disease without esophagitis: Secondary | ICD-10-CM | POA: Diagnosis not present

## 2023-05-10 DIAGNOSIS — C3401 Malignant neoplasm of right main bronchus: Secondary | ICD-10-CM | POA: Diagnosis not present

## 2023-05-10 DIAGNOSIS — I10 Essential (primary) hypertension: Secondary | ICD-10-CM | POA: Insufficient documentation

## 2023-05-10 DIAGNOSIS — C3411 Malignant neoplasm of upper lobe, right bronchus or lung: Secondary | ICD-10-CM | POA: Diagnosis present

## 2023-05-10 DIAGNOSIS — C787 Secondary malignant neoplasm of liver and intrahepatic bile duct: Secondary | ICD-10-CM | POA: Insufficient documentation

## 2023-05-10 DIAGNOSIS — K769 Liver disease, unspecified: Secondary | ICD-10-CM

## 2023-05-10 LAB — PROTIME-INR
INR: 1.2 (ref 0.8–1.2)
Prothrombin Time: 15.8 s — ABNORMAL HIGH (ref 11.4–15.2)

## 2023-05-10 LAB — CBC
HCT: 31.7 % — ABNORMAL LOW (ref 36.0–46.0)
Hemoglobin: 10.9 g/dL — ABNORMAL LOW (ref 12.0–15.0)
MCH: 31.6 pg (ref 26.0–34.0)
MCHC: 34.4 g/dL (ref 30.0–36.0)
MCV: 91.9 fL (ref 80.0–100.0)
Platelets: 443 10*3/uL — ABNORMAL HIGH (ref 150–400)
RBC: 3.45 MIL/uL — ABNORMAL LOW (ref 3.87–5.11)
RDW: 12.3 % (ref 11.5–15.5)
WBC: 11.4 10*3/uL — ABNORMAL HIGH (ref 4.0–10.5)
nRBC: 0 % (ref 0.0–0.2)

## 2023-05-10 MED ORDER — FENTANYL CITRATE (PF) 100 MCG/2ML IJ SOLN
INTRAMUSCULAR | Status: AC | PRN
Start: 2023-05-10 — End: 2023-05-10
  Administered 2023-05-10 (×2): 50 ug via INTRAVENOUS

## 2023-05-10 MED ORDER — SODIUM CHLORIDE 0.9 % IV SOLN: INTRAVENOUS | Status: DC

## 2023-05-10 MED ORDER — FENTANYL CITRATE (PF) 100 MCG/2ML IJ SOLN
INTRAMUSCULAR | Status: AC
  Filled 2023-05-10: qty 4

## 2023-05-10 MED ORDER — LIDOCAINE HCL (PF) 1 % IJ SOLN
8.0000 mL | Freq: Once | INTRAMUSCULAR | Status: AC
  Administered 2023-05-10: 8 mL via INTRADERMAL

## 2023-05-10 MED ORDER — MIDAZOLAM HCL 2 MG/2ML IJ SOLN
INTRAMUSCULAR | Status: AC | PRN
  Administered 2023-05-10 (×3): 1 mg via INTRAVENOUS

## 2023-05-10 MED ORDER — GELATIN ABSORBABLE 12-7 MM EX MISC
1.0000 | Freq: Once | CUTANEOUS | Status: AC
  Administered 2023-05-10: 1 via TOPICAL

## 2023-05-10 MED ORDER — MIDAZOLAM HCL 2 MG/2ML IJ SOLN
INTRAMUSCULAR | Status: AC
Start: 2023-05-10 — End: ?
  Filled 2023-05-10: qty 4

## 2023-05-10 NOTE — Procedures (Signed)
Interventional Radiology Procedure Note  Procedure: US Guided Biopsy of liver lesion  Complications: None  Estimated Blood Loss: < 10 mL  Findings: 18 G core biopsy of right lobe liver lesion performed under US guidance.  Four core samples obtained and sent to Pathology.  Jodi Marble. Fredia Sorrow, M.D Pager:  [phone]

## 2023-05-10 NOTE — Progress Notes (Signed)
Patient was given discharge instructions. She verbalized understanding.

## 2023-05-10 NOTE — H&P (Signed)
Chief Complaint: squamous cell lung cancer, liver lesion - image guided liver biopsy   Referring Provider(s): Melvyn Neth, Dequincy  Supervising Physician: Irish Lack  Patient Status: [facility] - Out-pt  History of Present Illness: Susan Franco is a 63 y.o. female with a history of GERD, HTN, anemia, and smoking.  She was recently diagnosed with stage IVB squamous cell lung cancer with disease spread to the liver.  She has been undergoing palliative radiation due to hemoptysis.  Interventional radiology was consulted for image guided liver biopsy.    Patient is Full Code  Past Medical History:  Diagnosis Date   Anemia    Anxiety    Depression    GERD (gastroesophageal reflux disease)    History of EKG 08/2011   was reflux-done at [facility] hospital-will request   Hypertension    atenolol and norvasc   Lung cancer (HCC)    Psoriasis    Psoriatic arthritis (HCC) 2020   [facility]   Rheumatoid arthritis (HCC) 2017   Seasonal allergies    Smoker    SOB (shortness of breath)     Past Surgical History:  Procedure Laterality Date   ABDOMINAL HYSTERECTOMY     Laparoscopic total hysterectomy for fibroids and bilateral salpingenctomy   ANKLE SURGERY Left    X 3   COLONOSCOPY W/ POLYPECTOMY     CYSTOSCOPY  11/30/2011   Procedure: CYSTOSCOPY;  Surgeon: Alison Murray, MD;  Location: [facility];  Service: Gynecology;  Laterality: N/A;   DIAGNOSTIC LAPAROSCOPY  03/30/1983   ectopic   DILATION AND CURETTAGE OF UTERUS     TRANSFORAMINAL LUMBAR INTERBODY FUSION W/ MIS 2 LEVEL N/A 09/11/2020   Procedure: Minimally invasive fusion Lumbar four-five, Lumbar five-Sacral one;  Surgeon: Bedelia Person, MD;  Location: [facility];  Service: Neurosurgery;  Laterality: N/A;   TUBAL LIGATION     WISDOM TOOTH EXTRACTION      Allergies: Cosentyx [secukinumab]  Medications: Prior to Admission medications   Medication Sig Start Date End Date Taking? Authorizing Provider  ALPRAZolam (XANAX) 0.25 MG  tablet Take 0.25 mg by mouth 2 (two) times daily as needed for anxiety.    [provider]  amLODipine (NORVASC) 5 MG tablet Take 5 mg by mouth daily.    [provider]  atenolol (TENORMIN) 50 MG tablet Take 50 mg by mouth daily.    [provider]  HYDROcodone bit-homatropine (HYCODAN) 5-1.5 MG/5ML syrup Take 5 mLs by mouth every 6 (six) hours as needed for cough. 05/09/23   Lance Bosch, MD  lisinopril (PRINIVIL,ZESTRIL) 20 MG tablet Take 20 mg by mouth daily. 01/20/17   [provider]  loperamide (IMODIUM A-D) 2 MG tablet Take 2 mg by mouth 4 (four) times daily as needed for diarrhea or loose stools.    [provider]  omeprazole (PRILOSEC) 40 MG capsule Take 40 mg by mouth daily.    [provider]  oxyCODONE (OXY IR/ROXICODONE) 5 MG immediate release tablet Take 1 tablet (5 mg total) by mouth every 3 (three) hours as needed for moderate pain ((score 4 to 6)). 09/13/20   Jadene Pierini, MD  spironolactone (ALDACTONE) 25 MG tablet Take 25 mg by mouth daily.    [provider]  Tofacitinib Citrate ER (XELJANZ XR) 11 MG TB24 Take 11 mg by mouth daily. Okay to restart 09/26/20 09/13/20   Jadene Pierini, MD  venlafaxine XR (EFFEXOR XR) 75 MG 24 hr capsule Take 1 capsule (75  mg total) by mouth daily with breakfast. 01/21/21   Patton Salles, MD     Family History  Problem Relation Age of Onset   Osteoporosis Mother    Cancer Mother        renal cell, brain   CAD Father    CAD Paternal Uncle    Parkinson's disease Maternal Grandfather    Cervical cancer Paternal Grandmother    CAD Paternal Grandfather     Social History   Socioeconomic History   Marital status: Widowed    Spouse name: Not on file   Number of children: 2   Years of education: 11 + 2   Highest education level: Not on file  Occupational History   Occupation: RETIRED - CLERICAL  Tobacco Use   Smoking status: Former    Current packs/day:  0.00    Average packs/day: 1 pack/day for 39.0 years (39.0 ttl pk-yrs)    Types: Cigarettes    Start date: 68    Quit date: 2022    Years since quitting: 3.1   Smokeless tobacco: Never  Vaping Use   Vaping status: Never Used  Substance and Sexual Activity   Alcohol use: Yes    Alcohol/week: 2.0 standard drinks of alcohol    Types: 2 Glasses of wine per week    Comment: occas   Drug use: No   Sexual activity: Not Currently    Partners: Male    Birth control/protection: Surgical    Comment: Tubal/R-TLH  Other Topics Concern   Not on file  Social History Narrative   Not on file   Social Drivers of Health   Financial Resource Strain: Not on file  Food Insecurity: No Food Insecurity (04/05/2023)   Hunger Vital Sign    Worried About Running Out of Food in the Last Year: Never true    Ran Out of Food in the Last Year: Never true  Transportation Needs: No Transportation Needs (04/05/2023)   PRAPARE - Administrator, Civil Service (Medical): No    Lack of Transportation (Non-Medical): No  Physical Activity: Not on file  Stress: Not on file  Social Connections: Not on file     Review of Systems: A 12 point ROS discussed and pertinent positives are indicated in the HPI above.  All other systems are negative.  Review of Systems  Constitutional:  Negative for chills, fatigue and fever.  Respiratory:  Negative for cough, shortness of breath and wheezing.   Gastrointestinal:  Negative for diarrhea, nausea and vomiting.  Neurological:  Negative for dizziness and headaches.  Psychiatric/Behavioral:  Negative for agitation, behavioral problems and confusion.     Vital Signs: LMP 11/15/2011   Advance Care Plan: The advanced care place/surrogate decision maker was discussed at the time of visit and the patient did not wish to discuss or was not able to name a surrogate decision maker or provide an advance care plan.  Physical Exam Constitutional:      Appearance: She is  well-developed.  HENT:     Head: Atraumatic.     Mouth/Throat:     Mouth: Mucous membranes are moist.  Cardiovascular:     Rate and Rhythm: Normal rate and regular rhythm.     Heart sounds: No murmur heard. Pulmonary:     Effort: Pulmonary effort is normal.     Breath sounds: Normal breath sounds.  Abdominal:     General: Bowel sounds are normal.     Palpations: Abdomen is  soft.  Musculoskeletal:        General: Normal range of motion.  Skin:    General: Skin is warm.  Neurological:     Mental Status: She is alert and oriented to person, place, and time.  Psychiatric:        Mood and Affect: Mood normal.        Behavior: Behavior normal.     Imaging: No results found.  Labs:  CBC: Recent Labs    04/05/23 1334  WBC 6.8  HGB 12.0  HCT 34.2*  PLT 293    COAGS: No results for input(s): "INR", "APTT" in the last 8760 hours.  BMP: Recent Labs    04/05/23 1334  NA 129*  K 4.7  CL 93*  CO2 24  GLUCOSE 148*  BUN 7*  CALCIUM 10.4*  CREATININE 0.70  GFRNONAA >60    LIVER FUNCTION TESTS: Recent Labs    04/05/23 1334  BILITOT 0.3  AST 35  ALT 27  ALKPHOS 107  PROT 7.7  ALBUMIN 4.4    TUMOR MARKERS: No results for input(s): "AFPTM", "CEA", "CA199", "CHROMGRNA" in the last 8760 hours.  Assessment and Plan:  Pt has squamous cell lung cancer that appears to have spread to the liver - interventional radiology will perform image guided liver biopsy 05/10/2023.    Risks and benefits of image guided liver biopsy was discussed with the patient and/or patient's family including, but not limited to bleeding, infection, damage to adjacent structures or low yield requiring additional tests.  All of the questions were answered and there is agreement to proceed.  Consent signed and in chart.   Thank you for allowing our service to participate in Susan Franco 's care.  Electronically Signed: Loman Brooklyn, PA-C   05/10/2023, 8:09 AM    I spent a  total of  30 Minutes   in face to face in clinical consultation, greater than 50% of which was counseling/coordinating care for image guided liver biopsy.

## 2023-05-11 ENCOUNTER — Ambulatory Visit
Admission: RE | Admit: 2023-05-11 | Discharge: 2023-05-11 | Disposition: A | Discharge status: Home / Self Care | Source: Ambulatory Visit | Attending: Radiation Oncology

## 2023-05-11 ENCOUNTER — Ambulatory Visit

## 2023-05-11 ENCOUNTER — Other Ambulatory Visit: Payer: Self-pay

## 2023-05-11 DIAGNOSIS — Z510 Encounter for antineoplastic radiation therapy: Secondary | ICD-10-CM | POA: Diagnosis not present

## 2023-05-11 LAB — RAD ONC ARIA SESSION SUMMARY
Course Elapsed Days: 22
Plan Fractions Treated to Date: 13
Plan Prescribed Dose Per Fraction: 2.5 Gy
Plan Total Fractions Prescribed: 14
Plan Total Prescribed Dose: 35 Gy
Reference Point Dosage Given to Date: 35 Gy
Reference Point Session Dosage Given: 2.5 Gy
Session Number: 14

## 2023-05-12 ENCOUNTER — Ambulatory Visit
Admission: RE | Admit: 2023-05-12 | Discharge: 2023-05-12 | Disposition: A | Discharge status: Home / Self Care | Source: Ambulatory Visit | Attending: Radiation Oncology | Admitting: Radiation Oncology

## 2023-05-12 ENCOUNTER — Other Ambulatory Visit: Payer: Self-pay

## 2023-05-12 DIAGNOSIS — Z510 Encounter for antineoplastic radiation therapy: Secondary | ICD-10-CM | POA: Diagnosis not present

## 2023-05-12 LAB — RAD ONC ARIA SESSION SUMMARY
Course Elapsed Days: 23
Plan Fractions Treated to Date: 14
Plan Prescribed Dose Per Fraction: 2.5 Gy
Plan Total Fractions Prescribed: 14
Plan Total Prescribed Dose: 35 Gy
Reference Point Dosage Given to Date: 37.5 Gy
Reference Point Session Dosage Given: 2.5 Gy
Session Number: 15

## 2023-05-12 LAB — SURGICAL PATHOLOGY

## 2023-05-13 NOTE — Radiation Completion Notes (Signed)
Patient Name: Susan Franco, Susan Franco MRN: [id number] Date of Birth: 09/18/1960 Referring Physician: Cheri Rous, M.D. Date of Service: 2023-05-13 Radiation Oncologist: Lance Bosch, M.D. [facility]                             RADIATION ONCOLOGY END OF TREATMENT NOTE     Diagnosis: C34.31 Malignant neoplasm of lower lobe, right bronchus or lung Staging on 2023-04-26: Cancer of hilus of right lung (HCC) T=cT2a, N=cN2a, M=cM1c1 Intent: Palliative     ==========DELIVERED PLANS==========  First Treatment Date: 2023-04-19 Last Treatment Date: 2023-05-12   Plan Name: Lung_R Site: Lung, Right Technique: 3D Mode: Photon Dose Per Fraction: 2.5 Gy Prescribed Dose (Delivered / Prescribed): 2.5 Gy / 2.5 Gy Prescribed Fxs (Delivered / Prescribed): 1 / 1   Plan Name: Lung_R:1 Site: Lung, Right Technique: 3D Mode: Photon Dose Per Fraction: 2.5 Gy Prescribed Dose (Delivered / Prescribed): 35 Gy / 35 Gy Prescribed Fxs (Delivered / Prescribed): 14 / 14     ==========ON TREATMENT VISIT DATES========== 2023-04-19, 2023-04-26, 2023-05-03, 2023-05-09     ==========UPCOMING VISITS========== 05/17/2023 [facility] ONC LAB [facility]  05/17/2023 [facility] ONC EST PT 15 Lewis, Dequincy A, MD        ==========APPENDIX - ON TREATMENT VISIT NOTES==========   See weekly On Treatment Notes in Epic for details in the Media tab (listed as Progress notes on the On Treatment Visit Dates listed above).

## 2023-05-16 LAB — GUARDANT 360

## 2023-05-16 NOTE — Progress Notes (Signed)
[facility]  [address] [place],  [place]  [address] [phone]  Clinic Day:  05/17/2023  Referring physician: Nonnie Done., MD  HISTORY OF PRESENT ILLNESS:  The patient is a 63 y.o. female with stage IVB (T2a N2a M1c1) squamous cell lung cancer, which includes spread of disease to her liver.  She comes in today to review her liver biopsy results and their implications.  She did undergo Guardant testing of her biopsied lung tissue, for which no mutations/alterations were seen.  She completed palliative radiation to her lung mass, which was given due to it causing hemoptysis.  Her hemoptysis has virtually dissipated after her radiation; however, she has a chronic cough, which can last throughout the entire day.  She denies having other symptoms which concern her for overt signs of disease progression.    PHYSICAL EXAM:  Blood pressure 124/87, pulse 87, temperature 98.3 F (36.8 C), temperature source Oral, resp. rate 18, height 6' (1.829 m), weight 259 lb 8 oz (117.7 kg), last menstrual period 11/15/2011, SpO2 95%. Wt Readings from Last 3 Encounters:  05/17/23 259 lb 8 oz (117.7 kg)  05/10/23 262 lb (118.8 kg)  04/26/23 263 lb 11.2 oz (119.6 kg)   Body mass index is 35.19 kg/m. Performance status (ECOG): 1 - Symptomatic but completely ambulatory Physical Exam Constitutional:      Appearance: Normal appearance. She is obese. She is not ill-appearing.     Comments: She is in a wheelchair  HENT:     Mouth/Throat:     Mouth: Mucous membranes are moist.     Pharynx: Oropharynx is clear. No oropharyngeal exudate or posterior oropharyngeal erythema.  Cardiovascular:     Rate and Rhythm: Normal rate and regular rhythm.     Heart sounds: No murmur heard.    No friction rub. No gallop.  Pulmonary:     Effort: Pulmonary effort is normal. No respiratory distress.     Breath sounds: Normal breath sounds. No wheezing, rhonchi or rales.  Abdominal:      General: Bowel sounds are normal. There is no distension.     Palpations: Abdomen is soft. There is no mass.     Tenderness: There is no abdominal tenderness.  Musculoskeletal:        General: No swelling.     Right lower leg: No edema.     Left lower leg: No edema.  Lymphadenopathy:     Cervical: No cervical adenopathy.     Upper Body:     Right upper body: No supraclavicular or axillary adenopathy.     Left upper body: No supraclavicular or axillary adenopathy.     Lower Body: No right inguinal adenopathy. No left inguinal adenopathy.  Skin:    General: Skin is warm.     Coloration: Skin is not jaundiced.     Findings: No lesion or rash.  Neurological:     General: No focal deficit present.     Mental Status: She is alert and oriented to person, place, and time. Mental status is at baseline.  Psychiatric:        Mood and Affect: Mood normal.        Behavior: Behavior normal.        Thought Content: Thought content normal.    PATHOLOGIC RESULTS:   LIVER, RIGHT LOBE, LESION, NEEDLE CORE BIOPSY: Metastatic poorly differentiated carcinoma with extensive necrosis (see comment)  COMMENT:  Sections show cores of hepatic parenchyma infiltrated by a tumor  within desmoplastic and fibrotic stroma composed of solid sheets and nests of large atypical cells with marked nuclear cytoplasmic ratio and a variably enlarged round to oval nucleus with scattered conspicuous nucleoli.  The tumor shows frequent apoptotic bodies and geographic tumor necrosis.  No keratinization is seen. Eight immunohistochemical stains are performed with adequate control.  The tumor is diffusely and strongly positive for the squamous marker p40.  They tumor also shows positivity for CD56 and TTF-1.  The tumor is negative for cytokeratin 7 and the pulmonary adeno marker Napsin A.  The tumor is also negative for the neuroendocrine marker synaptophysin.  The tumor is negative for cytokeratin 20.  The  proliferation marker Ki-67 decorates up to 70% of the tumor. The strong staining for p40 would tend to favor this being a poorly differentiated squamous carcinoma; however, the tumor shows cytohistomorphology and a proliferation rate more often seen in a neuroendocrine carcinoma.  The tumor also shows positivity for the neuroendocrine markers CD56 and TTF-1 suggesting at least focal neuroendocrine differentiation.  Clinical and radiologic correlation is recommended.   LABS:      Latest Ref Rng & Units 05/17/2023    1:46 PM 05/10/2023   11:36 AM 04/05/2023    1:34 PM  CBC  WBC 4.0 - 10.5 K/uL 7.9  11.4  6.8   Hemoglobin 12.0 - 15.0 g/dL 82.9  56.2  13.0   Hematocrit 36.0 - 46.0 % 31.5  31.7  34.2   Platelets 150 - 400 K/uL 474  443  293       Latest Ref Rng & Units 05/17/2023    1:46 PM 04/05/2023    1:34 PM 09/11/2020    5:09 PM  CMP  Glucose 70 - 99 mg/dL 865  784    BUN 8 - 23 mg/dL 7  7    Creatinine 6.96 - 1.00 mg/dL 2.95  2.84    Sodium 132 - 145 mmol/L 133  129  136   Potassium 3.5 - 5.1 mmol/L 4.5  4.7  3.7   Chloride 98 - 111 mmol/L 95  93    CO2 22 - 32 mmol/L 25  24    Calcium 8.9 - 10.3 mg/dL 44.0  10.2    Total Protein 6.5 - 8.1 g/dL 7.8  7.7    Total Bilirubin 0.0 - 1.2 mg/dL 0.2  0.3    Alkaline Phos 38 - 126 U/L 125  107    AST 15 - 41 U/L 21  35    ALT 0 - 44 U/L 18  27      ASSESSMENT & PLAN:  A 63 y.o. female who clinically appears to have stage IVB squamous cell lung cancer.  In clinic today, I went over her liver biopsy results with her, which ultimately confirm that it is her lung cancer that has spread to her liver.  As mentioned previously, Guardant test results unfortunately showed that her cancer does not possess any mutations/alterations to where targeted therapy can be considered.  This will essentially relegate her to standard chemotherapy.  Of note, this patient has severe psoriatic arthritis, which prevents me from giving her immunotherapy.  Based  upon this, we will start this patient on carboplatin/paclitaxel.  Carboplatin will be given at the AUC of 6; Paclitaxel be given at 200 mg/m.  The patient was made aware of the side effects which go along with his chemotherapy regimen, including alopecia, nausea, peripheral neuropathy, cytopenias, and fatigue.  I will arrange for her  to have a port placed through which all of her IV chemotherapy will be given.  The plan is for her to receive 4-6 cycles of treatment.  The exact number cycles will depend on how well she tolerates this regimen.  I anticipate her first cycle of carboplatin/paclitaxel to be given on Thursday, February 27th.  I will see her back 3 weeks later before she heads into her second cycle of carboplatin/paclitaxel.  The patient understands all the plans discussed today and is in agreement with them.  Liisa Picone Kirby Funk, MD

## 2023-05-16 NOTE — Progress Notes (Incomplete)
[facility]  [address] [place],  [place]  [address] [phone]  Clinic Day:  05/16/2023  Referring physician: Nonnie Done., MD  HISTORY OF PRESENT ILLNESS:  The patient is a 63 y.o. female with stage IVB (T2a N2a M1c1) squamous cell lung cancer, which includes spread of disease to her liver.  She comes in today to review her liver biopsy results and their implcations.  She did undergo Guardant testing of her biopsied lung tissue, for which no mutations/alterations were seen.  She completed  palliative radiation to her lung mass due to it causing hemoptysis.  Although not completely gone, her hemoptysis has improved.  She has thus far completed 4 of 14 planned radiation treatments.  This patient has yet to undergo her liver biopsy to definitively prove metastatic squamous cell cancer to her liver.  She denies having other symptoms which concern her for overt signs of disease progression.    PHYSICAL EXAM:  Last menstrual period 11/15/2011. Wt Readings from Last 3 Encounters:  05/10/23 262 lb (118.8 kg)  04/26/23 263 lb 11.2 oz (119.6 kg)  04/07/23 266 lb 8 oz (120.9 kg)   There is no height or weight on file to calculate BMI. Performance status (ECOG): 1 - Symptomatic but completely ambulatory Physical Exam Constitutional:      Appearance: Normal appearance. She is obese. She is not ill-appearing.     Comments: She is in a wheelchair  HENT:     Mouth/Throat:     Mouth: Mucous membranes are moist.     Pharynx: Oropharynx is clear. No oropharyngeal exudate or posterior oropharyngeal erythema.  Cardiovascular:     Rate and Rhythm: Normal rate and regular rhythm.     Heart sounds: No murmur heard.    No friction rub. No gallop.  Pulmonary:     Effort: Pulmonary effort is normal. No respiratory distress.     Breath sounds: Normal breath sounds. No wheezing, rhonchi or rales.  Abdominal:     General: Bowel sounds are normal. There is no  distension.     Palpations: Abdomen is soft. There is no mass.     Tenderness: There is no abdominal tenderness.  Musculoskeletal:        General: No swelling.     Right lower leg: No edema.     Left lower leg: No edema.  Lymphadenopathy:     Cervical: No cervical adenopathy.     Upper Body:     Right upper body: No supraclavicular or axillary adenopathy.     Left upper body: No supraclavicular or axillary adenopathy.     Lower Body: No right inguinal adenopathy. No left inguinal adenopathy.  Skin:    General: Skin is warm.     Coloration: Skin is not jaundiced.     Findings: No lesion or rash.  Neurological:     General: No focal deficit present.     Mental Status: She is alert and oriented to person, place, and time. Mental status is at baseline.  Psychiatric:        Mood and Affect: Mood normal.        Behavior: Behavior normal.        Thought Content: Thought content normal.    PATHOLOGIC RESULTS:   LIVER, RIGHT LOBE, LESION, NEEDLE CORE BIOPSY: Metastatic poorly differentiated carcinoma with extensive necrosis (see comment)  COMMENT:  Sections show cores of hepatic parenchyma infiltrated by a tumor within desmoplastic and fibrotic stroma composed of  solid sheets and nests of large atypical cells with marked nuclear cytoplasmic ratio and a variably enlarged round to oval nucleus with scattered conspicuous nucleoli.  The tumor shows frequent apoptotic bodies and geographic tumor necrosis.  No keratinization is seen. Eight immunohistochemical stains are performed with adequate control.  The tumor is diffusely and strongly positive for the squamous marker p40.  They tumor also shows positivity for CD56 and TTF-1.  The tumor is negative for cytokeratin 7 and the pulmonary adeno marker Napsin A.  The tumor is also negative for the neuroendocrine marker synaptophysin.  The tumor is negative for cytokeratin 20.  The proliferation marker Ki-67 decorates up to 70% of the  tumor. The strong staining for p40 would tend to favor this being a poorly differentiated squamous carcinoma; however, the tumor shows cytohistomorphology and a proliferation rate more often seen in a neuroendocrine carcinoma.  The tumor also shows positivity for the neuroendocrine markers CD56 and TTF-1 suggesting at least focal neuroendocrine differentiation.  Clinical and radiologic correlation is recommended.   LABS:      Latest Ref Rng & Units 05/10/2023   11:36 AM 04/05/2023    1:34 PM 09/11/2020    5:09 PM  CBC  WBC 4.0 - 10.5 K/uL 11.4  6.8    Hemoglobin 12.0 - 15.0 g/dL 45.4  09.8  11.9   Hematocrit 36.0 - 46.0 % 31.7  34.2  33.0   Platelets 150 - 400 K/uL 443  293        Latest Ref Rng & Units 04/05/2023    1:34 PM 09/11/2020    5:09 PM 09/11/2020    2:27 PM  CMP  Glucose 70 - 99 mg/dL 147     BUN 8 - 23 mg/dL 7     Creatinine 8.29 - 1.00 mg/dL 5.62     Sodium 130 - 865 mmol/L 129  136  135   Potassium 3.5 - 5.1 mmol/L 4.7  3.7  3.5   Chloride 98 - 111 mmol/L 93     CO2 22 - 32 mmol/L 24     Calcium 8.9 - 10.3 mg/dL 78.4     Total Protein 6.5 - 8.1 g/dL 7.7     Total Bilirubin 0.0 - 1.2 mg/dL 0.3     Alkaline Phos 38 - 126 U/L 107     AST 15 - 41 U/L 35     ALT 0 - 44 U/L 27       ASSESSMENT & PLAN:  A 63 y.o. female who clinically appears to have stage IVB squamous cell lung cancer.  In clinic today, I went over her Guardant test results with her, which unfortunately showed that her cancer does not possess any mutations to where targeted therapy can be considered.  This will essentially relegate her to standard chemotherapy.  Of note, this patient has severe psoriatic arthritis, which serves as an impediment for her to receive some form of immunotherapy.  This essentially relegates her to some form of platinum-based chemotherapy regimen.  For now, the patient will complete her palliative radiation to hopefully completely eradicate her hemoptysis.  Her liver biopsy is  also scheduled in 2 weeks.  I will see this patient back in 3 weeks to go over her biopsy results and discuss what her palliative treatment plan will be. The patient understands all the plans discussed today and is in agreement with them.  Mikaela Hilgeman Kirby Funk, MD

## 2023-05-17 ENCOUNTER — Inpatient Hospital Stay

## 2023-05-17 ENCOUNTER — Other Ambulatory Visit: Payer: Self-pay | Admitting: Oncology

## 2023-05-17 ENCOUNTER — Inpatient Hospital Stay: Attending: Oncology | Admitting: Oncology

## 2023-05-17 VITALS — BP 124/87 | HR 87 | Temp 98.3°F | Resp 18 | Ht 72.0 in | Wt 259.5 lb

## 2023-05-17 DIAGNOSIS — C3490 Malignant neoplasm of unspecified part of unspecified bronchus or lung: Secondary | ICD-10-CM

## 2023-05-17 DIAGNOSIS — G629 Polyneuropathy, unspecified: Secondary | ICD-10-CM | POA: Diagnosis not present

## 2023-05-17 DIAGNOSIS — C787 Secondary malignant neoplasm of liver and intrahepatic bile duct: Secondary | ICD-10-CM | POA: Insufficient documentation

## 2023-05-17 DIAGNOSIS — C3401 Malignant neoplasm of right main bronchus: Secondary | ICD-10-CM | POA: Insufficient documentation

## 2023-05-17 DIAGNOSIS — Z5111 Encounter for antineoplastic chemotherapy: Secondary | ICD-10-CM | POA: Insufficient documentation

## 2023-05-17 DIAGNOSIS — Z5189 Encounter for other specified aftercare: Secondary | ICD-10-CM | POA: Diagnosis not present

## 2023-05-17 LAB — CMP (CANCER CENTER ONLY)
ALT: 18 U/L (ref 0–44)
AST: 21 U/L (ref 15–41)
Albumin: 3.6 g/dL (ref 3.5–5.0)
Alkaline Phosphatase: 125 U/L (ref 38–126)
Anion gap: 14 (ref 5–15)
BUN: 7 mg/dL — ABNORMAL LOW (ref 8–23)
CO2: 25 mmol/L (ref 22–32)
Calcium: 10.7 mg/dL — ABNORMAL HIGH (ref 8.9–10.3)
Chloride: 95 mmol/L — ABNORMAL LOW (ref 98–111)
Creatinine: 0.8 mg/dL (ref 0.44–1.00)
GFR, Estimated: >60 mL/min (ref >60)
Glucose, Bld: 107 mg/dL — ABNORMAL HIGH (ref 70–99)
Potassium: 4.5 mmol/L (ref 3.5–5.1)
Sodium: 133 mmol/L — ABNORMAL LOW (ref 135–145)
Total Bilirubin: 0.2 mg/dL (ref 0.0–1.2)
Total Protein: 7.8 g/dL (ref 6.5–8.1)

## 2023-05-17 LAB — CBC WITH DIFFERENTIAL (CANCER CENTER ONLY)
Abs Immature Granulocytes: 0.12 10*3/uL — ABNORMAL HIGH (ref 0.00–0.07)
Basophils Absolute: 0 10*3/uL (ref 0.0–0.1)
Basophils Relative: 1 %
Eosinophils Absolute: 0.1 10*3/uL (ref 0.0–0.5)
Eosinophils Relative: 1 %
HCT: 31.5 % — ABNORMAL LOW (ref 36.0–46.0)
Hemoglobin: 10.6 g/dL — ABNORMAL LOW (ref 12.0–15.0)
Immature Granulocytes: 2 %
Lymphocytes Relative: 3 %
Lymphs Abs: 0.2 10*3/uL — ABNORMAL LOW (ref 0.7–4.0)
MCH: 30.8 pg (ref 26.0–34.0)
MCHC: 33.7 g/dL (ref 30.0–36.0)
MCV: 91.6 fL (ref 80.0–100.0)
Monocytes Absolute: 0.9 10*3/uL (ref 0.1–1.0)
Monocytes Relative: 11 %
Neutro Abs: 6.6 10*3/uL (ref 1.7–7.7)
Neutrophils Relative %: 82 %
Platelet Count: 474 10*3/uL — ABNORMAL HIGH (ref 150–400)
RBC: 3.44 MIL/uL — ABNORMAL LOW (ref 3.87–5.11)
RDW: 12.3 % (ref 11.5–15.5)
WBC Count: 7.9 10*3/uL (ref 4.0–10.5)
nRBC: 0 % (ref 0.0–0.2)
nRBC: 0 /100{WBCs}

## 2023-05-17 MED ORDER — HYDROCODONE BIT-HOMATROP MBR 5-1.5 MG/5ML PO SOLN: 5.0000 mL | Freq: Four times a day (QID) | ORAL | 0 refills | Status: DC | PRN

## 2023-05-17 MED ORDER — PREDNISONE 10 MG PO TABS: ORAL_TABLET | ORAL | 0 refills | Status: DC

## 2023-05-17 NOTE — Progress Notes (Signed)
START ON PATHWAY REGIMEN - Non-Small Cell Lung     A cycle is every 21 days:     Paclitaxel      Carboplatin   **Always confirm dose/schedule in your pharmacy ordering system**  Patient Characteristics: Stage IV Metastatic, Squamous, Molecular Analysis Completed, Alteration Present and Targeted Therapy Exhausted or EGFR Exon 20 Insertion or KRAS G12C or HER2 Present, and No Prior Chemo/Immunotherapy or No Alteration Present, PS = 0, 1, Initial  Chemotherapy/Immunotherapy, No Alteration Present, Not a Candidate for Immunotherapy Therapeutic Status: Stage IV Metastatic Histology: Squamous Cell Molecular Analysis Results: No Alteration Present ECOG Performance Status: 1 Chemotherapy/Immunotherapy Line of Therapy: Initial Chemotherapy/Immunotherapy Immunotherapy Candidate Status: Not a Candidate for Immunotherapy Intent of Therapy: Non-Curative / Palliative Intent, Discussed with Patient

## 2023-05-18 ENCOUNTER — Encounter: Payer: Self-pay | Admitting: Oncology

## 2023-05-18 ENCOUNTER — Other Ambulatory Visit: Payer: Self-pay

## 2023-05-19 ENCOUNTER — Encounter: Payer: Self-pay | Admitting: Oncology

## 2023-05-20 ENCOUNTER — Encounter: Payer: Self-pay | Admitting: Oncology

## 2023-05-20 ENCOUNTER — Other Ambulatory Visit (HOSPITAL_BASED_OUTPATIENT_CLINIC_OR_DEPARTMENT_OTHER): Payer: Self-pay

## 2023-05-20 ENCOUNTER — Other Ambulatory Visit: Payer: Self-pay | Admitting: Oncology

## 2023-05-20 DIAGNOSIS — E876 Hypokalemia: Secondary | ICD-10-CM

## 2023-05-20 MED ORDER — HYDROCODONE BIT-HOMATROP MBR 5-1.5 MG/5ML PO SOLN
5.0000 mL | Freq: Four times a day (QID) | ORAL | 0 refills | Status: AC | PRN
  Filled 2023-05-20 (×2): qty 473, 24d supply, fill #0

## 2023-05-23 ENCOUNTER — Encounter: Payer: Self-pay | Admitting: Oncology

## 2023-05-23 ENCOUNTER — Inpatient Hospital Stay (HOSPITAL_BASED_OUTPATIENT_CLINIC_OR_DEPARTMENT_OTHER): Admitting: Nurse Practitioner

## 2023-05-23 ENCOUNTER — Encounter: Payer: Self-pay | Admitting: Nurse Practitioner

## 2023-05-23 VITALS — BP 134/86 | HR 77 | Temp 98.1°F | Resp 20 | Ht 72.0 in | Wt 258.8 lb

## 2023-05-23 DIAGNOSIS — C787 Secondary malignant neoplasm of liver and intrahepatic bile duct: Secondary | ICD-10-CM

## 2023-05-23 DIAGNOSIS — C3401 Malignant neoplasm of right main bronchus: Secondary | ICD-10-CM | POA: Diagnosis not present

## 2023-05-23 DIAGNOSIS — Z5111 Encounter for antineoplastic chemotherapy: Secondary | ICD-10-CM | POA: Diagnosis not present

## 2023-05-23 MED ORDER — PROCHLORPERAZINE MALEATE 10 MG PO TABS
10.0000 mg | ORAL_TABLET | Freq: Four times a day (QID) | ORAL | 1 refills | Status: AC | PRN
  Filled 2024-03-20: qty 30, 8d supply, fill #0

## 2023-05-23 MED ORDER — LORATADINE 10 MG PO TABS: 10.0000 mg | ORAL_TABLET | Freq: Every day | ORAL | 0 refills | Status: DC

## 2023-05-23 MED ORDER — DEXAMETHASONE 4 MG PO TABS: ORAL_TABLET | ORAL | 1 refills | Status: AC

## 2023-05-23 MED ORDER — OLANZAPINE 5 MG PO TABS: 5.0000 mg | ORAL_TABLET | Freq: Every day | ORAL | 0 refills | Status: AC

## 2023-05-23 MED ORDER — LIDOCAINE-PRILOCAINE 2.5-2.5 % EX CREA: TOPICAL_CREAM | CUTANEOUS | 3 refills | Status: AC

## 2023-05-23 MED ORDER — ONDANSETRON HCL 8 MG PO TABS: 8.0000 mg | ORAL_TABLET | Freq: Three times a day (TID) | ORAL | 1 refills | Status: AC | PRN

## 2023-05-23 NOTE — Progress Notes (Signed)
 [facility] CARE CLINIC CONSULT NOTE [facility] [address] [place], [place] [address] [phone] (phone) [phone] (fax)  Patient Care Team: Nonnie Done., MD as PCP - General (Family Medicine)   Name of the patient: Susan Franco  [id number]  Jul 26, 1960   Date of visit: 05/23/23  Diagnosis- Stage IVb Lung Cancer  Chief complaint/Reason for visit- Initial Meeting for [facility] preparing for starting chemotherapy  Heme/Onc history:  Oncology History  Cancer of hilus of right lung (HCC)  04/05/2023 Initial Diagnosis   Cancer of hilus of right lung (HCC)   04/26/2023 Cancer Staging   Staging form: Lung, AJCC V9 - Clinical stage from 04/26/2023: Stage IVB (cT2a, cN2a(f), cM1c1) - Signed by Weston Settle, MD on 04/26/2023 Histopathologic type: Squamous cell carcinoma, NOS Stage prefix: Initial diagnosis Method of lymph node assessment: Core biopsy Histologic grade (G): G3 Histologic grading system: 4 grade system   05/26/2023 -  Chemotherapy   Patient is on Treatment Plan : LUNG NSCLC Carboplatin + Paclitaxel q21d X 6 Cycles       Interval history-  Susan Franco is a 63 y.o. female who presents to chemo care clinic today for initial meeting in preparation for starting chemotherapy. I introduced the chemo care clinic and we discussed that the role of the clinic is to assist those who are at an increased risk of emergency room visits and/or complications during the course of chemotherapy treatment. We discussed that the increased risk takes into account factors such as age, performance status, and co-morbidities. We also discussed that for some, this might include barriers to care such as not having a primary care provider, lack of insurance/transportation, or not being able to afford medications. We discussed that the goal of the program is to help prevent unplanned ER visits and help reduce complications during chemotherapy. We do this by discussing  specific risk factors to each individual and identifying ways that we can help improve these risk factors and reduce barriers to care.   ECOG FS:2 - Symptomatic, <50% confined to bed  Review of systems- Review of Systems  Constitutional:  Positive for malaise/fatigue. Negative for chills, fever and weight loss.  HENT:  Negative for hearing loss, nosebleeds, sore throat and tinnitus.   Eyes:  Negative for blurred vision and double vision.  Respiratory:  Positive for cough, hemoptysis and sputum production. Negative for shortness of breath and wheezing.   Cardiovascular:  Negative for chest pain, palpitations and leg swelling.  Gastrointestinal:  Negative for abdominal pain, blood in stool, constipation, diarrhea, melena, nausea and vomiting.  Genitourinary:  Negative for dysuria and urgency.  Musculoskeletal:  Negative for back pain, falls, joint pain and myalgias.  Skin:  Negative for itching and rash.  Neurological:  Positive for sensory change (neuropathy of left foot). Negative for dizziness, tingling, loss of consciousness, weakness and headaches.  Endo/Heme/Allergies:  Negative for environmental allergies. Does not bruise/bleed easily.  Psychiatric/Behavioral:  Positive for depression. The patient is nervous/anxious and has insomnia.      Allergies  Allergen Reactions   Cosentyx [Secukinumab] Hives    Past Medical History:  Diagnosis Date   Anemia    Anxiety    Depression    GERD (gastroesophageal reflux disease)    History of EKG 08/2011   was reflux-done at [facility] hospital-will request   Hypertension    atenolol and norvasc   Lung cancer (HCC)    Psoriasis    Psoriatic arthritis (HCC) 2020  [facility]   Rheumatoid arthritis (HCC) 2017   Seasonal allergies    Smoker    SOB (shortness of breath)     Past Surgical History:  Procedure Laterality Date   ABDOMINAL HYSTERECTOMY     Laparoscopic total hysterectomy for fibroids and bilateral salpingenctomy   ANKLE SURGERY  Left    X 3   COLONOSCOPY W/ POLYPECTOMY     CYSTOSCOPY  11/30/2011   Procedure: CYSTOSCOPY;  Surgeon: Alison Murray, MD;  Location: [facility];  Service: Gynecology;  Laterality: N/A;   DIAGNOSTIC LAPAROSCOPY  03/30/1983   ectopic   DILATION AND CURETTAGE OF UTERUS     TRANSFORAMINAL LUMBAR INTERBODY FUSION W/ MIS 2 LEVEL N/A 09/11/2020   Procedure: Minimally invasive fusion Lumbar four-five, Lumbar five-Sacral one;  Surgeon: Bedelia Person, MD;  Location: [facility] OR;  Service: Neurosurgery;  Laterality: N/A;   TUBAL LIGATION     WISDOM TOOTH EXTRACTION      Social History   Socioeconomic History   Marital status: Widowed    Spouse name: Not on file   Number of children: 2   Years of education: 42 + 2   Highest education level: Not on file  Occupational History   Occupation: RETIRED - CLERICAL  Tobacco Use   Smoking status: Former    Current packs/day: 0.00    Average packs/day: 1 pack/day for 39.0 years (39.0 ttl pk-yrs)    Types: Cigarettes    Start date: 1    Quit date: 2022    Years since quitting: 3.1   Smokeless tobacco: Never  Vaping Use   Vaping status: Never Used  Substance and Sexual Activity   Alcohol use: Yes    Alcohol/week: 2.0 standard drinks of alcohol    Types: 2 Glasses of wine per week    Comment: occas   Drug use: No   Sexual activity: Not Currently    Partners: Male    Birth control/protection: Surgical    Comment: Tubal/R-TLH  Other Topics Concern   Not on file  Social History Narrative   Not on file   Social Drivers of Health   Financial Resource Strain: Not on file  Food Insecurity: No Food Insecurity (04/05/2023)   Hunger Vital Sign    Worried About Running Out of Food in the Last Year: Never true    Ran Out of Food in the Last Year: Never true  Transportation Needs: No Transportation Needs (04/05/2023)   PRAPARE - Administrator, Civil Service (Medical): No    Lack of Transportation (Non-Medical): No  Physical Activity:  Not on file  Stress: Not on file  Social Connections: Not on file  Intimate Partner Violence: Not At Risk (04/05/2023)   Humiliation, Afraid, Rape, and Kick questionnaire    Fear of Current or Ex-Partner: No    Emotionally Abused: No    Physically Abused: No    Sexually Abused: No    Family History  Problem Relation Age of Onset   Osteoporosis Mother    Cancer Mother        renal cell, brain   CAD Father    CAD Paternal Uncle    Parkinson's disease Maternal Grandfather    Cervical cancer Paternal Grandmother    CAD Paternal Grandfather      Current Outpatient Medications:    ALPRAZolam (XANAX) 0.25 MG tablet, Take 0.25 mg by mouth 2 (two) times daily as needed for anxiety., Disp: , Rfl:    amLODipine (NORVASC) 5  MG tablet, Take 5 mg by mouth daily., Disp: , Rfl:    atenolol (TENORMIN) 50 MG tablet, Take 50 mg by mouth daily., Disp: , Rfl:    HYDROcodone bit-homatropine (HYCODAN) 5-1.5 MG/5ML syrup, Take 5 mLs by mouth every 6 (six) hours as needed for cough., Disp: 473 mL, Rfl: 0   lisinopril (PRINIVIL,ZESTRIL) 20 MG tablet, Take 20 mg by mouth daily., Disp: , Rfl:    loperamide (IMODIUM A-D) 2 MG tablet, Take 2 mg by mouth 4 (four) times daily as needed for diarrhea or loose stools., Disp: , Rfl:    omeprazole (PRILOSEC) 40 MG capsule, Take 40 mg by mouth daily., Disp: , Rfl:    oxyCODONE (OXY IR/ROXICODONE) 5 MG immediate release tablet, Take 1 tablet (5 mg total) by mouth every 3 (three) hours as needed for moderate pain ((score 4 to 6))., Disp: 30 tablet, Rfl: 0   predniSONE (DELTASONE) 10 MG tablet, 6 tabs po day 1, 5 tabs po day 2, 4 tabs po day 3, 3 tabs po day 4, 2 tabs po day 5, 1 tab po day 6, then off, Disp: 21 tablet, Rfl: 0   spironolactone (ALDACTONE) 25 MG tablet, Take 25 mg by mouth daily., Disp: , Rfl:    Tofacitinib Citrate ER (XELJANZ XR) 11 MG TB24, Take 11 mg by mouth daily. Okay to restart 09/26/20, Disp: 30 tablet, Rfl:    venlafaxine XR (EFFEXOR XR) 75 MG 24 hr  capsule, Take 1 capsule (75 mg total) by mouth daily with breakfast., Disp: 90 capsule, Rfl: 3  Physical exam:  Vitals:   05/23/23 1106 05/23/23 1108  BP: (!) 141/91 134/86  Pulse: 77   Resp: 20   Temp: 98.1 F (36.7 C)   TempSrc: Oral   SpO2: 96%   Weight: 258 lb 12.8 oz (117.4 kg)   Height: 6' (1.829 m)    Physical Exam Vitals reviewed.  Constitutional:      Appearance: She is not ill-appearing.     Comments: Accompanied by sister  Cardiovascular:     Rate and Rhythm: Normal rate and regular rhythm.  Pulmonary:     Effort: No respiratory distress.  Abdominal:     General: There is no distension.  Musculoskeletal:        General: No deformity.  Skin:    Coloration: Skin is not pale.  Neurological:     Mental Status: She is alert and oriented to person, place, and time.  Psychiatric:        Mood and Affect: Mood normal.        Behavior: Behavior normal.         Latest Ref Rng & Units 05/17/2023    1:46 PM  CMP  Glucose 70 - 99 mg/dL 098   BUN 8 - 23 mg/dL 7   Creatinine 1.19 - 1.47 mg/dL 8.29   Sodium 562 - 130 mmol/L 133   Potassium 3.5 - 5.1 mmol/L 4.5   Chloride 98 - 111 mmol/L 95   CO2 22 - 32 mmol/L 25   Calcium 8.9 - 10.3 mg/dL 86.5   Total Protein 6.5 - 8.1 g/dL 7.8   Total Bilirubin 0.0 - 1.2 mg/dL 0.2   Alkaline Phos 38 - 126 U/L 125   AST 15 - 41 U/L 21   ALT 0 - 44 U/L 18       Latest Ref Rng & Units 05/17/2023    1:46 PM  CBC  WBC 4.0 - 10.5 K/uL 7.9  Hemoglobin 12.0 - 15.0 g/dL 19.1   Hematocrit 47.8 - 46.0 % 31.5   Platelets 150 - 400 K/uL 474     US BIOPSY (LIVER) Result Date: 05/12/2023 INDICATION: Lung carcinoma and liver lesions. EXAM: ULTRASOUND GUIDED CORE BIOPSY OF LIVER LESION MEDICATIONS: None. ANESTHESIA/SEDATION: Moderate (conscious) sedation was employed during this procedure. A total of Versed 2.0 mg and Fentanyl 100 mcg was administered intravenously. Moderate Sedation Time: 27 minutes. The patient's level of consciousness  and vital signs were monitored continuously by radiology nursing throughout the procedure under my direct supervision. PROCEDURE: The procedure, risks, benefits, and alternatives were explained to the patient. Questions regarding the procedure were encouraged and answered. The patient understands and consents to the procedure. A time-out was performed prior to initiating the procedure. The abdominal wall was prepped with chlorhexidine in a sterile fashion, and a sterile drape was applied covering the operative field. A sterile gown and sterile gloves were used for the procedure. Local anesthesia was provided with 1% Lidocaine. Ultrasound was used to localize liver lesions. Under ultrasound guidance, a 17 gauge trocar needle was advanced to the level of a lesion within the right lobe of the liver. After confirming needle tip position, 4 separate coaxial 18 gauge core biopsy samples were obtained with an automated biopsy device. Core biopsy samples were submitted in formalin. Gel-Foam pledgets were then advanced through the outer needle as the needle was retracted and removed. Additional ultrasound was performed. COMPLICATIONS: None immediate. FINDINGS: The best visualized focal lesion in the liver is a rounded hypoechoic lesion in the right lobe adjacent to the gallbladder measuring up to approximately 1.7 cm in maximum diameter. Solid tissue was obtained. IMPRESSION: Ultrasound-guided core biopsy performed of a lesion within the right lobe of the liver near the gallbladder fossa. Electronically Signed   By: Irish Lack M.D.   On: 05/12/2023 09:41     Assessment and plan-  The patient is a 63 y.o. female who presents to [facility] for initial meeting in preparation for starting chemotherapy for the treatment of stage IVB Lung cancer.   Chemo Care Clinic/High Risk for ER/Hospitalization during chemotherapy- We discussed the role of the chemo care clinic and identified patient specific risk factors. I  discussed that patient was identified as high risk primarily based on:  Patient has past medical history positive for: Past Medical History:  Diagnosis Date   Anemia    Anxiety    Depression    GERD (gastroesophageal reflux disease)    History of EKG 08/2011   was reflux-done at [facility] hospital-will request   Hypertension    atenolol and norvasc   Lung cancer (HCC)    Psoriasis    Psoriatic arthritis (HCC) 2020   [facility]   Rheumatoid arthritis (HCC) 2017   Seasonal allergies    Smoker    SOB (shortness of breath)    Patient has past surgical history positive for: Past Surgical History:  Procedure Laterality Date   ABDOMINAL HYSTERECTOMY     Laparoscopic total hysterectomy for fibroids and bilateral salpingenctomy   ANKLE SURGERY Left    X 3   COLONOSCOPY W/ POLYPECTOMY     CYSTOSCOPY  11/30/2011   Procedure: CYSTOSCOPY;  Surgeon: Alison Murray, MD;  Location: [facility];  Service: Gynecology;  Laterality: N/A;   DIAGNOSTIC LAPAROSCOPY  03/30/1983   ectopic   DILATION AND CURETTAGE OF UTERUS     TRANSFORAMINAL LUMBAR INTERBODY FUSION W/ MIS 2 LEVEL N/A 09/11/2020  Procedure: Minimally invasive fusion Lumbar four-five, Lumbar five-Sacral one;  Surgeon: Bedelia Person, MD;  Location: [facility] OR;  Service: Neurosurgery;  Laterality: N/A;   TUBAL LIGATION     WISDOM TOOTH EXTRACTION     Provided general information including the following:  1.  Date of education: 05/23/23 2.  Physician name: Dr. Melvyn Neth 3.  Diagnosis: Squamous cell carcinoma of the lung 4.  Stage: Stage IVB 5.  Cure: palliative 6.  Chemotherapy plan including drugs and how often: carboplatin - paclitaxel every 3 weeks with plan for 4-6 cycles.  7.  Start date: 05/26/23 8.  Other referrals: None at this time 9.  The patient is to call our office with any questions or concerns.  Our office number [phone], if after hours or on the weekend, call the same number and wait for the answering service.  There is  always an oncologist on call. 10.  Medications prescribed: ondansetron, prochlorperazine, decadron, olanzapine 5 mg d/t insomnia, anxiety, poor appetite 11.  The patient has verbalized understanding of the treatment plan and has no barriers to adherence or understanding.   Obtained signed consent from patient.   Today we discussed the chemotherapy medications that she would be receiving as well as GCSF support. Discussed symptoms including:  1.  Low blood counts including white blood cells red blood cells, and platelets.  If experience increased fatigue or abnormal bruising or bleeding, call our office. 2.  Infection including to avoid large crowds, wash hands frequently, and stay away from people who were sick.  If fever develops of 100.4 or higher, call our office. 3.  Mucositis:  Instructions on mouth rinse given (baking soda and salt mixture).  Keep mouth clean.  Use soft bristle toothbrush.  Avoid alcohol containing mouthwash.  If mouth sores develop, call our office. 4.  Nausea/vomiting:  Prescriptions given: ondansetron 8 mg every 8 hours as needed for nausea or vomiting and prochlorperazine 10 mg every 6 hours as needed for nausea or vomiting, may alternate these medications and take around the clock if persistent.  If nausea and vomiting is not controlled, call our office. Also reviewed use of decadron starting day after chemo then for 3 days.  5.  Diarrhea: Use over-the-counter Imodium.  Call our office if diarrhea is not controlled. 6.  Constipation: Use senna-S, 1 to 2 tablets twice a day.  Call our office if no BM in 2 to 3 days. 7.  Loss of appetite:  Try to eat small meals every 2-3 hours.  Call our office if not able to eat or drink. 8.  Taste changes:  Try zinc 50 mg daily.  If becomes severe call clinic. Can consider nutrition appt if she'd like.  9.  Drink 2 to 3 quarts of water per day. Call our office if not able to drink enough for urine to be pale yellow. 10. Avoid alcoholic  beverages. 11. Peripheral neuropathy: Call office if numbness or tingling in hands or feet worsens or is suddenly severe. Reviewed option of cold therapy for prevention.  12.  Ringing in the ears or hearing loss.  Call our office if this develops. 13. Hair loss- expected with chemotherapy 14. Anxiety & insomnia- prescription for olanzapine 5 mg sent to pharmacy. Take daily. Can increase to 10 mg daily if needed.  15. Port- a-cath: scheduled later this week. Prescription for EMLA and instructions for use provided.   Due to risk of neutropenia, peg-filgrastim (Neulasta) will be given 24 to 48 hours  after chemotherapy.  Gave information sheet on bone and joint pain.  Use Claritin or Pepcid.  May use ibuprofen or Aleve.  Call if symptoms persist or are unbearable.   The patient was given written information printed from [employer] patient education on individual chemotherapy agents which includes: Name of medications Approved uses Dose and schedule Storage and handling Handling body fluids and waste Drug and food interactions Possible side effects and management Sexual activity  Obtaining medication   Gave information on the supportive care team and how to contact them regarding services.  Discussed advanced directives.  The patient does not have their advanced directives.  We discussed that social determinants of health may have significant impacts on health and outcomes for cancer patients.  Today we discussed specific social determinants of performance status, alcohol use, depression, financial needs, food insecurity, housing, interpersonal violence, social connections, stress, tobacco use, and transportation.    We discussed a variety of services available to her in the community and through [facility] including:    Outpatient services: We discussed options including home based and outpatient services, DME, nutrition counseling, and supportive care program. We discussed that patients who  participate in regular physical activity report fewer negative impacts of cancer and treatments and report less fatigue.   Financial Concerns: We discussed that living with cancer can create tremendous financial burden.  We discussed options for assistance. I asked that if assistance is needed in affording medications or paying bills to please let us know so that we can provide assistance. We discussed options for food including social services.  Referral to Social work:  Introduced services available, such as support with utility bill, cell phone and gas vouchers.  Introduced [place], [place] who can provide individual counseling.    Support groups: We discussed options for support groups at [facility]. We discussed options for managing stress including healthy eating and exercise, as well as participating in no charge counseling services at the cancer center and support groups.  If these are of interest, patient can notify either myself or primary nursing team.We discussed options for management including medications.  Transportation: We discussed options for transportation.  The patient will contact our office if female requires assistance with transportation.  Palliative care services: We have palliative care services available in the cancer center to discuss goals of care and advanced care planning.  Please let us know if you have any questions or would like to speak to our palliative care practitioner.  Symptom Management Clinic: We discussed our symptom management clinic which is available for acute concerns while receiving treatment such as nausea, vomiting or diarrhea.  We can be reached via telephone at [phone].  We are available for virtual or in person visits on the same day from 9 to 4 PM Monday through Friday.   female denies needing specific assistance at this time.  female will be followed by Dr. Theadore Nan clinical team.  Visit Diagnosis 1. Cancer of hilus of right lung (HCC)    2. Metastasis to liver [facility])    Patient expressed understanding and was in agreement with this plan. She also understands that She can call clinic at any time with any questions, concerns, or complaints.   A total of (45) minutes of face-to-face time was spent with this patient with greater than 50% of that time in counseling and care-coordination.  Consuello Masse, DNP, AGNP-C [facility]

## 2023-05-24 ENCOUNTER — Other Ambulatory Visit: Payer: Self-pay

## 2023-05-25 ENCOUNTER — Encounter: Payer: Self-pay | Admitting: Oncology

## 2023-05-26 ENCOUNTER — Encounter: Payer: Self-pay | Admitting: Dietician

## 2023-05-26 ENCOUNTER — Inpatient Hospital Stay

## 2023-05-26 ENCOUNTER — Telehealth: Payer: Self-pay | Admitting: Dietician

## 2023-05-26 ENCOUNTER — Other Ambulatory Visit: Payer: Self-pay

## 2023-05-26 VITALS — BP 121/87 | HR 84 | Temp 98.1°F | Resp 22 | Ht 73.0 in | Wt 256.0 lb

## 2023-05-26 VITALS — BP 138/90 | HR 83 | Resp 22

## 2023-05-26 DIAGNOSIS — Z5111 Encounter for antineoplastic chemotherapy: Secondary | ICD-10-CM | POA: Diagnosis not present

## 2023-05-26 DIAGNOSIS — C3401 Malignant neoplasm of right main bronchus: Secondary | ICD-10-CM

## 2023-05-26 DIAGNOSIS — C787 Secondary malignant neoplasm of liver and intrahepatic bile duct: Secondary | ICD-10-CM

## 2023-05-26 LAB — CBC WITH DIFFERENTIAL (CANCER CENTER ONLY)
Abs Immature Granulocytes: 0.1 10*3/uL — ABNORMAL HIGH (ref 0.00–0.07)
Basophils Absolute: 0 10*3/uL (ref 0.0–0.1)
Basophils Relative: 0 %
Eosinophils Absolute: 0 10*3/uL (ref 0.0–0.5)
Eosinophils Relative: 0 %
HCT: 33.8 % — ABNORMAL LOW (ref 36.0–46.0)
Hemoglobin: 11.3 g/dL — ABNORMAL LOW (ref 12.0–15.0)
Immature Granulocytes: 1 %
Lymphocytes Relative: 4 %
Lymphs Abs: 0.4 10*3/uL — ABNORMAL LOW (ref 0.7–4.0)
MCH: 30.6 pg (ref 26.0–34.0)
MCHC: 33.4 g/dL (ref 30.0–36.0)
MCV: 91.6 fL (ref 80.0–100.0)
Monocytes Absolute: 0.6 10*3/uL (ref 0.1–1.0)
Monocytes Relative: 6 %
Neutro Abs: 8.6 10*3/uL — ABNORMAL HIGH (ref 1.7–7.7)
Neutrophils Relative %: 89 %
Platelet Count: 374 10*3/uL (ref 150–400)
RBC: 3.69 MIL/uL — ABNORMAL LOW (ref 3.87–5.11)
RDW: 12.9 % (ref 11.5–15.5)
WBC Count: 9.7 10*3/uL (ref 4.0–10.5)
nRBC: 0 % (ref 0.0–0.2)
nRBC: 0 /100{WBCs}

## 2023-05-26 LAB — CMP (CANCER CENTER ONLY)
ALT: 22 U/L (ref 0–44)
AST: 16 U/L (ref 15–41)
Albumin: 3.6 g/dL (ref 3.5–5.0)
Alkaline Phosphatase: 104 U/L (ref 38–126)
Anion gap: 12 (ref 5–15)
BUN: 7 mg/dL — ABNORMAL LOW (ref 8–23)
CO2: 26 mmol/L (ref 22–32)
Calcium: 10.2 mg/dL (ref 8.9–10.3)
Chloride: 96 mmol/L — ABNORMAL LOW (ref 98–111)
Creatinine: 0.68 mg/dL (ref 0.44–1.00)
GFR, Estimated: >60 mL/min (ref >60)
Glucose, Bld: 131 mg/dL — ABNORMAL HIGH (ref 70–99)
Potassium: 3.1 mmol/L — ABNORMAL LOW (ref 3.5–5.1)
Sodium: 134 mmol/L — ABNORMAL LOW (ref 135–145)
Total Bilirubin: 0.4 mg/dL (ref 0.0–1.2)
Total Protein: 7.4 g/dL (ref 6.5–8.1)

## 2023-05-26 MED ORDER — PALONOSETRON HCL INJECTION 0.25 MG/5ML
0.2500 mg | Freq: Once | INTRAVENOUS | Status: AC
  Administered 2023-05-26: 0.25 mg via INTRAVENOUS
  Filled 2023-05-26: qty 5

## 2023-05-26 MED ORDER — SODIUM CHLORIDE 0.9 % IV SOLN
150.0000 mg | Freq: Once | INTRAVENOUS | Status: AC
  Administered 2023-05-26: 150 mg via INTRAVENOUS
  Filled 2023-05-26: qty 150

## 2023-05-26 MED ORDER — FAMOTIDINE IN NACL 20-0.9 MG/50ML-% IV SOLN
20.0000 mg | Freq: Once | INTRAVENOUS | Status: AC
  Administered 2023-05-26: 20 mg via INTRAVENOUS
  Filled 2023-05-26: qty 50

## 2023-05-26 MED ORDER — SODIUM CHLORIDE 0.9% FLUSH
10.0000 mL | INTRAVENOUS | Status: DC | PRN
  Administered 2023-05-26: 10 mL

## 2023-05-26 MED ORDER — SODIUM CHLORIDE 0.9 % IV SOLN
600.0000 mg | Freq: Once | INTRAVENOUS | Status: AC
  Administered 2023-05-26: 600 mg via INTRAVENOUS
  Filled 2023-05-26: qty 60

## 2023-05-26 MED ORDER — DIPHENHYDRAMINE HCL 50 MG/ML IJ SOLN
50.0000 mg | Freq: Once | INTRAMUSCULAR | Status: AC
  Administered 2023-05-26: 50 mg via INTRAVENOUS
  Filled 2023-05-26: qty 1

## 2023-05-26 MED ORDER — POTASSIUM CHLORIDE ER 10 MEQ PO CPCR: 10.0000 meq | ORAL_CAPSULE | Freq: Two times a day (BID) | ORAL | 0 refills | Status: DC

## 2023-05-26 MED ORDER — HEPARIN SOD (PORK) LOCK FLUSH 100 UNIT/ML IV SOLN
500.0000 [IU] | Freq: Once | INTRAVENOUS | Status: AC | PRN
  Administered 2023-05-26: 500 [IU]

## 2023-05-26 MED ORDER — SODIUM CHLORIDE 0.9 % IV SOLN: INTRAVENOUS | Status: DC

## 2023-05-26 MED ORDER — SODIUM CHLORIDE 0.9 % IV SOLN
145.0000 mg/m2 | Freq: Once | INTRAVENOUS | Status: AC
  Administered 2023-05-26: 354 mg via INTRAVENOUS
  Filled 2023-05-26: qty 59

## 2023-05-26 MED ORDER — DEXAMETHASONE SODIUM PHOSPHATE 10 MG/ML IJ SOLN
10.0000 mg | Freq: Once | INTRAMUSCULAR | Status: AC
  Administered 2023-05-26: 10 mg via INTRAVENOUS
  Filled 2023-05-26: qty 1

## 2023-05-26 NOTE — Progress Notes (Addendum)
..  Pharmacist Chemotherapy Monitoring - Initial Assessment    Anticipated start date: 05/26/23  HOLDING PEMBROLIZUMAB DUE TO SEVERE PSORIATIC ARTHRITIS.  DOSED REDUCED PER MD REQUEST. PATIENT CURRENTLY TAKING TOFACITINIB  The following has been reviewed per standard work regarding the patient's treatment regimen: The patient's diagnosis, treatment plan and drug doses, and organ/hematologic function Lab orders and baseline tests specific to treatment regimen  The treatment plan start date, drug sequencing, and pre-medications Prior authorization status  Patient's documented medication list, including drug-drug interaction screen and prescriptions for anti-emetics and supportive care specific to the treatment regimen The drug concentrations, fluid compatibility, administration routes, and timing of the medications to be used The patient's access for treatment and lifetime cumulative dose history, if applicable  The patient's medication allergies and previous infusion related reactions, if applicable   Changes made to treatment plan:  N/A  Follow up needed:  N/A   Domenic Schwab, [facility], 05/26/2023  9:56 AM

## 2023-05-26 NOTE — Telephone Encounter (Signed)
 RN relayed that she is a little overwhelmed today with a new treatment and her main problem is not having an appetite for anything/ wanting small portions.  Emailed nutrition tips for poor appetite with contact information to schedule a remote nutrition consult next month.  Gennaro Africa, RDN, LDN Registered Dietitian, [facility] Part Time Remote (Usual office hours: Tuesday-Thursday) Mobile: [phone] Remote Office: [phone]

## 2023-05-26 NOTE — Patient Instructions (Signed)
 [facility] [place] - A DEPT OF [facility]  Discharge Instructions: Thank you for choosing [facility] to provide your oncology and hematology care.  If you have a lab appointment with the Cancer Center, please go directly to the Cancer Center and check in at the registration area.   Wear comfortable clothing and clothing appropriate for easy access to any Portacath or PICC line.   We strive to give you quality time with your provider. You may need to reschedule your appointment if you arrive late (15 or more minutes).  Arriving late affects you and other patients whose appointments are after yours.  Also, if you miss three or more appointments without notifying the office, you may be dismissed from the clinic at the provider's discretion.      For prescription refill requests, have your pharmacy contact our office and allow 72 hours for refills to be completed.    Today you received the following chemotherapy and/or immunotherapy agents Paclitaxel & Carboplatin      To help prevent nausea and vomiting after your treatment, we encourage you to take your nausea medication as directed.  BELOW ARE SYMPTOMS THAT SHOULD BE REPORTED IMMEDIATELY: *FEVER GREATER THAN 100.4 F (38 C) OR HIGHER *CHILLS OR SWEATING *NAUSEA AND VOMITING THAT IS NOT CONTROLLED WITH YOUR NAUSEA MEDICATION *UNUSUAL SHORTNESS OF BREATH *UNUSUAL BRUISING OR BLEEDING *URINARY PROBLEMS (pain or burning when urinating, or frequent urination) *BOWEL PROBLEMS (unusual diarrhea, constipation, pain near the anus) TENDERNESS IN MOUTH AND THROAT WITH OR WITHOUT PRESENCE OF ULCERS (sore throat, sores in mouth, or a toothache) UNUSUAL RASH, SWELLING OR PAIN  UNUSUAL VAGINAL DISCHARGE OR ITCHING   Items with * indicate a potential emergency and should be followed up as soon as possible or go to the Emergency Department if any problems should occur.  Please show the CHEMOTHERAPY ALERT CARD or  IMMUNOTHERAPY ALERT CARD at check-in to the Emergency Department and triage nurse.  Should you have questions after your visit or need to cancel or reschedule your appointment, please contact [facility] [place] - A DEPT OF [facility]  Dept: [phone]  and follow the prompts.  Office hours are 8:00 a.m. to 4:30 p.m. Monday - Friday. Please note that voicemails left after 4:00 p.m. may not be returned until the following business day.  We are closed weekends and major holidays. You have access to a nurse at all times for urgent questions. Please call the main number to the clinic Dept: [phone] and follow the prompts.  For any non-urgent questions, you may also contact your provider using MyChart. We now offer e-Visits for anyone 48 and older to request care online for non-urgent symptoms. For details visit [email or web address].   Also download the MyChart app! Go to the app store, search "MyChart", open the app, select , and log in with your MyChart username and password.

## 2023-05-26 NOTE — Telephone Encounter (Signed)
Patient screened on MST. First attempt to reach. Provided my cell# on voice mail  and in a text to return call to set up a nutrition consult.  Cyndi Elisabeth Strom, RDN, LDN Registered Dietitian, [facility] Part Time Remote (Usual office hours: Tuesday-Thursday) Cell: [phone]   

## 2023-05-27 ENCOUNTER — Inpatient Hospital Stay

## 2023-05-27 ENCOUNTER — Telehealth: Payer: Self-pay

## 2023-05-27 VITALS — BP 127/79 | HR 74 | Temp 98.4°F | Resp 16

## 2023-05-27 DIAGNOSIS — Z5111 Encounter for antineoplastic chemotherapy: Secondary | ICD-10-CM | POA: Diagnosis not present

## 2023-05-27 DIAGNOSIS — C3401 Malignant neoplasm of right main bronchus: Secondary | ICD-10-CM

## 2023-05-27 MED ORDER — PEGFILGRASTIM-CBQV 6 MG/0.6ML ~~LOC~~ SOSY
6.0000 mg | PREFILLED_SYRINGE | Freq: Once | SUBCUTANEOUS | Status: AC
Start: 2023-05-27 — End: 2023-05-27
  Administered 2023-05-27: 6 mg via SUBCUTANEOUS
  Filled 2023-05-27: qty 0.6

## 2023-05-27 NOTE — Telephone Encounter (Signed)
 I spoke with pt to see how she is feeling today. She replied, "Marvelous. I'm in disbelief". She denies N/V, fever, chills, skin rash, itching and increased cough (has cough as baseline). "I had a tad of diarrhea but not bad".  I educated her on taking the Imodium, which she was happy to hear. She has some at home and said she usually doesn't have to take but 2 tablets. I told her she could take up to 8 tabs/day if needed. I also encouraged her to drink a cup of fluids for each diarrhea episode to avoid dehydration. I reminded her that she could experience other side effects in the future and to let us know, such as mouth sores, metallic taste, neuropathy, and joint pain. I stressed the importance of calling us if she were to develop temp of 100.4 or higher, day or night. She verbalized understanding and said her sister was writing it down.

## 2023-05-30 ENCOUNTER — Ambulatory Visit

## 2023-05-31 ENCOUNTER — Inpatient Hospital Stay: Attending: Oncology

## 2023-05-31 DIAGNOSIS — C787 Secondary malignant neoplasm of liver and intrahepatic bile duct: Secondary | ICD-10-CM | POA: Insufficient documentation

## 2023-05-31 DIAGNOSIS — C3401 Malignant neoplasm of right main bronchus: Secondary | ICD-10-CM | POA: Insufficient documentation

## 2023-05-31 DIAGNOSIS — Z5189 Encounter for other specified aftercare: Secondary | ICD-10-CM | POA: Insufficient documentation

## 2023-05-31 DIAGNOSIS — Z5111 Encounter for antineoplastic chemotherapy: Secondary | ICD-10-CM | POA: Insufficient documentation

## 2023-05-31 NOTE — Progress Notes (Signed)
 [facility] Clinical Social Work  Clinical Social Work was referred by nurse for assessment of psychosocial needs.  Clinical Social Worker contacted patient by phone to offer support and assess for needs. Patient expressed initial nerves with tolerating treatment, but reported tolerating treatment well. Patient inquired about billing questions, patient received bill from [facility] and wanted to know if she met her OOP. CSW informed patient that more bills should be arriving for January / February treatment. CSW called [facility] Billing on patient's behalf. CSW was informed bills have not been processed at this time. CSW will update patient on 3/06     Marguerita Merles, LCSW  Clinical Social Worker [facility]

## 2023-06-01 NOTE — Progress Notes (Deleted)
 [facility]  [address] [place],  [place]  [address] [phone]  Clinic Day:  06/01/2023  Referring physician: Nonnie Done., MD   HISTORY OF PRESENT ILLNESS:  The patient is a 63 y.o. female with stage IVB (T2a N2a M1c1) squamous cell lung cancer, which includes spread of disease to her liver.  She comes in today to review her liver biopsy results and their implications.  She did undergo Guardant testing of her biopsied lung tissue, for which no mutations/alterations were seen.  She completed palliative radiation to her lung mass, which was given due to it causing hemoptysis.  Her hemoptysis has virtually dissipated after her radiation; however, she has a chronic cough, which can last throughout the entire day.  She denies having other symptoms which concern her for overt signs of disease progression.      PHYSICAL EXAM:  Last menstrual period 11/15/2011. Wt Readings from Last 3 Encounters:  05/26/23 256 lb (116.1 kg)  05/23/23 258 lb 12.8 oz (117.4 kg)  05/17/23 259 lb 8 oz (117.7 kg)   There is no height or weight on file to calculate BMI. Performance status (ECOG): {CHL ONC [id number] Physical Exam  LABS:      Latest Ref Rng & Units 05/26/2023    9:04 AM 05/17/2023    1:46 PM 05/10/2023   11:36 AM  CBC  WBC 4.0 - 10.5 K/uL 9.7  7.9  11.4   Hemoglobin 12.0 - 15.0 g/dL 91.4  78.2  95.6   Hematocrit 36.0 - 46.0 % 33.8  31.5  31.7   Platelets 150 - 400 K/uL 374  474  443       Latest Ref Rng & Units 05/26/2023    9:04 AM 05/17/2023    1:46 PM 04/05/2023    1:34 PM  CMP  Glucose 70 - 99 mg/dL 213  086  578   BUN 8 - 23 mg/dL 7  7  7    Creatinine 0.44 - 1.00 mg/dL 4.69  6.29  5.28   Sodium 135 - 145 mmol/L 134  133  129   Potassium 3.5 - 5.1 mmol/L 3.1  4.5  4.7   Chloride 98 - 111 mmol/L 96  95  93   CO2 22 - 32 mmol/L 26  25  24    Calcium 8.9 - 10.3 mg/dL 41.3  24.4  01.0   Total Protein 6.5 - 8.1 g/dL 7.4  7.8  7.7   Total  Bilirubin 0.0 - 1.2 mg/dL 0.4  0.2  0.3   Alkaline Phos 38 - 126 U/L 104  125  107   AST 15 - 41 U/L 16  21  35   ALT 0 - 44 U/L 22  18  27       No results found for: "CEA1", "CEA" / No results found for: "CEA1", "CEA" No results found for: "PSA1" No results found for: "UVO536" No results found for: "CAN125"  No results found for: "TOTALPROTELP", "ALBUMINELP", "A1GS", "A2GS", "BETS", "BETA2SER", "GAMS", "MSPIKE", "SPEI" No results found for: "TIBC", "FERRITIN", "IRONPCTSAT" No results found for: "LDH"  No results found for: "AFPTUMOR", "TOTALPROTELP", "ALBUMINELP", "A1GS", "A2GS", "BETS", "BETA2SER", "GAMS", "MSPIKE", "SPEI", "LDH", "CEA1", "CEA", "PSA1", "IGASERUM", "IGGSERUM", "IGMSERUM", "THGAB", "THYROGLB"  Review Flowsheet        No data to display           STUDIES:  US BIOPSY (LIVER) Result Date: 05/12/2023 INDICATION: Lung carcinoma and liver lesions. EXAM: ULTRASOUND GUIDED CORE  BIOPSY OF LIVER LESION MEDICATIONS: None. ANESTHESIA/SEDATION: Moderate (conscious) sedation was employed during this procedure. A total of Versed 2.0 mg and Fentanyl 100 mcg was administered intravenously. Moderate Sedation Time: 27 minutes. The patient's level of consciousness and vital signs were monitored continuously by radiology nursing throughout the procedure under my direct supervision. PROCEDURE: The procedure, risks, benefits, and alternatives were explained to the patient. Questions regarding the procedure were encouraged and answered. The patient understands and consents to the procedure. A time-out was performed prior to initiating the procedure. The abdominal wall was prepped with chlorhexidine in a sterile fashion, and a sterile drape was applied covering the operative field. A sterile gown and sterile gloves were used for the procedure. Local anesthesia was provided with 1% Lidocaine. Ultrasound was used to localize liver lesions. Under ultrasound guidance, a 17 gauge trocar needle was  advanced to the level of a lesion within the right lobe of the liver. After confirming needle tip position, 4 separate coaxial 18 gauge core biopsy samples were obtained with an automated biopsy device. Core biopsy samples were submitted in formalin. Gel-Foam pledgets were then advanced through the outer needle as the needle was retracted and removed. Additional ultrasound was performed. COMPLICATIONS: None immediate. FINDINGS: The best visualized focal lesion in the liver is a rounded hypoechoic lesion in the right lobe adjacent to the gallbladder measuring up to approximately 1.7 cm in maximum diameter. Solid tissue was obtained. IMPRESSION: Ultrasound-guided core biopsy performed of a lesion within the right lobe of the liver near the gallbladder fossa. Electronically Signed   By: Irish Lack M.D.   On: 05/12/2023 09:41      ASSESSMENT & PLAN:   Assessment/Plan:  A 63 y.o. female with *** .The patient understands all the plans discussed today and is in agreement with them.      Zenovia Justman Kirby Funk, MD

## 2023-06-02 ENCOUNTER — Other Ambulatory Visit: Payer: Self-pay | Admitting: Oncology

## 2023-06-02 ENCOUNTER — Inpatient Hospital Stay: Attending: Oncology | Admitting: Oncology

## 2023-06-02 ENCOUNTER — Inpatient Hospital Stay

## 2023-06-02 DIAGNOSIS — R110 Nausea: Secondary | ICD-10-CM

## 2023-06-02 NOTE — Progress Notes (Signed)
 [facility] CSW Progress Note  Visual merchandiser met with patient and sister in person. CSW informed patient insurance claims from January / February DOS have not been processed. CSW provided direct information for patient to follow up once bills are received.  Marguerita Merles, LCSW Clinical Social Worker [facility]

## 2023-06-09 ENCOUNTER — Telehealth: Payer: Self-pay | Admitting: Dietician

## 2023-06-09 NOTE — Telephone Encounter (Signed)
 Patient screened on MST. Second attempt to reach. Provided my cell# on voice mail and in a text to return call to set up a nutrition consult.  Gennaro Africa, RDN, LDN Registered Dietitian, [facility] Part Time Remote (Usual office hours: Tuesday-Thursday) Cell: [phone]

## 2023-06-14 NOTE — Progress Notes (Unsigned)
 [facility]  [address] [place],  [place]  [address] [phone]  Clinic Day:  06/15/2023  Referring physician: Nonnie Done., MD   HISTORY OF PRESENT ILLNESS:  The patient is a 63 y.o. female with stage IVB (T2a N2a M1c1) squamous cell lung cancer, which includes spread of disease to her liver.  She comes in today to be evaluated before heading into her 2nd cycle of carboplatin/paclitaxel.  The patient claims to have tolerated her 1st cycle of chemotherapy fairly well.  However, over the past week, she has had increased nausea and vomiting.  This did not happen with her first 2 weeks of treatment.  Furthermore, she has gotten progressively short of breath to where she can barely walk a few feet before becoming extremely dyspneic.  The patient does not believe her chemotherapy is factoring into her current symptoms.  She did undergo Guardant testing of her biopsied lung tissue, for which no mutations/alterations were seen.  She completed palliative radiation to her lung mass, which was given due to it causing hemoptysis.  Her hemoptysis has dissipated after her radiation.  Her shortness of breath had also improved before it came problematic again approximately 1 week ago.  PHYSICAL EXAM:  Blood pressure 120/77, pulse (!) 113, temperature 98.9 F (37.2 C), temperature source Oral, resp. rate 16, height 6' (1.829 m), last menstrual period 11/15/2011, SpO2 94%. Wt Readings from Last 3 Encounters:  05/26/23 256 lb (116.1 kg)  05/23/23 258 lb 12.8 oz (117.4 kg)  05/17/23 259 lb 8 oz (117.7 kg)   Body mass index is 34.72 kg/m. Performance status (ECOG): 2 - Symptomatic, <50% confined to bed Physical Exam Constitutional:      Appearance: Normal appearance. She is not ill-appearing.     Comments: A pleasant woman in moderate distress.  She is very nauseated.  HENT:     Mouth/Throat:     Mouth: Mucous membranes are moist.     Pharynx: Oropharynx is  clear. No oropharyngeal exudate or posterior oropharyngeal erythema.  Cardiovascular:     Rate and Rhythm: Regular rhythm. Tachycardia present.     Heart sounds: No murmur heard.    No friction rub. No gallop.  Pulmonary:     Effort: Pulmonary effort is normal. No respiratory distress.     Breath sounds: Normal breath sounds. No wheezing, rhonchi or rales.  Abdominal:     General: Bowel sounds are normal. There is no distension.     Palpations: Abdomen is soft. There is no mass.     Tenderness: There is no abdominal tenderness.  Musculoskeletal:        General: No swelling.     Right lower leg: No edema.     Left lower leg: No edema.  Lymphadenopathy:     Cervical: No cervical adenopathy.     Upper Body:     Right upper body: No supraclavicular or axillary adenopathy.     Left upper body: No supraclavicular or axillary adenopathy.     Lower Body: No right inguinal adenopathy. No left inguinal adenopathy.  Skin:    General: Skin is warm.     Coloration: Skin is not jaundiced.     Findings: No lesion or rash.  Neurological:     General: No focal deficit present.     Mental Status: She is alert and oriented to person, place, and time. Mental status is at baseline.  Psychiatric:        Mood and  Affect: Mood normal.        Behavior: Behavior normal.        Thought Content: Thought content normal.    LABS:      Latest Ref Rng & Units 06/15/2023    9:51 AM 05/26/2023    9:04 AM 05/17/2023    1:46 PM  CBC  WBC 4.0 - 10.5 K/uL 10.6  9.7  7.9   Hemoglobin 12.0 - 15.0 g/dL 9.8  52.8  41.3   Hematocrit 36.0 - 46.0 % 29.3  33.8  31.5   Platelets 150 - 400 K/uL 407  374  474       Latest Ref Rng & Units 06/15/2023    9:51 AM 05/26/2023    9:04 AM 05/17/2023    1:46 PM  CMP  Glucose 70 - 99 mg/dL 244  010  272   BUN 8 - 23 mg/dL 7  7  7    Creatinine 0.44 - 1.00 mg/dL 5.36  6.44  0.34   Sodium 135 - 145 mmol/L 132  134  133   Potassium 3.5 - 5.1 mmol/L 3.9  3.1  4.5   Chloride 98  - 111 mmol/L 96  96  95   CO2 22 - 32 mmol/L 20  26  25    Calcium 8.9 - 10.3 mg/dL 74.2  59.5  63.8   Total Protein 6.5 - 8.1 g/dL 8.0  7.4  7.8   Total Bilirubin 0.0 - 1.2 mg/dL 0.5  0.4  0.2   Alkaline Phos 38 - 126 U/L 140  104  125   AST 15 - 41 U/L 19  16  21    ALT 0 - 44 U/L 13  22  18      ASSESSMENT & PLAN:  Assessment/Plan:  A 63 y.o. female with stage IVB (T2a N2a M1c1) squamous cell lung cancer, which includes spread of disease to her liver.  In clinic today, the patient was in fairly significant discomfort.  Although her oxygen level did not particularly fall when walking, she became much more dyspneic upon ambulation.  Furthermore, she had resting tachycardia.  Based upon these findings, my concern is this patient may have a pulmonary embolus.  I will send her for a stat CT angiogram today.  With respect to her nausea, Decadron 10 mg and Zofran 8 mg IV will both be given to ameliorate this problem.  I will notify the patient of her CT angiogram results as soon as they become available.  If she is better tomorrow, she will proceed with her second cycle of carboplatin/paclitaxel at that time.  The patient understands all the plans discussed today and is in agreement with them.    Carlesha Seiple Kirby Funk, MD

## 2023-06-15 ENCOUNTER — Inpatient Hospital Stay (HOSPITAL_BASED_OUTPATIENT_CLINIC_OR_DEPARTMENT_OTHER): Admitting: Oncology

## 2023-06-15 ENCOUNTER — Other Ambulatory Visit: Payer: Self-pay | Admitting: Oncology

## 2023-06-15 ENCOUNTER — Inpatient Hospital Stay

## 2023-06-15 ENCOUNTER — Encounter: Payer: Self-pay | Admitting: Oncology

## 2023-06-15 VITALS — BP 115/78 | HR 97 | Resp 20

## 2023-06-15 VITALS — BP 120/77 | HR 113 | Temp 98.9°F | Resp 16 | Ht 72.0 in

## 2023-06-15 DIAGNOSIS — R110 Nausea: Secondary | ICD-10-CM | POA: Insufficient documentation

## 2023-06-15 DIAGNOSIS — J069 Acute upper respiratory infection, unspecified: Secondary | ICD-10-CM

## 2023-06-15 DIAGNOSIS — Z5111 Encounter for antineoplastic chemotherapy: Secondary | ICD-10-CM | POA: Diagnosis present

## 2023-06-15 DIAGNOSIS — Z5189 Encounter for other specified aftercare: Secondary | ICD-10-CM | POA: Diagnosis not present

## 2023-06-15 DIAGNOSIS — C3401 Malignant neoplasm of right main bronchus: Secondary | ICD-10-CM

## 2023-06-15 DIAGNOSIS — C787 Secondary malignant neoplasm of liver and intrahepatic bile duct: Secondary | ICD-10-CM | POA: Diagnosis not present

## 2023-06-15 LAB — CBC WITH DIFFERENTIAL (CANCER CENTER ONLY)
Abs Immature Granulocytes: 0.12 10*3/uL — ABNORMAL HIGH (ref 0.00–0.07)
Basophils Absolute: 0.1 10*3/uL (ref 0.0–0.1)
Basophils Relative: 1 %
Eosinophils Absolute: 0.1 10*3/uL (ref 0.0–0.5)
Eosinophils Relative: 1 %
HCT: 29.3 % — ABNORMAL LOW (ref 36.0–46.0)
Hemoglobin: 9.8 g/dL — ABNORMAL LOW (ref 12.0–15.0)
Immature Granulocytes: 1 %
Lymphocytes Relative: 12 %
Lymphs Abs: 1.3 10*3/uL (ref 0.7–4.0)
MCH: 30.1 pg (ref 26.0–34.0)
MCHC: 33.4 g/dL (ref 30.0–36.0)
MCV: 89.9 fL (ref 80.0–100.0)
Monocytes Absolute: 0.8 10*3/uL (ref 0.1–1.0)
Monocytes Relative: 8 %
Neutro Abs: 8.2 10*3/uL — ABNORMAL HIGH (ref 1.7–7.7)
Neutrophils Relative %: 77 %
Platelet Count: 407 10*3/uL — ABNORMAL HIGH (ref 150–400)
RBC: 3.26 MIL/uL — ABNORMAL LOW (ref 3.87–5.11)
RDW: 14.4 % (ref 11.5–15.5)
WBC Count: 10.6 10*3/uL — ABNORMAL HIGH (ref 4.0–10.5)
nRBC: 0 % (ref 0.0–0.2)
nRBC: 0 /100{WBCs}

## 2023-06-15 LAB — CMP (CANCER CENTER ONLY)
ALT: 13 U/L (ref 0–44)
AST: 19 U/L (ref 15–41)
Albumin: 3.5 g/dL (ref 3.5–5.0)
Alkaline Phosphatase: 140 U/L — ABNORMAL HIGH (ref 38–126)
Anion gap: 16 — ABNORMAL HIGH (ref 5–15)
BUN: 7 mg/dL — ABNORMAL LOW (ref 8–23)
CO2: 20 mmol/L — ABNORMAL LOW (ref 22–32)
Calcium: 10 mg/dL (ref 8.9–10.3)
Chloride: 96 mmol/L — ABNORMAL LOW (ref 98–111)
Creatinine: 0.61 mg/dL (ref 0.44–1.00)
GFR, Estimated: >60 mL/min (ref >60)
Glucose, Bld: 209 mg/dL — ABNORMAL HIGH (ref 70–99)
Potassium: 3.9 mmol/L (ref 3.5–5.1)
Sodium: 132 mmol/L — ABNORMAL LOW (ref 135–145)
Total Bilirubin: 0.5 mg/dL (ref 0.0–1.2)
Total Protein: 8 g/dL (ref 6.5–8.1)

## 2023-06-15 MED ORDER — SODIUM CHLORIDE 0.9 % IV SOLN: Freq: Once | INTRAVENOUS | Status: AC

## 2023-06-15 MED ORDER — DEXAMETHASONE SODIUM PHOSPHATE 10 MG/ML IJ SOLN
10.0000 mg | Freq: Once | INTRAMUSCULAR | Status: AC
  Administered 2023-06-15: 10 mg via INTRAVENOUS
  Filled 2023-06-15: qty 1

## 2023-06-15 MED ORDER — HEPARIN SOD (PORK) LOCK FLUSH 100 UNIT/ML IV SOLN
500.0000 [IU] | Freq: Once | INTRAVENOUS | Status: AC | PRN
  Administered 2023-06-15: 500 [IU]

## 2023-06-15 MED ORDER — ONDANSETRON HCL 4 MG/2ML IJ SOLN
8.0000 mg | Freq: Once | INTRAMUSCULAR | Status: AC
Start: 2023-06-15 — End: 2023-06-15
  Administered 2023-06-15: 8 mg via INTRAVENOUS
  Filled 2023-06-15: qty 4

## 2023-06-15 MED ORDER — SODIUM CHLORIDE 0.9% FLUSH
10.0000 mL | Freq: Once | INTRAVENOUS | Status: AC | PRN
  Administered 2023-06-15: 10 mL

## 2023-06-15 NOTE — Patient Instructions (Signed)
Nausea, Adult Nausea is feeling like you may vomit. Feeling like you may vomit is usually not serious, but it may be an early sign of a more serious medical problem. Vomiting is when stomach contents forcefully come out of your mouth. If you vomit, or if you are not able to drink enough fluids, you may not have enough water in your body (get dehydrated). If you do not have enough water in your body, you may: Feel tired. Feel thirsty. Have a dry mouth. Have cracked lips. Pee (urinate) less often. Older adults and people who have other diseases or a weak body defense system (immune system) have a higher risk of not having enough water in the body. The main goals of treating this condition are: To relieve your nausea. To ensure your nausea occurs less often. To prevent vomiting and losing too much fluid. Follow these instructions at home: Watch your symptoms for any changes. Tell your doctor about them. Eating and drinking     Take an ORS (oral rehydration solution). This is a drink that is sold at pharmacies and stores. Drink clear fluids in small amounts as you are able. These include: Water. Ice chips. Fruit juice that has water added (diluted fruit juice). Low-calorie sports drinks. Eat bland, easy-to-digest foods in small amounts as you are able, such as: Bananas. Applesauce. Rice. Low-fat (lean) meats. Toast. Crackers. Avoid drinking fluids that have a lot of sugar or caffeine in them. This includes energy drinks, sports drinks, and soda. Avoid alcohol. Avoid spicy or fatty foods. General instructions Take over-the-counter and prescription medicines only as told by your doctor. Rest at home while you get better. Drink enough fluid to keep your pee (urine) pale yellow. Take slow and deep breaths when you feel like you may vomit. Avoid food or things that have strong smells. Wash your hands often with soap and water for at least 20 seconds. If you cannot use soap and water,  use hand sanitizer. Make sure that everyone in your home washes their hands well and often. Keep all follow-up visits. Contact a doctor if: You feel worse. You feel like you may vomit and this lasts for more than 2 days. You vomit. You are not able to drink fluids without vomiting. You have new symptoms. You have a fever. You have a headache. You have muscle cramps. You have a rash. You have pain while peeing. You feel light-headed or dizzy. Get help right away if: You have pain in your chest, neck, arm, or jaw. You feel very weak or you faint. You have vomit that is bright red or looks like coffee grounds. You have bloody or black poop (stools) or poop that looks like tar. You have a very bad headache, a stiff neck, or both. You have very bad pain, cramping, or bloating in your belly (abdomen). You have trouble breathing or you are breathing very quickly. Your heart is beating very quickly. Your skin feels cold and clammy. You feel confused. You have signs of losing too much water in your body, such as: Dark pee, very little pee, or no pee. Cracked lips. Dry mouth. Sunken eyes. Sleepiness. Weakness. These symptoms may be an emergency. Get help right away. Call 911. Do not wait to see if the symptoms will go away. Do not drive yourself to the hospital. Summary Nausea is feeling like you are about vomit. If you vomit, or if you are not able to drink enough fluids, you may not have enough water in   your body (get dehydrated). Eat and drink what your doctor tells you. Take over-the-counter and prescription medicines only as told by your doctor. Contact a doctor right away if your symptoms get worse or you have new symptoms. Keep all follow-up visits. This information is not intended to replace advice given to you by your health care provider. Make sure you discuss any questions you have with your health care provider. Document Revised: 09/19/2020 Document Reviewed:  09/19/2020 Elsevier Patient Education  2024 Elsevier Inc.  

## 2023-06-16 ENCOUNTER — Inpatient Hospital Stay

## 2023-06-16 VITALS — BP 128/85 | HR 96 | Temp 97.9°F | Resp 20 | Ht 72.0 in | Wt 257.8 lb

## 2023-06-16 DIAGNOSIS — Z5111 Encounter for antineoplastic chemotherapy: Secondary | ICD-10-CM | POA: Diagnosis not present

## 2023-06-16 DIAGNOSIS — C3401 Malignant neoplasm of right main bronchus: Secondary | ICD-10-CM

## 2023-06-16 MED ORDER — HEPARIN SOD (PORK) LOCK FLUSH 100 UNIT/ML IV SOLN
500.0000 [IU] | Freq: Once | INTRAVENOUS | Status: AC | PRN
  Administered 2023-06-16: 500 [IU]

## 2023-06-16 MED ORDER — SODIUM CHLORIDE 0.9 % IV SOLN
150.0000 mg | Freq: Once | INTRAVENOUS | Status: AC
  Administered 2023-06-16: 150 mg via INTRAVENOUS
  Filled 2023-06-16: qty 150

## 2023-06-16 MED ORDER — DIPHENHYDRAMINE HCL 50 MG/ML IJ SOLN
50.0000 mg | Freq: Once | INTRAMUSCULAR | Status: AC
  Administered 2023-06-16: 50 mg via INTRAVENOUS
  Filled 2023-06-16: qty 1

## 2023-06-16 MED ORDER — SODIUM CHLORIDE 0.9% FLUSH
10.0000 mL | INTRAVENOUS | Status: DC | PRN
  Administered 2023-06-16: 10 mL

## 2023-06-16 MED ORDER — DEXAMETHASONE SODIUM PHOSPHATE 10 MG/ML IJ SOLN
10.0000 mg | Freq: Once | INTRAMUSCULAR | Status: AC
  Administered 2023-06-16: 10 mg via INTRAVENOUS
  Filled 2023-06-16: qty 1

## 2023-06-16 MED ORDER — PALONOSETRON HCL INJECTION 0.25 MG/5ML
0.2500 mg | Freq: Once | INTRAVENOUS | Status: AC
  Administered 2023-06-16: 0.25 mg via INTRAVENOUS
  Filled 2023-06-16: qty 5

## 2023-06-16 MED ORDER — SODIUM CHLORIDE 0.9 % IV SOLN
600.0000 mg | Freq: Once | INTRAVENOUS | Status: AC
  Administered 2023-06-16: 600 mg via INTRAVENOUS
  Filled 2023-06-16: qty 60

## 2023-06-16 MED ORDER — FAMOTIDINE IN NACL 20-0.9 MG/50ML-% IV SOLN
20.0000 mg | Freq: Once | INTRAVENOUS | Status: AC
  Administered 2023-06-16: 20 mg via INTRAVENOUS
  Filled 2023-06-16: qty 50

## 2023-06-16 MED ORDER — SODIUM CHLORIDE 0.9 % IV SOLN: INTRAVENOUS | Status: DC

## 2023-06-16 MED ORDER — SODIUM CHLORIDE 0.9 % IV SOLN
145.0000 mg/m2 | Freq: Once | INTRAVENOUS | Status: AC
  Administered 2023-06-16: 354 mg via INTRAVENOUS
  Filled 2023-06-16: qty 59

## 2023-06-16 NOTE — Progress Notes (Signed)
 Per Dr. Melvyn Neth ok to proceed with D1C2 Paclitaxel/Carboplatin with CTA results.

## 2023-06-16 NOTE — Patient Instructions (Signed)
 [facility] [place] - A DEPT OF [facility]  Discharge Instructions: Thank you for choosing [facility] to provide your oncology and hematology care.  If you have a lab appointment with the Cancer Center, please go directly to the Cancer Center and check in at the registration area.   Wear comfortable clothing and clothing appropriate for easy access to any Portacath or PICC line.   We strive to give you quality time with your provider. You may need to reschedule your appointment if you arrive late (15 or more minutes).  Arriving late affects you and other patients whose appointments are after yours.  Also, if you miss three or more appointments without notifying the office, you may be dismissed from the clinic at the provider's discretion.      For prescription refill requests, have your pharmacy contact our office and allow 72 hours for refills to be completed.    Today you received the following chemotherapy and/or immunotherapy agents Paclitaxel & Carboplatin      To help prevent nausea and vomiting after your treatment, we encourage you to take your nausea medication as directed.  BELOW ARE SYMPTOMS THAT SHOULD BE REPORTED IMMEDIATELY: *FEVER GREATER THAN 100.4 F (38 C) OR HIGHER *CHILLS OR SWEATING *NAUSEA AND VOMITING THAT IS NOT CONTROLLED WITH YOUR NAUSEA MEDICATION *UNUSUAL SHORTNESS OF BREATH *UNUSUAL BRUISING OR BLEEDING *URINARY PROBLEMS (pain or burning when urinating, or frequent urination) *BOWEL PROBLEMS (unusual diarrhea, constipation, pain near the anus) TENDERNESS IN MOUTH AND THROAT WITH OR WITHOUT PRESENCE OF ULCERS (sore throat, sores in mouth, or a toothache) UNUSUAL RASH, SWELLING OR PAIN  UNUSUAL VAGINAL DISCHARGE OR ITCHING   Items with * indicate a potential emergency and should be followed up as soon as possible or go to the Emergency Department if any problems should occur.  Please show the CHEMOTHERAPY ALERT CARD or  IMMUNOTHERAPY ALERT CARD at check-in to the Emergency Department and triage nurse.  Should you have questions after your visit or need to cancel or reschedule your appointment, please contact [facility] [place] - A DEPT OF [facility]  Dept: [phone]  and follow the prompts.  Office hours are 8:00 a.m. to 4:30 p.m. Monday - Friday. Please note that voicemails left after 4:00 p.m. may not be returned until the following business day.  We are closed weekends and major holidays. You have access to a nurse at all times for urgent questions. Please call the main number to the clinic Dept: [phone] and follow the prompts.  For any non-urgent questions, you may also contact your provider using MyChart. We now offer e-Visits for anyone 48 and older to request care online for non-urgent symptoms. For details visit [email or web address].   Also download the MyChart app! Go to the app store, search "MyChart", open the app, select , and log in with your MyChart username and password.

## 2023-06-17 ENCOUNTER — Inpatient Hospital Stay

## 2023-06-17 VITALS — BP 115/82 | HR 98 | Temp 98.1°F | Resp 20

## 2023-06-17 DIAGNOSIS — Z5111 Encounter for antineoplastic chemotherapy: Secondary | ICD-10-CM | POA: Diagnosis not present

## 2023-06-17 DIAGNOSIS — C3401 Malignant neoplasm of right main bronchus: Secondary | ICD-10-CM

## 2023-06-17 MED ORDER — PEGFILGRASTIM-CBQV 6 MG/0.6ML ~~LOC~~ SOSY
6.0000 mg | PREFILLED_SYRINGE | Freq: Once | SUBCUTANEOUS | Status: AC
  Administered 2023-06-17: 6 mg via SUBCUTANEOUS
  Filled 2023-06-17: qty 0.6

## 2023-06-17 NOTE — Patient Instructions (Signed)

## 2023-06-20 ENCOUNTER — Other Ambulatory Visit: Payer: Self-pay

## 2023-06-20 ENCOUNTER — Ambulatory Visit

## 2023-06-22 ENCOUNTER — Other Ambulatory Visit: Payer: Self-pay | Admitting: Oncology

## 2023-06-22 DIAGNOSIS — E876 Hypokalemia: Secondary | ICD-10-CM

## 2023-06-23 ENCOUNTER — Other Ambulatory Visit: Payer: Self-pay

## 2023-06-24 ENCOUNTER — Telehealth: Payer: Self-pay | Admitting: Pharmacist

## 2023-06-24 ENCOUNTER — Other Ambulatory Visit: Payer: Self-pay | Admitting: Oncology

## 2023-06-24 ENCOUNTER — Encounter: Payer: Self-pay | Admitting: Oncology

## 2023-06-24 ENCOUNTER — Inpatient Hospital Stay

## 2023-06-24 ENCOUNTER — Telehealth: Payer: Self-pay

## 2023-06-24 ENCOUNTER — Other Ambulatory Visit: Payer: Self-pay

## 2023-06-24 ENCOUNTER — Ambulatory Visit (HOSPITAL_BASED_OUTPATIENT_CLINIC_OR_DEPARTMENT_OTHER)
Admission: RE | Admit: 2023-06-24 | Discharge: 2023-06-24 | Disposition: A | Discharge status: Home / Self Care | Source: Ambulatory Visit | Attending: Oncology | Admitting: Oncology

## 2023-06-24 ENCOUNTER — Other Ambulatory Visit (HOSPITAL_BASED_OUTPATIENT_CLINIC_OR_DEPARTMENT_OTHER): Payer: Self-pay

## 2023-06-24 ENCOUNTER — Other Ambulatory Visit: Payer: Self-pay | Admitting: Pharmacist

## 2023-06-24 VITALS — BP 140/88 | HR 118 | Temp 98.9°F | Resp 20

## 2023-06-24 VITALS — Ht 72.0 in | Wt 254.2 lb

## 2023-06-24 DIAGNOSIS — R112 Nausea with vomiting, unspecified: Secondary | ICD-10-CM

## 2023-06-24 DIAGNOSIS — Z5111 Encounter for antineoplastic chemotherapy: Secondary | ICD-10-CM | POA: Diagnosis not present

## 2023-06-24 DIAGNOSIS — C3401 Malignant neoplasm of right main bronchus: Secondary | ICD-10-CM

## 2023-06-24 DIAGNOSIS — Z79899 Other long term (current) drug therapy: Secondary | ICD-10-CM

## 2023-06-24 DIAGNOSIS — R0602 Shortness of breath: Secondary | ICD-10-CM

## 2023-06-24 DIAGNOSIS — R110 Nausea: Secondary | ICD-10-CM

## 2023-06-24 LAB — URINALYSIS, COMPLETE (UACMP) WITH MICROSCOPIC
Bilirubin Urine: NEGATIVE
Glucose, UA: NEGATIVE mg/dL
Hgb urine dipstick: NEGATIVE
Ketones, ur: NEGATIVE mg/dL
Nitrite: POSITIVE — AB
Protein, ur: NEGATIVE mg/dL
Specific Gravity, Urine: 1.005 (ref 1.005–1.030)
pH: 6 (ref 5.0–8.0)

## 2023-06-24 LAB — CBC WITH DIFFERENTIAL (CANCER CENTER ONLY)
Abs Immature Granulocytes: 1.04 10*3/uL — ABNORMAL HIGH (ref 0.00–0.07)
Basophils Absolute: 0.1 10*3/uL (ref 0.0–0.1)
Basophils Relative: 1 %
Eosinophils Absolute: 0.2 10*3/uL (ref 0.0–0.5)
Eosinophils Relative: 2 %
HCT: 27.6 % — ABNORMAL LOW (ref 36.0–46.0)
Hemoglobin: 9.1 g/dL — ABNORMAL LOW (ref 12.0–15.0)
Immature Granulocytes: 10 %
Lymphocytes Relative: 16 %
Lymphs Abs: 1.7 10*3/uL (ref 0.7–4.0)
MCH: 29.4 pg (ref 26.0–34.0)
MCHC: 33 g/dL (ref 30.0–36.0)
MCV: 89.3 fL (ref 80.0–100.0)
Monocytes Absolute: 1.5 10*3/uL — ABNORMAL HIGH (ref 0.1–1.0)
Monocytes Relative: 14 %
Neutro Abs: 5.9 10*3/uL (ref 1.7–7.7)
Neutrophils Relative %: 57 %
Platelet Count: 258 10*3/uL (ref 150–400)
RBC: 3.09 MIL/uL — ABNORMAL LOW (ref 3.87–5.11)
RDW: 14.2 % (ref 11.5–15.5)
Smear Review: NORMAL
WBC Count: 10.4 10*3/uL (ref 4.0–10.5)
nRBC: 0 /100{WBCs}
nRBC: 0.03 % (ref 0.0–0.2)

## 2023-06-24 LAB — CMP (CANCER CENTER ONLY)
ALT: 26 U/L (ref 0–44)
AST: 23 U/L (ref 15–41)
Albumin: 3.8 g/dL (ref 3.5–5.0)
Alkaline Phosphatase: 167 U/L — ABNORMAL HIGH (ref 38–126)
Anion gap: 16 — ABNORMAL HIGH (ref 5–15)
BUN: 7 mg/dL — ABNORMAL LOW (ref 8–23)
CO2: 20 mmol/L — ABNORMAL LOW (ref 22–32)
Calcium: 9.9 mg/dL (ref 8.9–10.3)
Chloride: 95 mmol/L — ABNORMAL LOW (ref 98–111)
Creatinine: 0.66 mg/dL (ref 0.44–1.00)
GFR, Estimated: >60 mL/min (ref >60)
Glucose, Bld: 154 mg/dL — ABNORMAL HIGH (ref 70–99)
Potassium: 3.7 mmol/L (ref 3.5–5.1)
Sodium: 131 mmol/L — ABNORMAL LOW (ref 135–145)
Total Bilirubin: 0.5 mg/dL (ref 0.0–1.2)
Total Protein: 7.7 g/dL (ref 6.5–8.1)

## 2023-06-24 LAB — MAGNESIUM: Magnesium: 0.7 mg/dL — CL (ref 1.7–2.4)

## 2023-06-24 MED ORDER — ONDANSETRON HCL 4 MG/2ML IJ SOLN
8.0000 mg | Freq: Once | INTRAMUSCULAR | Status: AC
  Administered 2023-06-24: 8 mg via INTRAVENOUS

## 2023-06-24 MED ORDER — LEVOFLOXACIN 750 MG PO TABS
750.0000 mg | ORAL_TABLET | Freq: Every day | ORAL | 0 refills | Status: DC
  Filled 2023-06-24: qty 5, 5d supply, fill #0

## 2023-06-24 MED ORDER — SODIUM CHLORIDE 0.9 % IV SOLN
2.0000 g | INTRAVENOUS | Status: DC
  Administered 2023-06-24: 2 g via INTRAVENOUS
  Filled 2023-06-24: qty 20

## 2023-06-24 MED ORDER — AZITHROMYCIN 250 MG PO TABS
ORAL_TABLET | ORAL | 0 refills | Status: AC
  Filled 2023-06-24: qty 6, 5d supply, fill #0

## 2023-06-24 MED ORDER — SODIUM CHLORIDE 0.9 % IV SOLN: Freq: Once | INTRAVENOUS | Status: AC

## 2023-06-24 MED ORDER — SODIUM CHLORIDE 0.9 % IV SOLN: Freq: Once | INTRAVENOUS | Status: DC

## 2023-06-24 MED ORDER — MAGNESIUM OXIDE -MG SUPPLEMENT 400 (240 MG) MG PO TABS: 400.0000 mg | ORAL_TABLET | Freq: Two times a day (BID) | ORAL | 0 refills | Status: AC

## 2023-06-24 NOTE — Patient Instructions (Signed)
Fever, Adult     A fever is a high body temperature that is 100.31F (38C) or higher. Brief mild or moderate fevers generally have no lasting effects, and they often do not need treatment. Moderate or high fevers can feel uncomfortable and can sometimes be a sign of a serious problem. Fevers can also cause dehydration because the body may sweat, especially if the fever keeps coming back or lasts a long time. You can use a thermometer to check for a fever. Body temperature can change with: Age. Time of day. Where the temperature is taken, such as in the mouth, rectum, ear, under the arm, or on the forehead. Follow these instructions at home: Medicines Take over-the-counter and prescription medicines only as told by your health care provider. Follow instructions on how much medicine to take and how often. If you were prescribed antibiotics, take them as told by your provider. Do not stop using the antibiotic even if you start to feel better. General instructions Watch for any changes in your symptoms. Let your provider know about them. Rest as needed. Drink enough fluid to keep your pee (urine) pale yellow. This helps to prevent dehydration. Bathe or sponge bathe with room-temperature water to help lower your body temperature as needed. Do not use cold water. Do not use too many blankets or wear heavy clothes. Stay home from work and public places for at least 24 hours after your fever is gone. Your fever should be gone without having to use medicines. Contact a health care provider if: You have vomiting or diarrhea that does not get better. You cannot eat or drink without vomiting. You have pain when you pee (urinate). Your symptoms do not get better with treatment or you have new symptoms. You have a skin rash. You have signs of dehydration, such as: Dark pee, very little pee, or no pee. Cracked lips or dry mouth. Sunken eyes. Sleepiness. Weakness. Get help right away if: You have  shortness of breath or trouble breathing. You feel dizzy or you faint. You are confused and do not know the time of day, where you are, or who you are (disoriented). You have severe pain in your abdomen. These symptoms may be an emergency. Get help right away. Call 911. Do not wait to see if the symptoms will go away. Do not drive yourself to the hospital. This information is not intended to replace advice given to you by your health care provider. Make sure you discuss any questions you have with your health care provider. Document Revised: 12/15/2021 Document Reviewed: 12/15/2021 Elsevier Patient Education  2024 [employer].

## 2023-06-24 NOTE — Telephone Encounter (Signed)
 I called Susan Franco to notify her of her low magnesium level.  Her family agrees to go to our retail pharmacy and pick up a bottle of magnesium oxide.  I told her to take this twice daily and to separate it from administration of levaquin by at least 4 hours.  We plan to repeat the magnesium level at her next visit.  She verbalized understanding and agreed with the plan.

## 2023-06-24 NOTE — Telephone Encounter (Signed)
 Pt feeling rotten this morning. She was having chills last night. However this morning, she has Temp 100.4, chills, and vomiting. Last chemo 06/16/2023. I sent above message to Dr Melvyn Neth and Darl Pikes [facility] for recommendations.  Dr Melvyn Neth recommends pt come in for triage. Pt given appt for 11am.

## 2023-06-27 ENCOUNTER — Telehealth: Payer: Self-pay

## 2023-06-27 NOTE — Telephone Encounter (Signed)
 I spoke with pt to see how she is feeling. She reports that yesterday she was still having some difficulty breathing. "Today, I'm breathing better. I think the antibiotic kicked in". I confirmed she is taking the magnesium 2 tabs po BID, and 4 hrs away from Levaquin as instructed. She is having a little constipation, so she will start senna-a 1 tab po BID. She is worried about next treatment. "I told my sister, I don't know if I can do it again". I told her that Dr Melvyn Neth could possibly decrease the percentage of chemo she is given, so she could better tolerate treatment. I told her it wasn't a promise of course, but she was excited to even hear of something we could do differently. Afebrile.

## 2023-06-28 ENCOUNTER — Encounter: Payer: Self-pay | Admitting: Oncology

## 2023-06-29 ENCOUNTER — Encounter: Payer: Self-pay | Admitting: Oncology

## 2023-06-29 LAB — CULTURE, BLOOD (SINGLE)
Culture: NO GROWTH
Culture: NO GROWTH

## 2023-07-06 NOTE — Progress Notes (Unsigned)
 [facility]  [address] [place],  [place]  [address] [phone]  Clinic Day:  07/06/2023  Referring physician: Nonnie Done., MD   HISTORY OF PRESENT ILLNESS:  The patient is a 63 y.o. female with stage IVB (T2a N2a M1c1) squamous cell lung cancer, which includes spread of disease to her liver.  She comes in today to be evaluated before heading into her 3rd cycle of carboplatin/paclitaxel.  The patient claims to have tolerated her 2nd cycle of chemotherapy fairly well.  However, over the past week, she has had increased nausea and vomiting.  This did not happen with her first 2 weeks of treatment.  Furthermore, she has gotten progressively short of breath to where she can barely walk a few feet before becoming extremely dyspneic.  The patient does not believe her chemotherapy is factoring into her current symptoms.  She did undergo Guard[employer] testing of her biopsied lung tissue, for which no mutations/alterations were seen.  She completed palliative radiation to her lung mass, which was given due to it causing hemoptysis.  Her hemoptysis has dissipated after her radiation.  Her shortness of breath had also improved before it came problematic again approximately 1 week ago.  PHYSICAL EXAM:  Last menstrual period 11/15/2011. Wt Readings from Last 3 Encounters:  06/24/23 254 lb 4 oz (115.3 kg)  06/16/23 257 lb 12 oz (116.9 kg)  05/26/23 256 lb (116.1 kg)   There is no height or weight on file to calculate BMI. Performance status (ECOG): 2 - Symptomatic, <50% confined to bed Physical Exam Constitutional:      Appearance: Normal appearance. She is not ill-appearing.     Comments: A pleasant woman in moderate distress.  She is very nauseated.  HENT:     Mouth/Throat:     Mouth: Mucous membranes are moist.     Pharynx: Oropharynx is clear. No oropharyngeal exudate or posterior oropharyngeal erythema.  Cardiovascular:     Rate and Rhythm: Regular rhythm.  Tachycardia present.     Heart sounds: No murmur heard.    No friction rub. No gallop.  Pulmonary:     Effort: Pulmonary effort is normal. No respiratory distress.     Breath sounds: Normal breath sounds. No wheezing, rhonchi or rales.  Abdominal:     General: Bowel sounds are normal. There is no distension.     Palpations: Abdomen is soft. There is no mass.     Tenderness: There is no abdominal tenderness.  Musculoskeletal:        General: No swelling.     Right lower leg: No edema.     Left lower leg: No edema.  Lymphadenopathy:     Cervical: No cervical adenopathy.     Upper Body:     Right upper body: No supraclavicular or axillary adenopathy.     Left upper body: No supraclavicular or axillary adenopathy.     Lower Body: No right inguinal adenopathy. No left inguinal adenopathy.  Skin:    General: Skin is warm.     Coloration: Skin is not jaundiced.     Findings: No lesion or rash.  Neurological:     General: No focal deficit present.     Mental Status: She is alert and oriented to person, place, and time. Mental status is at baseline.  Psychiatric:        Mood and Affect: Mood normal.        Behavior: Behavior normal.  Thought Content: Thought content normal.   LABS:      Latest Ref Rng & Units 06/24/2023   10:54 AM 06/15/2023    9:51 AM 05/26/2023    9:04 AM  CBC  WBC 4.0 - 10.5 K/uL 10.4  10.6  9.7   Hemoglobin 12.0 - 15.0 g/dL 9.1  9.8  63.0   Hematocrit 36.0 - 46.0 % 27.6  29.3  33.8   Platelets 150 - 400 K/uL 258  407  374       Latest Ref Rng & Units 06/24/2023   10:54 AM 06/15/2023    9:51 AM 05/26/2023    9:04 AM  CMP  Glucose 70 - 99 mg/dL 160  109  323   BUN 8 - 23 mg/dL 7  7  7    Creatinine 0.44 - 1.00 mg/dL 5.57  3.22  0.25   Sodium 135 - 145 mmol/L 131  132  134   Potassium 3.5 - 5.1 mmol/L 3.7  3.9  3.1   Chloride 98 - 111 mmol/L 95  96  96   CO2 22 - 32 mmol/L 20  20  26    Calcium 8.9 - 10.3 mg/dL 9.9  42.7  06.2   Total Protein 6.5 -  8.1 g/dL 7.7  8.0  7.4   Total Bilirubin 0.0 - 1.2 mg/dL 0.5  0.5  0.4   Alkaline Phos 38 - 126 U/L 167  140  104   AST 15 - 41 U/L 23  19  16    ALT 0 - 44 U/L 26  13  22      ASSESSMENT & PLAN:  Assessment/Plan:  A 63 y.o. female with stage IVB (T2a N2a M1c1) squamous cell lung cancer, which includes spread of disease to her liver.  In clinic today, the patient was in fairly significant discomfort.  Although her oxygen level did not particularly fall when walking, she became much more dyspneic upon ambulation.  Furthermore, she had resting tachycardia.  Based upon these findings, my concern is this patient may have a pulmonary embolus.  I will send her for a stat CT angiogram today.  With respect to her nausea, Decadron 10 mg and Zofran 8 mg IV will both be given to ameliorate this problem.  I will notify the patient of her CT angiogram results as soon as they become available.  If she is better tomorrow, she will proceed with her second cycle of carboplatin/paclitaxel at that time.  The patient understands all the plans discussed today and is in agreement with them.    Davianna Deutschman Kirby Funk, MD

## 2023-07-07 ENCOUNTER — Inpatient Hospital Stay: Attending: Oncology

## 2023-07-07 ENCOUNTER — Telehealth: Payer: Self-pay | Admitting: Oncology

## 2023-07-07 ENCOUNTER — Other Ambulatory Visit: Payer: Self-pay | Admitting: Oncology

## 2023-07-07 ENCOUNTER — Inpatient Hospital Stay

## 2023-07-07 ENCOUNTER — Inpatient Hospital Stay (HOSPITAL_BASED_OUTPATIENT_CLINIC_OR_DEPARTMENT_OTHER): Admitting: Oncology

## 2023-07-07 ENCOUNTER — Encounter: Payer: Self-pay | Admitting: Oncology

## 2023-07-07 VITALS — BP 115/84 | HR 91 | Resp 18

## 2023-07-07 VITALS — BP 82/54 | HR 86 | Temp 98.2°F | Resp 16 | Ht 72.0 in | Wt 254.3 lb

## 2023-07-07 DIAGNOSIS — C787 Secondary malignant neoplasm of liver and intrahepatic bile duct: Secondary | ICD-10-CM | POA: Diagnosis not present

## 2023-07-07 DIAGNOSIS — J9811 Atelectasis: Secondary | ICD-10-CM | POA: Insufficient documentation

## 2023-07-07 DIAGNOSIS — C3401 Malignant neoplasm of right main bronchus: Secondary | ICD-10-CM | POA: Diagnosis not present

## 2023-07-07 DIAGNOSIS — Z5189 Encounter for other specified aftercare: Secondary | ICD-10-CM | POA: Insufficient documentation

## 2023-07-07 DIAGNOSIS — Z5111 Encounter for antineoplastic chemotherapy: Secondary | ICD-10-CM | POA: Insufficient documentation

## 2023-07-07 DIAGNOSIS — R21 Rash and other nonspecific skin eruption: Secondary | ICD-10-CM

## 2023-07-07 LAB — CBC WITH DIFFERENTIAL (CANCER CENTER ONLY)
Abs Immature Granulocytes: 0.1 10*3/uL — ABNORMAL HIGH (ref 0.00–0.07)
Basophils Absolute: 0 10*3/uL (ref 0.0–0.1)
Basophils Relative: 0 %
Eosinophils Absolute: 0 10*3/uL (ref 0.0–0.5)
Eosinophils Relative: 0 %
HCT: 27.5 % — ABNORMAL LOW (ref 36.0–46.0)
Hemoglobin: 8.9 g/dL — ABNORMAL LOW (ref 12.0–15.0)
Immature Granulocytes: 1 %
Lymphocytes Relative: 9 %
Lymphs Abs: 0.8 10*3/uL (ref 0.7–4.0)
MCH: 29.1 pg (ref 26.0–34.0)
MCHC: 32.4 g/dL (ref 30.0–36.0)
MCV: 89.9 fL (ref 80.0–100.0)
Monocytes Absolute: 0.1 10*3/uL (ref 0.1–1.0)
Monocytes Relative: 1 %
Neutro Abs: 7.6 10*3/uL (ref 1.7–7.7)
Neutrophils Relative %: 89 %
Platelet Count: 434 10*3/uL — ABNORMAL HIGH (ref 150–400)
RBC: 3.06 MIL/uL — ABNORMAL LOW (ref 3.87–5.11)
RDW: 14.8 % (ref 11.5–15.5)
WBC Count: 8.6 10*3/uL (ref 4.0–10.5)
nRBC: 0 % (ref 0.0–0.2)
nRBC: 0 /100{WBCs}

## 2023-07-07 LAB — CMP (CANCER CENTER ONLY)
ALT: 8 U/L (ref 0–44)
AST: 16 U/L (ref 15–41)
Albumin: 3.8 g/dL (ref 3.5–5.0)
Alkaline Phosphatase: 138 U/L — ABNORMAL HIGH (ref 38–126)
Anion gap: 13 (ref 5–15)
BUN: 9 mg/dL (ref 8–23)
CO2: 23 mmol/L (ref 22–32)
Calcium: 10.7 mg/dL — ABNORMAL HIGH (ref 8.9–10.3)
Chloride: 96 mmol/L — ABNORMAL LOW (ref 98–111)
Creatinine: 0.66 mg/dL (ref 0.44–1.00)
GFR, Estimated: >60 mL/min (ref >60)
Glucose, Bld: 221 mg/dL — ABNORMAL HIGH (ref 70–99)
Potassium: 4 mmol/L (ref 3.5–5.1)
Sodium: 131 mmol/L — ABNORMAL LOW (ref 135–145)
Total Bilirubin: 0.3 mg/dL (ref 0.0–1.2)
Total Protein: 8.3 g/dL — ABNORMAL HIGH (ref 6.5–8.1)

## 2023-07-07 LAB — MAGNESIUM: Magnesium: 1.6 mg/dL — ABNORMAL LOW (ref 1.7–2.4)

## 2023-07-07 MED ORDER — SODIUM CHLORIDE 0.9 % IV SOLN
150.0000 mg | Freq: Once | INTRAVENOUS | Status: AC
  Administered 2023-07-07: 150 mg via INTRAVENOUS
  Filled 2023-07-07: qty 150

## 2023-07-07 MED ORDER — SODIUM CHLORIDE 0.9 % IV SOLN: INTRAVENOUS | Status: DC

## 2023-07-07 MED ORDER — PACLITAXEL CHEMO INJECTION 300 MG/50ML
116.0000 mg/m2 | Freq: Once | INTRAVENOUS | Status: AC
  Administered 2023-07-07: 282 mg via INTRAVENOUS
  Filled 2023-07-07: qty 47

## 2023-07-07 MED ORDER — SODIUM CHLORIDE 0.9 % IV SOLN
480.0000 mg | Freq: Once | INTRAVENOUS | Status: AC
  Administered 2023-07-07: 480 mg via INTRAVENOUS
  Filled 2023-07-07: qty 48

## 2023-07-07 MED ORDER — DEXAMETHASONE SODIUM PHOSPHATE 10 MG/ML IJ SOLN
10.0000 mg | Freq: Once | INTRAMUSCULAR | Status: AC
  Administered 2023-07-07: 10 mg via INTRAVENOUS
  Filled 2023-07-07: qty 1

## 2023-07-07 MED ORDER — DIPHENHYDRAMINE HCL 50 MG/ML IJ SOLN
50.0000 mg | Freq: Once | INTRAMUSCULAR | Status: AC
Start: 2023-07-07 — End: 2023-07-07
  Administered 2023-07-07: 50 mg via INTRAVENOUS
  Filled 2023-07-07: qty 1

## 2023-07-07 MED ORDER — FAMOTIDINE IN NACL 20-0.9 MG/50ML-% IV SOLN
20.0000 mg | Freq: Once | INTRAVENOUS | Status: AC
Start: 2023-07-07 — End: 2023-07-07
  Administered 2023-07-07: 20 mg via INTRAVENOUS
  Filled 2023-07-07: qty 50

## 2023-07-07 MED ORDER — PALONOSETRON HCL INJECTION 0.25 MG/5ML
0.2500 mg | Freq: Once | INTRAVENOUS | Status: AC
  Administered 2023-07-07: 0.25 mg via INTRAVENOUS
  Filled 2023-07-07: qty 5

## 2023-07-07 MED ORDER — SODIUM CHLORIDE 0.9% FLUSH
10.0000 mL | INTRAVENOUS | Status: DC | PRN
  Administered 2023-07-07: 10 mL

## 2023-07-07 MED ORDER — HEPARIN SOD (PORK) LOCK FLUSH 100 UNIT/ML IV SOLN
500.0000 [IU] | Freq: Once | INTRAVENOUS | Status: AC | PRN
  Administered 2023-07-07: 500 [IU]

## 2023-07-07 NOTE — Progress Notes (Signed)
 Decrease doses of chemotherapy by an additional 20% per Dr. Melvyn Neth due to poor tolerance.

## 2023-07-07 NOTE — Patient Instructions (Signed)
Carboplatin Injection What is this medication? CARBOPLATIN (KAR boe pla tin) treats some types of cancer. It works by slowing down the growth of cancer cells. This medicine may be used for other purposes; ask your health care provider or pharmacist if you have questions. COMMON BRAND NAME(S): Paraplatin What should I tell my care team before I take this medication? They need to know if you have any of these conditions: Blood disorders Hearing problems Kidney disease Recent or ongoing radiation therapy An unusual or allergic reaction to carboplatin, cisplatin, other medications, foods, dyes, or preservatives Pregnant or trying to get pregnant Breast-feeding How should I use this medication? This medication is injected into a vein. It is given by your care team in a hospital or clinic setting. Talk to your care team about the use of this medication in children. Special care may be needed. Overdosage: If you think you have taken too much of this medicine contact a poison control center or emergency room at once. NOTE: This medicine is only for you. Do not share this medicine with others. What if I miss a dose? Keep appointments for follow-up doses. It is important not to miss your dose. Call your care team if you are unable to keep an appointment. What may interact with this medication? Medications for seizures Some antibiotics, such as amikacin, gentamicin, neomycin, streptomycin, tobramycin Vaccines This list may not describe all possible interactions. Give your health care provider a list of all the medicines, herbs, non-prescription drugs, or dietary supplements you use. Also tell them if you smoke, drink alcohol, or use illegal drugs. Some items may interact with your medicine. What should I watch for while using this medication? Your condition will be monitored carefully while you are receiving this medication. You may need blood work while taking this medication. This medication may  make you feel generally unwell. This is not uncommon, as chemotherapy can affect healthy cells as well as cancer cells. Report any side effects. Continue your course of treatment even though you feel ill unless your care team tells you to stop. In some cases, you may be given additional medications to help with side effects. Follow all directions for their use. This medication may increase your risk of getting an infection. Call your care team for advice if you get a fever, chills, sore throat, or other symptoms of a cold or flu. Do not treat yourself. Try to avoid being around people who are sick. Avoid taking medications that contain aspirin, acetaminophen, ibuprofen, naproxen, or ketoprofen unless instructed by your care team. These medications may hide a fever. Be careful brushing or flossing your teeth or using a toothpick because you may get an infection or bleed more easily. If you have any dental work done, tell your dentist you are receiving this medication. Talk to your care team if you wish to become pregnant or think you might be pregnant. This medication can cause serious birth defects. Talk to your care team about effective forms of contraception. Do not breast-feed while taking this medication. What side effects may I notice from receiving this medication? Side effects that you should report to your care team as soon as possible: Allergic reactions--skin rash, itching, hives, swelling of the face, lips, tongue, or throat Infection--fever, chills, cough, sore throat, wounds that don't heal, pain or trouble when passing urine, general feeling of discomfort or being unwell Low red blood cell level--unusual weakness or fatigue, dizziness, headache, trouble breathing Pain, tingling, or numbness in the hands or  feet, muscle weakness, change in vision, confusion or trouble speaking, loss of balance or coordination, trouble walking, seizures Unusual bruising or bleeding Side effects that usually  do not require medical attention (report to your care team if they continue or are bothersome): Hair loss Nausea Unusual weakness or fatigue Vomiting This list may not describe all possible side effects. Call your doctor for medical advice about side effects. You may report side effects to FDA at 1-800-FDA-1088. Where should I keep my medication? This medication is given in a hospital or clinic. It will not be stored at home. NOTE: This sheet is a summary. It may not cover all possible information. If you have questions about this medicine, talk to your doctor, pharmacist, or health care provider.  2024 Elsevier/Gold Standard (2021-07-07 00:00:00) Paclitaxel Injection What is this medication? PACLITAXEL (PAK li TAX el) treats some types of cancer. It works by slowing down the growth of cancer cells. This medicine may be used for other purposes; ask your health care provider or pharmacist if you have questions. COMMON BRAND NAME(S): Onxol, Taxol What should I tell my care team before I take this medication? They need to know if you have any of these conditions: Heart disease Liver disease Low white blood cell levels An unusual or allergic reaction to paclitaxel, other medications, foods, dyes, or preservatives If you or your partner are pregnant or trying to get pregnant Breast-feeding How should I use this medication? This medication is injected into a vein. It is given by your care team in a hospital or clinic setting. Talk to your care team about the use of this medication in children. While it may be given to children for selected conditions, precautions do apply. Overdosage: If you think you have taken too much of this medicine contact a poison control center or emergency room at once. NOTE: This medicine is only for you. Do not share this medicine with others. What if I miss a dose? Keep appointments for follow-up doses. It is important not to miss your dose. Call your care team if  you are unable to keep an appointment. What may interact with this medication? Do not take this medication with any of the following: Live virus vaccines Other medications may affect the way this medication works. Talk with your care team about all of the medications you take. They may suggest changes to your treatment plan to lower the risk of side effects and to make sure your medications work as intended. This list may not describe all possible interactions. Give your health care provider a list of all the medicines, herbs, non-prescription drugs, or dietary supplements you use. Also tell them if you smoke, drink alcohol, or use illegal drugs. Some items may interact with your medicine. What should I watch for while using this medication? Your condition will be monitored carefully while you are receiving this medication. You may need blood work while taking this medication. This medication may make you feel generally unwell. This is not uncommon as chemotherapy can affect healthy cells as well as cancer cells. Report any side effects. Continue your course of treatment even though you feel ill unless your care team tells you to stop. This medication can cause serious allergic reactions. To reduce the risk, your care team may give you other medications to take before receiving this one. Be sure to follow the directions from your care team. This medication may increase your risk of getting an infection. Call your care team for advice if  you get a fever, chills, sore throat, or other symptoms of a cold or flu. Do not treat yourself. Try to avoid being around people who are sick. This medication may increase your risk to bruise or bleed. Call your care team if you notice any unusual bleeding. Be careful brushing or flossing your teeth or using a toothpick because you may get an infection or bleed more easily. If you have any dental work done, tell your dentist you are receiving this medication. Talk to  your care team if you may be pregnant. Serious birth defects can occur if you take this medication during pregnancy. Talk to your care team before breastfeeding. Changes to your treatment plan may be needed. What side effects may I notice from receiving this medication? Side effects that you should report to your care team as soon as possible: Allergic reactions--skin rash, itching, hives, swelling of the face, lips, tongue, or throat Heart rhythm changes--fast or irregular heartbeat, dizziness, feeling faint or lightheaded, chest pain, trouble breathing Increase in blood pressure Infection--fever, chills, cough, sore throat, wounds that don't heal, pain or trouble when passing urine, general feeling of discomfort or being unwell Low blood pressure--dizziness, feeling faint or lightheaded, blurry vision Low red blood cell level--unusual weakness or fatigue, dizziness, headache, trouble breathing Painful swelling, warmth, or redness of the skin, blisters or sores at the infusion site Pain, tingling, or numbness in the hands or feet Slow heartbeat--dizziness, feeling faint or lightheaded, confusion, trouble breathing, unusual weakness or fatigue Unusual bruising or bleeding Side effects that usually do not require medical attention (report to your care team if they continue or are bothersome): Diarrhea Hair loss Joint pain Loss of appetite Muscle pain Nausea Vomiting This list may not describe all possible side effects. Call your doctor for medical advice about side effects. You may report side effects to FDA at 1-800-FDA-1088. Where should I keep my medication? This medication is given in a hospital or clinic. It will not be stored at home. NOTE: This sheet is a summary. It may not cover all possible information. If you have questions about this medicine, talk to your doctor, pharmacist, or health care provider.  2024 Elsevier/Gold Standard (2021-08-04 00:00:00)

## 2023-07-07 NOTE — Telephone Encounter (Signed)
 Patient has been scheduled for follow-up visit per 07/07/23 LOS.  Pt noted appt details on personal planner/calendar.

## 2023-07-08 ENCOUNTER — Other Ambulatory Visit: Payer: Self-pay

## 2023-07-08 ENCOUNTER — Inpatient Hospital Stay

## 2023-07-08 VITALS — BP 115/79 | HR 81 | Temp 98.4°F | Resp 18

## 2023-07-08 DIAGNOSIS — C3401 Malignant neoplasm of right main bronchus: Secondary | ICD-10-CM

## 2023-07-08 DIAGNOSIS — Z5111 Encounter for antineoplastic chemotherapy: Secondary | ICD-10-CM | POA: Diagnosis not present

## 2023-07-08 MED ORDER — PEGFILGRASTIM-CBQV 6 MG/0.6ML ~~LOC~~ SOSY
6.0000 mg | PREFILLED_SYRINGE | Freq: Once | SUBCUTANEOUS | Status: AC
Start: 2023-07-08 — End: 2023-07-08
  Administered 2023-07-08: 6 mg via SUBCUTANEOUS
  Filled 2023-07-08: qty 0.6

## 2023-07-08 NOTE — Patient Instructions (Signed)

## 2023-07-18 ENCOUNTER — Encounter: Payer: Self-pay | Admitting: Oncology

## 2023-07-21 ENCOUNTER — Encounter: Payer: Self-pay | Admitting: Oncology

## 2023-07-28 ENCOUNTER — Ambulatory Visit: Admitting: Oncology

## 2023-07-28 ENCOUNTER — Ambulatory Visit (HOSPITAL_BASED_OUTPATIENT_CLINIC_OR_DEPARTMENT_OTHER)
Admission: RE | Admit: 2023-07-28 | Discharge: 2023-07-28 | Disposition: A | Discharge status: Home / Self Care | Source: Ambulatory Visit | Attending: Oncology | Admitting: Oncology

## 2023-07-28 ENCOUNTER — Ambulatory Visit

## 2023-07-28 ENCOUNTER — Other Ambulatory Visit

## 2023-07-28 DIAGNOSIS — C3401 Malignant neoplasm of right main bronchus: Secondary | ICD-10-CM | POA: Diagnosis not present

## 2023-07-28 MED ORDER — HEPARIN SOD (PORK) LOCK FLUSH 100 UNIT/ML IV SOLN
500.0000 [IU] | Freq: Once | INTRAVENOUS | Status: AC
  Administered 2023-07-28: 500 [IU] via INTRAVENOUS

## 2023-07-28 MED ORDER — IOHEXOL 300 MG/ML  SOLN
100.0000 mL | Freq: Once | INTRAMUSCULAR | Status: AC | PRN
  Administered 2023-07-28: 75 mL via INTRAVENOUS

## 2023-07-28 NOTE — Progress Notes (Unsigned)
 [facility]  [address] [place],  [place]  [address] [phone]  Clinic Day: 07/29/2023  Referring physician: Lucius Sabins., MD   HISTORY OF PRESENT ILLNESS:  The patient is a 63 y.o. female with stage IVB (T2a N2a M1c1) squamous cell lung cancer, which includes spread of disease to her liver.  She comes in today to go over her CT scans to ascertain her new disease baseline after 3 cycles of carboplatin /paclitaxel .  The patient claims to have tolerated her 3rd cycle of chemotherapy much better due to the dose reduction.  She denies having worsening shortness of breath, hemoptysis or other respiratory issues which concern her for overt signs of disease progression.    PHYSICAL EXAM:  Blood pressure 120/78, pulse 80, temperature 98.5 F (36.9 C), temperature source Oral, resp. rate 18, height 6' (1.829 m), last menstrual period 11/15/2011, SpO2 93%. Wt Readings from Last 3 Encounters:  07/07/23 254 lb 4.8 oz (115.3 kg)  06/24/23 254 lb 4 oz (115.3 kg)  06/16/23 257 lb 12 oz (116.9 kg)   Body mass index is 34.49 kg/m. Performance status (ECOG): 2 - Symptomatic, <50% confined to bed Physical Exam Constitutional:      Appearance: Normal appearance. She is not ill-appearing.     Comments: A pleasant woman in a wheelchair.  She looks stronger vs previous visits.  HENT:     Mouth/Throat:     Mouth: Mucous membranes are moist.     Pharynx: Oropharynx is clear. No oropharyngeal exudate or posterior oropharyngeal erythema.  Cardiovascular:     Rate and Rhythm: Normal rate and regular rhythm.     Heart sounds: No murmur heard.    No friction rub. No gallop.  Pulmonary:     Effort: Pulmonary effort is normal. No respiratory distress.     Breath sounds: Normal breath sounds. No wheezing, rhonchi or rales.  Abdominal:     General: Bowel sounds are normal. There is no distension.     Palpations: Abdomen is soft. There is no mass.     Tenderness:  There is no abdominal tenderness.  Musculoskeletal:        General: No swelling.     Right lower leg: No edema.     Left lower leg: No edema.  Lymphadenopathy:     Cervical: No cervical adenopathy.     Upper Body:     Right upper body: No supraclavicular or axillary adenopathy.     Left upper body: No supraclavicular or axillary adenopathy.     Lower Body: No right inguinal adenopathy. No left inguinal adenopathy.  Skin:    General: Skin is warm.     Coloration: Skin is not jaundiced.     Findings: No lesion or rash.  Neurological:     General: No focal deficit present.     Mental Status: She is alert and oriented to person, place, and time. Mental status is at baseline.  Psychiatric:        Mood and Affect: Mood normal.        Behavior: Behavior normal.        Thought Content: Thought content normal.   SCANS:  Her chest CT from 07-28-2023 revealed the following: FINDINGS: Cardiovascular: The heart size is normal. No substantial pericardial effusion. Coronary artery calcification is evident. Mild atherosclerotic calcification is noted in the wall of the thoracic aorta. Right Port-A-Cath tip is positioned in the low right atrium.   Mediastinum/Nodes: No mediastinal lymphadenopathy. There  is no hilar lymphadenopathy. The esophagus has normal imaging features. There is no axillary lymphadenopathy.   Lungs/Pleura: Centrilobular emphsyema noted. Clustered tree-in-bud nodularity posterior right upper lobe on 41/302 is stable in the interval, compatible sequelae of atypical infection. There is new streaky and consolidative opacity in the right middle lobe possibly related to atelectasis although infection is a possibility. Right lower lobe collapse/consolidation again noted, with some interval evolution as the superior segment is now more opacified than previously but inferior right lower lobe is better aerated than before (compare sagittal 63/602 today to 77/602 previously).  No pleural effusion.   Upper Abdomen: The edema/inflammation seen around the upper pole right kidney previously has decreased with a 1.7 x 1.5 cm exophytic upper pole lesion now visible in the right kidney. Borderline lymphadenopathy is seen in the hepatoduodenal ligament. Scattered tiny hypo attenuating foci in the liver are too small to characterize.   Musculoskeletal: No worrisome lytic or sclerotic osseous abnormality.   IMPRESSION: 1. Right lower lobe collapse/consolidation again noted, with some interval evolution as the superior segment is now more opacified than previously but inferior right lower lobe is better aerated than before. 2. New streaky and consolidative opacity in the right middle lobe possibly related to atelectasis although infection is a possibility. 3. Stable clustered tree-in-bud nodularity posterior right upper lobe, compatible sequelae of atypical infection. 4. 1.7 x 1.5 cm exophytic upper pole lesion now visible in the right kidney. This is indeterminate and located in an area that was obscured by edema/inflammation previously. MRI of the abdomen with and without contrast recommended to further evaluate. 5. Borderline lymphadenopathy in the hepatoduodenal ligament. 6. Aortic Atherosclerosis (ICD10-I70.0) and Emphysema (ICD10-J43.9).  LABS:      Latest Ref Rng & Units 07/29/2023   10:22 AM 07/07/2023    9:09 AM 06/24/2023   10:54 AM  CBC  WBC 4.0 - 10.5 K/uL 5.8  8.6  10.4   Hemoglobin 12.0 - 15.0 g/dL 9.2  8.9  9.1   Hematocrit 36.0 - 46.0 % 28.5  27.5  27.6   Platelets 150 - 400 K/uL 306  434  258       Latest Ref Rng & Units 07/07/2023    9:09 AM 06/24/2023   10:54 AM 06/15/2023    9:51 AM  CMP  Glucose 70 - 99 mg/dL 409  811  914   BUN 8 - 23 mg/dL 9  7  7    Creatinine 0.44 - 1.00 mg/dL 7.82  9.56  2.13   Sodium 135 - 145 mmol/L 131  131  132   Potassium 3.5 - 5.1 mmol/L 4.0  3.7  3.9   Chloride 98 - 111 mmol/L 96  95  96   CO2 22 - 32  mmol/L 23  20  20    Calcium 8.9 - 10.3 mg/dL 08.6  9.9  57.8   Total Protein 6.5 - 8.1 g/dL 8.3  7.7  8.0   Total Bilirubin 0.0 - 1.2 mg/dL 0.3  0.5  0.5   Alkaline Phos 38 - 126 U/L 138  167  140   AST 15 - 41 U/L 16  23  19    ALT 0 - 44 U/L 8  26  13      ASSESSMENT & PLAN:  Assessment/Plan:  A 63 y.o. female with stage IVB (T2a N2a M1c1) squamous cell lung cancer, which includes spread of disease to her liver.  In clinic today, I went over her chest CT images with her, for  which she could see that part of her right lower lobe has reopened.  The persistent, partial collapse of her right lower lobe could be explained by a persistent focus of disease, postradiation changes, or a mucus plug.  I do want her to see pulmonary within the next 4-6 weeks to have this area evaluated.  Otherwise, it appears her disease remains under good control.  The patient is scheduled to go on an [place] trip with her family, he 4th cycle of chemotherapy will be delaying until Monday, May 19th.  I will see her back in at that time to ensure she is doing well before she heads into that 4th cycle of carboplatin /paclitaxel .  The patient understands all the plans discussed today and is in agreement with them.    Leah Skora Felicia Horde, MD

## 2023-07-29 ENCOUNTER — Other Ambulatory Visit: Payer: Self-pay

## 2023-07-29 ENCOUNTER — Inpatient Hospital Stay

## 2023-07-29 ENCOUNTER — Ambulatory Visit

## 2023-07-29 ENCOUNTER — Telehealth: Payer: Self-pay | Admitting: Oncology

## 2023-07-29 ENCOUNTER — Inpatient Hospital Stay: Attending: Oncology | Admitting: Oncology

## 2023-07-29 ENCOUNTER — Encounter: Payer: Self-pay | Admitting: Oncology

## 2023-07-29 VITALS — BP 120/78 | HR 80 | Temp 98.5°F | Resp 18 | Ht 72.0 in

## 2023-07-29 DIAGNOSIS — C3401 Malignant neoplasm of right main bronchus: Secondary | ICD-10-CM

## 2023-07-29 DIAGNOSIS — Z5111 Encounter for antineoplastic chemotherapy: Secondary | ICD-10-CM | POA: Insufficient documentation

## 2023-07-29 DIAGNOSIS — C787 Secondary malignant neoplasm of liver and intrahepatic bile duct: Secondary | ICD-10-CM | POA: Diagnosis not present

## 2023-07-29 DIAGNOSIS — Z5189 Encounter for other specified aftercare: Secondary | ICD-10-CM | POA: Diagnosis not present

## 2023-07-29 LAB — CBC WITH DIFFERENTIAL (CANCER CENTER ONLY)
Abs Immature Granulocytes: 0.04 10*3/uL (ref 0.00–0.07)
Basophils Absolute: 0.1 10*3/uL (ref 0.0–0.1)
Basophils Relative: 1 %
Eosinophils Absolute: 0.1 10*3/uL (ref 0.0–0.5)
Eosinophils Relative: 2 %
HCT: 28.5 % — ABNORMAL LOW (ref 36.0–46.0)
Hemoglobin: 9.2 g/dL — ABNORMAL LOW (ref 12.0–15.0)
Immature Granulocytes: 1 %
Lymphocytes Relative: 15 %
Lymphs Abs: 0.9 10*3/uL (ref 0.7–4.0)
MCH: 29.1 pg (ref 26.0–34.0)
MCHC: 32.3 g/dL (ref 30.0–36.0)
MCV: 90.2 fL (ref 80.0–100.0)
Monocytes Absolute: 0.7 10*3/uL (ref 0.1–1.0)
Monocytes Relative: 13 %
Neutro Abs: 4 10*3/uL (ref 1.7–7.7)
Neutrophils Relative %: 68 %
Platelet Count: 306 10*3/uL (ref 150–400)
RBC: 3.16 MIL/uL — ABNORMAL LOW (ref 3.87–5.11)
RDW: 16.2 % — ABNORMAL HIGH (ref 11.5–15.5)
WBC Count: 5.8 10*3/uL (ref 4.0–10.5)
nRBC: 0 % (ref 0.0–0.2)
nRBC: 0 /100{WBCs}

## 2023-07-29 NOTE — Telephone Encounter (Signed)
 Patient has been scheduled for follow-up visit per 07/29/23 LOS.  Pt given an appt calendar with date and time.

## 2023-07-30 ENCOUNTER — Encounter: Payer: Self-pay | Admitting: Oncology

## 2023-07-30 ENCOUNTER — Other Ambulatory Visit: Payer: Self-pay

## 2023-08-14 NOTE — Progress Notes (Signed)
 [facility]  [address] [place],  [place]  [address] [phone]  Clinic Day: 08/15/2023  Referring physician: Lucius Sabins., MD   HISTORY OF PRESENT ILLNESS:  The patient is a 63 y.o. female with stage IVB (T2a N2a M1c1) squamous cell lung cancer, which includes spread of disease to her liver.  She comes in today to be evaluated before heading into her fourth cycle of carboplatin /paclitaxel .  Of note, this cycle of treatment was delayed for a few weeks to enable her to go on a [place] trip with her family.  She comes back in today doing very well.  She denies having any shortness of breath, hemoptysis or other respiratory issues which concern her for overt signs of disease progression.    PHYSICAL EXAM:  Blood pressure 126/84, pulse 81, temperature 98.9 F (37.2 C), temperature source Oral, resp. rate 16, height 6' (1.829 m), weight 255 lb 12.8 oz (116 kg), last menstrual period 11/15/2011, SpO2 96%. Wt Readings from Last 3 Encounters:  08/15/23 255 lb 12.8 oz (116 kg)  07/07/23 254 lb 4.8 oz (115.3 kg)  06/24/23 254 lb 4 oz (115.3 kg)   Body mass index is 34.69 kg/m. Performance status (ECOG): 2 - Symptomatic, <50% confined to bed Physical Exam Constitutional:      Appearance: Normal appearance. She is not ill-appearing.     Comments: A pleasant woman in a wheelchair.  She looks stronger vs previous visits.  HENT:     Mouth/Throat:     Mouth: Mucous membranes are moist.     Pharynx: Oropharynx is clear. No oropharyngeal exudate or posterior oropharyngeal erythema.  Cardiovascular:     Rate and Rhythm: Normal rate and regular rhythm.     Heart sounds: No murmur heard.    No friction rub. No gallop.  Pulmonary:     Effort: Pulmonary effort is normal. No respiratory distress.     Breath sounds: Normal breath sounds. No wheezing, rhonchi or rales.  Abdominal:     General: Bowel sounds are normal. There is no distension.     Palpations:  Abdomen is soft. There is no mass.     Tenderness: There is no abdominal tenderness.  Musculoskeletal:        General: No swelling.     Right lower leg: No edema.     Left lower leg: No edema.  Lymphadenopathy:     Cervical: No cervical adenopathy.     Upper Body:     Right upper body: No supraclavicular or axillary adenopathy.     Left upper body: No supraclavicular or axillary adenopathy.     Lower Body: No right inguinal adenopathy. No left inguinal adenopathy.  Skin:    General: Skin is warm.     Coloration: Skin is not jaundiced.     Findings: No lesion or rash.  Neurological:     General: No focal deficit present.     Mental Status: She is alert and oriented to person, place, and time. Mental status is at baseline.  Psychiatric:        Mood and Affect: Mood normal.        Behavior: Behavior normal.        Thought Content: Thought content normal.    LABS:      Latest Ref Rng & Units 08/15/2023    9:14 AM 07/29/2023   10:22 AM 07/07/2023    9:09 AM  CBC  WBC 4.0 - 10.5 K/uL 7.8  5.8  8.6   Hemoglobin 12.0 - 15.0 g/dL 40.9  9.2  8.9   Hematocrit 36.0 - 46.0 % 32.2  28.5  27.5   Platelets 150 - 400 K/uL 276  306  434       Latest Ref Rng & Units 08/15/2023    9:14 AM 07/07/2023    9:09 AM 06/24/2023   10:54 AM  CMP  Glucose 70 - 99 mg/dL 811  914  782   BUN 8 - 23 mg/dL 9  9  7    Creatinine 0.44 - 1.00 mg/dL 9.56  2.13  0.86   Sodium 135 - 145 mmol/L 136  131  131   Potassium 3.5 - 5.1 mmol/L 3.8  4.0  3.7   Chloride 98 - 111 mmol/L 98  96  95   CO2 22 - 32 mmol/L 25  23  20    Calcium 8.9 - 10.3 mg/dL 57.8  46.9  9.9   Total Protein 6.5 - 8.1 g/dL 7.8  8.3  7.7   Total Bilirubin 0.0 - 1.2 mg/dL 0.3  0.3  0.5   Alkaline Phos 38 - 126 U/L 152  138  167   AST 15 - 41 U/L 14  16  23    ALT 0 - 44 U/L 8  8  26      ASSESSMENT & PLAN:  Assessment/Plan:  A 63 y.o. female with stage IVB (T2a N2a M1c1) squamous cell lung cancer, which includes spread of disease to her  liver.  She we will proceed with her fourth cycle of carboplatin /paclitaxel  today.  It is currently at a 20% dose reduction for better tolerability.  Clinically, the patient appears to be doing very well.  I will see her back in 3 weeks before she has into her 5th cycle of carboplatin /paclitaxel .  The patient understands all the plans discussed today and is in agreement with them.    Naji Mehringer Felicia Horde, MD

## 2023-08-15 ENCOUNTER — Inpatient Hospital Stay: Admitting: Oncology

## 2023-08-15 ENCOUNTER — Other Ambulatory Visit: Payer: Self-pay | Admitting: Pharmacist

## 2023-08-15 ENCOUNTER — Inpatient Hospital Stay

## 2023-08-15 ENCOUNTER — Encounter: Payer: Self-pay | Admitting: Oncology

## 2023-08-15 VITALS — BP 126/84 | HR 81 | Temp 98.9°F | Resp 16 | Ht 72.0 in | Wt 255.8 lb

## 2023-08-15 DIAGNOSIS — C3401 Malignant neoplasm of right main bronchus: Secondary | ICD-10-CM

## 2023-08-15 DIAGNOSIS — Z7969 Long term (current) use of other immunomodulators and immunosuppressants: Secondary | ICD-10-CM

## 2023-08-15 DIAGNOSIS — R112 Nausea with vomiting, unspecified: Secondary | ICD-10-CM

## 2023-08-15 DIAGNOSIS — Z5111 Encounter for antineoplastic chemotherapy: Secondary | ICD-10-CM | POA: Diagnosis not present

## 2023-08-15 LAB — CBC WITH DIFFERENTIAL (CANCER CENTER ONLY)
Abs Immature Granulocytes: 0.07 10*3/uL (ref 0.00–0.07)
Basophils Absolute: 0 10*3/uL (ref 0.0–0.1)
Basophils Relative: 1 %
Eosinophils Absolute: 0.2 10*3/uL (ref 0.0–0.5)
Eosinophils Relative: 2 %
HCT: 32.2 % — ABNORMAL LOW (ref 36.0–46.0)
Hemoglobin: 10.3 g/dL — ABNORMAL LOW (ref 12.0–15.0)
Immature Granulocytes: 1 %
Lymphocytes Relative: 13 %
Lymphs Abs: 1 10*3/uL (ref 0.7–4.0)
MCH: 29.2 pg (ref 26.0–34.0)
MCHC: 32 g/dL (ref 30.0–36.0)
MCV: 91.2 fL (ref 80.0–100.0)
Monocytes Absolute: 0.5 10*3/uL (ref 0.1–1.0)
Monocytes Relative: 6 %
Neutro Abs: 6.1 10*3/uL (ref 1.7–7.7)
Neutrophils Relative %: 77 %
Platelet Count: 276 10*3/uL (ref 150–400)
RBC: 3.53 MIL/uL — ABNORMAL LOW (ref 3.87–5.11)
RDW: 16.6 % — ABNORMAL HIGH (ref 11.5–15.5)
WBC Count: 7.8 10*3/uL (ref 4.0–10.5)
nRBC: 0.3 % — ABNORMAL HIGH (ref 0.0–0.2)

## 2023-08-15 LAB — COMPREHENSIVE METABOLIC PANEL WITH GFR
ALT: 8 U/L (ref 0–44)
AST: 14 U/L — ABNORMAL LOW (ref 15–41)
Albumin: 3.8 g/dL (ref 3.5–5.0)
Alkaline Phosphatase: 152 U/L — ABNORMAL HIGH (ref 38–126)
Anion gap: 14 (ref 5–15)
BUN: 9 mg/dL (ref 8–23)
CO2: 25 mmol/L (ref 22–32)
Calcium: 10.2 mg/dL (ref 8.9–10.3)
Chloride: 98 mmol/L (ref 98–111)
Creatinine, Ser: 0.73 mg/dL (ref 0.44–1.00)
GFR, Estimated: >60 mL/min (ref >60)
Glucose, Bld: 150 mg/dL — ABNORMAL HIGH (ref 70–99)
Potassium: 3.8 mmol/L (ref 3.5–5.1)
Sodium: 136 mmol/L (ref 135–145)
Total Bilirubin: 0.3 mg/dL (ref 0.0–1.2)
Total Protein: 7.8 g/dL (ref 6.5–8.1)

## 2023-08-15 LAB — MAGNESIUM: Magnesium: 1.2 mg/dL — ABNORMAL LOW (ref 1.7–2.4)

## 2023-08-15 MED ORDER — DEXAMETHASONE SODIUM PHOSPHATE 10 MG/ML IJ SOLN
10.0000 mg | Freq: Once | INTRAMUSCULAR | Status: AC
  Administered 2023-08-15: 10 mg via INTRAVENOUS
  Filled 2023-08-15: qty 1

## 2023-08-15 MED ORDER — MAGNESIUM SULFATE 2 GM/50ML IV SOLN
2.0000 g | Freq: Once | INTRAVENOUS | Status: AC
  Administered 2023-08-15: 2 g via INTRAVENOUS
  Filled 2023-08-15: qty 50

## 2023-08-15 MED ORDER — SODIUM CHLORIDE 0.9 % IV SOLN: INTRAVENOUS | Status: DC

## 2023-08-15 MED ORDER — SODIUM CHLORIDE 0.9 % IV SOLN
480.0000 mg | Freq: Once | INTRAVENOUS | Status: AC
  Administered 2023-08-15: 480 mg via INTRAVENOUS
  Filled 2023-08-15: qty 48

## 2023-08-15 MED ORDER — PACLITAXEL CHEMO INJECTION 300 MG/50ML
116.0000 mg/m2 | Freq: Once | INTRAVENOUS | Status: AC
  Administered 2023-08-15: 282 mg via INTRAVENOUS
  Filled 2023-08-15: qty 47

## 2023-08-15 MED ORDER — SODIUM CHLORIDE 0.9 % IV SOLN
150.0000 mg | Freq: Once | INTRAVENOUS | Status: AC
  Administered 2023-08-15: 150 mg via INTRAVENOUS
  Filled 2023-08-15: qty 150

## 2023-08-15 MED ORDER — PALONOSETRON HCL INJECTION 0.25 MG/5ML
0.2500 mg | Freq: Once | INTRAVENOUS | Status: AC
  Administered 2023-08-15: 0.25 mg via INTRAVENOUS
  Filled 2023-08-15: qty 5

## 2023-08-15 MED ORDER — DIPHENHYDRAMINE HCL 50 MG/ML IJ SOLN
50.0000 mg | Freq: Once | INTRAMUSCULAR | Status: AC
  Administered 2023-08-15: 50 mg via INTRAVENOUS
  Filled 2023-08-15: qty 1

## 2023-08-15 MED ORDER — FAMOTIDINE IN NACL 20-0.9 MG/50ML-% IV SOLN
20.0000 mg | Freq: Once | INTRAVENOUS | Status: AC
  Administered 2023-08-15: 20 mg via INTRAVENOUS
  Filled 2023-08-15: qty 50

## 2023-08-15 MED ORDER — HEPARIN SOD (PORK) LOCK FLUSH 100 UNIT/ML IV SOLN
500.0000 [IU] | Freq: Once | INTRAVENOUS | Status: AC | PRN
  Administered 2023-08-15: 500 [IU]

## 2023-08-15 MED ORDER — SODIUM CHLORIDE 0.9% FLUSH
10.0000 mL | INTRAVENOUS | Status: DC | PRN
  Administered 2023-08-15: 10 mL

## 2023-08-15 NOTE — Patient Instructions (Signed)
Carboplatin Injection What is this medication? CARBOPLATIN (KAR boe pla tin) treats some types of cancer. It works by slowing down the growth of cancer cells. This medicine may be used for other purposes; ask your health care provider or pharmacist if you have questions. COMMON BRAND NAME(S): Paraplatin What should I tell my care team before I take this medication? They need to know if you have any of these conditions: Blood disorders Hearing problems Kidney disease Recent or ongoing radiation therapy An unusual or allergic reaction to carboplatin, cisplatin, other medications, foods, dyes, or preservatives Pregnant or trying to get pregnant Breast-feeding How should I use this medication? This medication is injected into a vein. It is given by your care team in a hospital or clinic setting. Talk to your care team about the use of this medication in children. Special care may be needed. Overdosage: If you think you have taken too much of this medicine contact a poison control center or emergency room at once. NOTE: This medicine is only for you. Do not share this medicine with others. What if I miss a dose? Keep appointments for follow-up doses. It is important not to miss your dose. Call your care team if you are unable to keep an appointment. What may interact with this medication? Medications for seizures Some antibiotics, such as amikacin, gentamicin, neomycin, streptomycin, tobramycin Vaccines This list may not describe all possible interactions. Give your health care provider a list of all the medicines, herbs, non-prescription drugs, or dietary supplements you use. Also tell them if you smoke, drink alcohol, or use illegal drugs. Some items may interact with your medicine. What should I watch for while using this medication? Your condition will be monitored carefully while you are receiving this medication. You may need blood work while taking this medication. This medication may  make you feel generally unwell. This is not uncommon, as chemotherapy can affect healthy cells as well as cancer cells. Report any side effects. Continue your course of treatment even though you feel ill unless your care team tells you to stop. In some cases, you may be given additional medications to help with side effects. Follow all directions for their use. This medication may increase your risk of getting an infection. Call your care team for advice if you get a fever, chills, sore throat, or other symptoms of a cold or flu. Do not treat yourself. Try to avoid being around people who are sick. Avoid taking medications that contain aspirin, acetaminophen, ibuprofen, naproxen, or ketoprofen unless instructed by your care team. These medications may hide a fever. Be careful brushing or flossing your teeth or using a toothpick because you may get an infection or bleed more easily. If you have any dental work done, tell your dentist you are receiving this medication. Talk to your care team if you wish to become pregnant or think you might be pregnant. This medication can cause serious birth defects. Talk to your care team about effective forms of contraception. Do not breast-feed while taking this medication. What side effects may I notice from receiving this medication? Side effects that you should report to your care team as soon as possible: Allergic reactions--skin rash, itching, hives, swelling of the face, lips, tongue, or throat Infection--fever, chills, cough, sore throat, wounds that don't heal, pain or trouble when passing urine, general feeling of discomfort or being unwell Low red blood cell level--unusual weakness or fatigue, dizziness, headache, trouble breathing Pain, tingling, or numbness in the hands or  feet, muscle weakness, change in vision, confusion or trouble speaking, loss of balance or coordination, trouble walking, seizures Unusual bruising or bleeding Side effects that usually  do not require medical attention (report to your care team if they continue or are bothersome): Hair loss Nausea Unusual weakness or fatigue Vomiting This list may not describe all possible side effects. Call your doctor for medical advice about side effects. You may report side effects to FDA at 1-800-FDA-1088. Where should I keep my medication? This medication is given in a hospital or clinic. It will not be stored at home. NOTE: This sheet is a summary. It may not cover all possible information. If you have questions about this medicine, talk to your doctor, pharmacist, or health care provider.  2024 Elsevier/Gold Standard (2021-07-07 00:00:00) Paclitaxel Injection What is this medication? PACLITAXEL (PAK li TAX el) treats some types of cancer. It works by slowing down the growth of cancer cells. This medicine may be used for other purposes; ask your health care provider or pharmacist if you have questions. COMMON BRAND NAME(S): Onxol, Taxol What should I tell my care team before I take this medication? They need to know if you have any of these conditions: Heart disease Liver disease Low white blood cell levels An unusual or allergic reaction to paclitaxel, other medications, foods, dyes, or preservatives If you or your partner are pregnant or trying to get pregnant Breast-feeding How should I use this medication? This medication is injected into a vein. It is given by your care team in a hospital or clinic setting. Talk to your care team about the use of this medication in children. While it may be given to children for selected conditions, precautions do apply. Overdosage: If you think you have taken too much of this medicine contact a poison control center or emergency room at once. NOTE: This medicine is only for you. Do not share this medicine with others. What if I miss a dose? Keep appointments for follow-up doses. It is important not to miss your dose. Call your care team if  you are unable to keep an appointment. What may interact with this medication? Do not take this medication with any of the following: Live virus vaccines Other medications may affect the way this medication works. Talk with your care team about all of the medications you take. They may suggest changes to your treatment plan to lower the risk of side effects and to make sure your medications work as intended. This list may not describe all possible interactions. Give your health care provider a list of all the medicines, herbs, non-prescription drugs, or dietary supplements you use. Also tell them if you smoke, drink alcohol, or use illegal drugs. Some items may interact with your medicine. What should I watch for while using this medication? Your condition will be monitored carefully while you are receiving this medication. You may need blood work while taking this medication. This medication may make you feel generally unwell. This is not uncommon as chemotherapy can affect healthy cells as well as cancer cells. Report any side effects. Continue your course of treatment even though you feel ill unless your care team tells you to stop. This medication can cause serious allergic reactions. To reduce the risk, your care team may give you other medications to take before receiving this one. Be sure to follow the directions from your care team. This medication may increase your risk of getting an infection. Call your care team for advice if  you get a fever, chills, sore throat, or other symptoms of a cold or flu. Do not treat yourself. Try to avoid being around people who are sick. This medication may increase your risk to bruise or bleed. Call your care team if you notice any unusual bleeding. Be careful brushing or flossing your teeth or using a toothpick because you may get an infection or bleed more easily. If you have any dental work done, tell your dentist you are receiving this medication. Talk to  your care team if you may be pregnant. Serious birth defects can occur if you take this medication during pregnancy. Talk to your care team before breastfeeding. Changes to your treatment plan may be needed. What side effects may I notice from receiving this medication? Side effects that you should report to your care team as soon as possible: Allergic reactions--skin rash, itching, hives, swelling of the face, lips, tongue, or throat Heart rhythm changes--fast or irregular heartbeat, dizziness, feeling faint or lightheaded, chest pain, trouble breathing Increase in blood pressure Infection--fever, chills, cough, sore throat, wounds that don't heal, pain or trouble when passing urine, general feeling of discomfort or being unwell Low blood pressure--dizziness, feeling faint or lightheaded, blurry vision Low red blood cell level--unusual weakness or fatigue, dizziness, headache, trouble breathing Painful swelling, warmth, or redness of the skin, blisters or sores at the infusion site Pain, tingling, or numbness in the hands or feet Slow heartbeat--dizziness, feeling faint or lightheaded, confusion, trouble breathing, unusual weakness or fatigue Unusual bruising or bleeding Side effects that usually do not require medical attention (report to your care team if they continue or are bothersome): Diarrhea Hair loss Joint pain Loss of appetite Muscle pain Nausea Vomiting This list may not describe all possible side effects. Call your doctor for medical advice about side effects. You may report side effects to FDA at 1-800-FDA-1088. Where should I keep my medication? This medication is given in a hospital or clinic. It will not be stored at home. NOTE: This sheet is a summary. It may not cover all possible information. If you have questions about this medicine, talk to your doctor, pharmacist, or health care provider.  2024 Elsevier/Gold Standard (2021-08-04 00:00:00)

## 2023-08-16 ENCOUNTER — Inpatient Hospital Stay

## 2023-08-16 ENCOUNTER — Other Ambulatory Visit: Payer: Self-pay

## 2023-08-17 ENCOUNTER — Inpatient Hospital Stay

## 2023-08-17 VITALS — BP 94/72 | HR 87 | Temp 98.0°F | Resp 18

## 2023-08-17 DIAGNOSIS — R110 Nausea: Secondary | ICD-10-CM

## 2023-08-17 DIAGNOSIS — C3401 Malignant neoplasm of right main bronchus: Secondary | ICD-10-CM

## 2023-08-17 DIAGNOSIS — Z5111 Encounter for antineoplastic chemotherapy: Secondary | ICD-10-CM | POA: Diagnosis not present

## 2023-08-17 MED ORDER — SODIUM CHLORIDE 0.9 % IV SOLN: INTRAVENOUS | Status: DC

## 2023-08-17 MED ORDER — PEGFILGRASTIM-CBQV 6 MG/0.6ML ~~LOC~~ SOSY
6.0000 mg | PREFILLED_SYRINGE | Freq: Once | SUBCUTANEOUS | Status: AC
Start: 2023-08-17 — End: 2023-08-17
  Administered 2023-08-17: 6 mg via SUBCUTANEOUS
  Filled 2023-08-17: qty 0.6

## 2023-08-17 MED ORDER — HEPARIN SOD (PORK) LOCK FLUSH 100 UNIT/ML IV SOLN
250.0000 [IU] | Freq: Once | INTRAVENOUS | Status: AC | PRN
  Administered 2023-08-17: 250 [IU]

## 2023-08-17 MED ORDER — MAGNESIUM SULFATE 2 GM/50ML IV SOLN
2.0000 g | Freq: Once | INTRAVENOUS | Status: AC
  Administered 2023-08-17: 2 g via INTRAVENOUS
  Filled 2023-08-17: qty 50

## 2023-08-17 MED ORDER — SODIUM CHLORIDE 0.9% FLUSH
3.0000 mL | Freq: Once | INTRAVENOUS | Status: AC | PRN
  Administered 2023-08-17: 3 mL

## 2023-08-17 NOTE — Patient Instructions (Signed)
 Hypomagnesemia Hypomagnesemia is a condition in which the level of magnesium in the blood is too low. Magnesium is a mineral that is found in many foods. It is used in many different processes in the body. Hypomagnesemia can affect every organ in the body. In severe cases, it can cause life-threatening problems. What are the causes? This condition may be caused by: Not getting enough magnesium in your diet or not having enough healthy foods to eat (malnutrition). Problems with magnesium absorption in the intestines. Dehydration. Excessive use of alcohol. Vomiting. Severe or long-term (chronic) diarrhea. Some medicines, including medicines that make you urinate more often (diuretics). Certain diseases, such as kidney disease, diabetes, celiac disease, and overactive thyroid. What are the signs or symptoms? Symptoms of this condition include: Loss of appetite, nausea, and vomiting. Involuntary shaking or trembling of a body part (tremor). Muscle weakness or tingling in the arms and legs. Sudden tightening of muscles (muscle spasms). Confusion. Psychiatric issues, such as: Depression and irritability. Psychosis. A feeling of fluttering of the heart (palpitations). Seizures. These symptoms are more severe if magnesium levels drop suddenly. How is this diagnosed? This condition may be diagnosed based on: Your symptoms and medical history. A physical exam. Blood and urine tests. How is this treated? Treatment depends on the cause and the severity of the condition. It may be treated by: Taking a magnesium supplement. This can be taken in pill form. If the condition is severe, magnesium is usually given through an IV. Making changes to your diet. You may be directed to eat foods that have a lot of magnesium, such as green leafy vegetables, peas, beans, and nuts. Not drinking alcohol. If you are struggling not to drink, ask your health care provider for help. Follow these instructions at  home: Eating and drinking     Make sure that your diet includes foods with magnesium. Foods that have a lot of magnesium in them include: Green leafy vegetables, such as spinach and broccoli. Beans and peas. Nuts and seeds, such as almonds and sunflower seeds. Whole grains, such as whole grain bread and fortified cereals. Drink fluids that contain salts and minerals (electrolytes), such as sports drinks, when you are active. Do not drink alcohol. General instructions Take over-the-counter and prescription medicines only as told by your health care provider. Take magnesium supplements as directed if your health care provider tells you to take them. Have your magnesium levels monitored as told by your health care provider. Keep all follow-up visits. This is important. Contact a health care provider if: You get worse instead of better. Your symptoms return. Get help right away if: You develop severe muscle weakness. You have trouble breathing. You feel that your heart is racing. These symptoms may represent a serious problem that is an emergency. Do not wait to see if the symptoms will go away. Get medical help right away. Call your local emergency services (911 in the [place] Do not drive yourself to the hospital. Summary Hypomagnesemia is a condition in which the level of magnesium in the blood is too low. Hypomagnesemia can affect every organ in the body. Treatment may include eating more foods that contain magnesium, taking magnesium supplements, and not drinking alcohol. Have your magnesium levels monitored as told by your health care provider. This information is not intended to replace advice given to you by your health care provider. Make sure you discuss any questions you have with your health care provider. Document Revised: 08/12/2020 Document Reviewed: 08/12/2020 Elsevier Patient Education  2024 Elsevier Inc.

## 2023-08-23 ENCOUNTER — Encounter: Payer: Self-pay | Admitting: Oncology

## 2023-08-23 ENCOUNTER — Telehealth: Payer: Self-pay

## 2023-08-23 MED ORDER — PREDNISONE 10 MG (21) PO TBPK: ORAL_TABLET | ORAL | 0 refills | Status: DC

## 2023-08-23 NOTE — Telephone Encounter (Signed)
 Pt started having red whelps and itching from top of head to feet yesterday. The only thing new that he has taken is the Magnesium  we gave her and the chemo tx. She is unable to sleep. Benadryl  hasn't helped at all. Reported above to Dr Harles Lied and Amalia Badder [facility].  Amalia Badder Phy,RPH: Could be due to the paclitaxel . Should we switch her to nab-paclitaxel  for next time? Add a steroid dose pack for now?   Keiland Pickering,RN per Dr Harles Lied: He is fine with switching to nab-paclitaxel  and steroid dose pack. Also offered for her to come in for IV benadryl  or pepcid .

## 2023-09-04 NOTE — Progress Notes (Unsigned)
 [facility]  [address] [place],  [place]  [address] [phone]  Clinic Day: 09/05/2023  Referring physician: Lucius Sabins., MD   HISTORY OF PRESENT ILLNESS:  The patient is a 63 y.o. female with stage IVB (T2a N2a M1c1) squamous cell lung cancer, which includes spread of disease to her liver.  She comes in today to be evaluated before heading into her 5th cycle of palliative chemotherapy.  Of note, the patient had an allergic reaction to paclitaxel  after her fourth cycle of treatment.  Based upon this, she is being switched to carboplatin /Abraxane .  Overall, the patient has been doing very well over these past few weeks.  She denies having any shortness of breath, hemoptysis or other respiratory issues which concern her for overt signs of disease progression.    PHYSICAL EXAM:  Blood pressure 134/85, pulse 91, temperature 99.4 F (37.4 C), temperature source Oral, resp. rate 16, height 6' (1.829 m), weight 256 lb 8 oz (116.3 kg), last menstrual period 11/15/2011, SpO2 96%. Wt Readings from Last 3 Encounters:  09/05/23 256 lb 8 oz (116.3 kg)  08/15/23 255 lb 12.8 oz (116 kg)  07/07/23 254 lb 4.8 oz (115.3 kg)   Body mass index is 34.79 kg/m. Performance status (ECOG): 2 - Symptomatic, <50% confined to bed Physical Exam Constitutional:      Appearance: Normal appearance. She is not ill-appearing.     Comments: A pleasant woman in a wheelchair.  She looks stronger vs previous visits.  HENT:     Mouth/Throat:     Mouth: Mucous membranes are moist.     Pharynx: Oropharynx is clear. No oropharyngeal exudate or posterior oropharyngeal erythema.  Cardiovascular:     Rate and Rhythm: Normal rate and regular rhythm.     Heart sounds: No murmur heard.    No friction rub. No gallop.  Pulmonary:     Effort: Pulmonary effort is normal. No respiratory distress.     Breath sounds: Normal breath sounds. No wheezing, rhonchi or rales.  Abdominal:      General: Bowel sounds are normal. There is no distension.     Palpations: Abdomen is soft. There is no mass.     Tenderness: There is no abdominal tenderness.  Musculoskeletal:        General: No swelling.     Right lower leg: No edema.     Left lower leg: No edema.  Lymphadenopathy:     Cervical: No cervical adenopathy.     Upper Body:     Right upper body: No supraclavicular or axillary adenopathy.     Left upper body: No supraclavicular or axillary adenopathy.     Lower Body: No right inguinal adenopathy. No left inguinal adenopathy.  Skin:    General: Skin is warm.     Coloration: Skin is not jaundiced.     Findings: No lesion or rash.  Neurological:     General: No focal deficit present.     Mental Status: She is alert and oriented to person, place, and time. Mental status is at baseline.  Psychiatric:        Mood and Affect: Mood normal.        Behavior: Behavior normal.        Thought Content: Thought content normal.    LABS:      Latest Ref Rng & Units 09/05/2023    8:58 AM 08/15/2023    9:14 AM 07/29/2023   10:22 AM  CBC  WBC 4.0 - 10.5 K/uL 5.1  7.8  5.8   Hemoglobin 12.0 - 15.0 g/dL 16.1  09.6  9.2   Hematocrit 36.0 - 46.0 % 34.6  32.2  28.5   Platelets 150 - 400 K/uL 284  276  306       Latest Ref Rng & Units 09/05/2023    8:58 AM 08/15/2023    9:14 AM 07/07/2023    9:09 AM  CMP  Glucose 70 - 99 mg/dL 045  409  811   BUN 8 - 23 mg/dL 6  9  9    Creatinine 0.44 - 1.00 mg/dL 9.14  7.82  9.56   Sodium 135 - 145 mmol/L 135  136  131   Potassium 3.5 - 5.1 mmol/L 3.4  3.8  4.0   Chloride 98 - 111 mmol/L 97  98  96   CO2 22 - 32 mmol/L 25  25  23    Calcium 8.9 - 10.3 mg/dL 21.3  08.6  57.8   Total Protein 6.5 - 8.1 g/dL 7.5  7.8  8.3   Total Bilirubin 0.0 - 1.2 mg/dL 0.3  0.3  0.3   Alkaline Phos 38 - 126 U/L 155  152  138   AST 15 - 41 U/L 18  14  16    ALT 0 - 44 U/L 14  8  8      ASSESSMENT & PLAN:  Assessment/Plan:  A 63 y.o. female with stage IVB (T2a N2a  M1c1) squamous cell lung cancer, which includes spread of disease to her liver.  She we will proceed with her fifth cycle of palliative chemotherapy today, which will now consist of carboplatin /Abraxane .  Clinically, the patient appears to be doing very well.  I will see her back in 3 weeks before she has into her 6th and final cycle of palliative chemotherapy.  The patient understands all the plans discussed today and is in agreement with them.    Keylen Uzelac Felicia Horde, MD

## 2023-09-05 ENCOUNTER — Inpatient Hospital Stay

## 2023-09-05 ENCOUNTER — Encounter: Payer: Self-pay | Admitting: Oncology

## 2023-09-05 ENCOUNTER — Inpatient Hospital Stay: Attending: Oncology | Admitting: Oncology

## 2023-09-05 VITALS — BP 134/85 | HR 91 | Temp 99.4°F | Resp 16 | Ht 72.0 in | Wt 256.5 lb

## 2023-09-05 DIAGNOSIS — Z5189 Encounter for other specified aftercare: Secondary | ICD-10-CM | POA: Insufficient documentation

## 2023-09-05 DIAGNOSIS — C3401 Malignant neoplasm of right main bronchus: Secondary | ICD-10-CM | POA: Diagnosis present

## 2023-09-05 DIAGNOSIS — C787 Secondary malignant neoplasm of liver and intrahepatic bile duct: Secondary | ICD-10-CM | POA: Diagnosis not present

## 2023-09-05 DIAGNOSIS — Z5111 Encounter for antineoplastic chemotherapy: Secondary | ICD-10-CM | POA: Diagnosis present

## 2023-09-05 LAB — CBC WITH DIFFERENTIAL (CANCER CENTER ONLY)
Abs Immature Granulocytes: 0.04 10*3/uL (ref 0.00–0.07)
Basophils Absolute: 0 10*3/uL (ref 0.0–0.1)
Basophils Relative: 1 %
Eosinophils Absolute: 0 10*3/uL (ref 0.0–0.5)
Eosinophils Relative: 1 %
HCT: 34.6 % — ABNORMAL LOW (ref 36.0–46.0)
Hemoglobin: 10.8 g/dL — ABNORMAL LOW (ref 12.0–15.0)
Immature Granulocytes: 1 %
Lymphocytes Relative: 19 %
Lymphs Abs: 1 10*3/uL (ref 0.7–4.0)
MCH: 28.8 pg (ref 26.0–34.0)
MCHC: 31.2 g/dL (ref 30.0–36.0)
MCV: 92.3 fL (ref 80.0–100.0)
Monocytes Absolute: 0.5 10*3/uL (ref 0.1–1.0)
Monocytes Relative: 10 %
Neutro Abs: 3.5 10*3/uL (ref 1.7–7.7)
Neutrophils Relative %: 68 %
Platelet Count: 284 10*3/uL (ref 150–400)
RBC: 3.75 MIL/uL — ABNORMAL LOW (ref 3.87–5.11)
RDW: 15.1 % (ref 11.5–15.5)
WBC Count: 5.1 10*3/uL (ref 4.0–10.5)
nRBC: 0 % (ref 0.0–0.2)

## 2023-09-05 LAB — CMP (CANCER CENTER ONLY)
ALT: 14 U/L (ref 0–44)
AST: 18 U/L (ref 15–41)
Albumin: 4.2 g/dL (ref 3.5–5.0)
Alkaline Phosphatase: 155 U/L — ABNORMAL HIGH (ref 38–126)
Anion gap: 13 (ref 5–15)
BUN: 6 mg/dL — ABNORMAL LOW (ref 8–23)
CO2: 25 mmol/L (ref 22–32)
Calcium: 10.7 mg/dL — ABNORMAL HIGH (ref 8.9–10.3)
Chloride: 97 mmol/L — ABNORMAL LOW (ref 98–111)
Creatinine: 0.74 mg/dL (ref 0.44–1.00)
GFR, Estimated: >60 mL/min
Glucose, Bld: 153 mg/dL — ABNORMAL HIGH (ref 70–99)
Potassium: 3.4 mmol/L — ABNORMAL LOW (ref 3.5–5.1)
Sodium: 135 mmol/L (ref 135–145)
Total Bilirubin: 0.3 mg/dL (ref 0.0–1.2)
Total Protein: 7.5 g/dL (ref 6.5–8.1)

## 2023-09-05 LAB — MAGNESIUM: Magnesium: 1.7 mg/dL (ref 1.7–2.4)

## 2023-09-05 MED ORDER — HEPARIN SOD (PORK) LOCK FLUSH 100 UNIT/ML IV SOLN
500.0000 [IU] | Freq: Once | INTRAVENOUS | Status: AC | PRN
  Administered 2023-09-05: 500 [IU]

## 2023-09-05 MED ORDER — SODIUM CHLORIDE 0.9% FLUSH
10.0000 mL | INTRAVENOUS | Status: DC | PRN
  Administered 2023-09-05: 10 mL

## 2023-09-05 MED ORDER — DEXAMETHASONE SODIUM PHOSPHATE 10 MG/ML IJ SOLN
10.0000 mg | Freq: Once | INTRAMUSCULAR | Status: AC
  Administered 2023-09-05: 10 mg via INTRAVENOUS
  Filled 2023-09-05: qty 1

## 2023-09-05 MED ORDER — SODIUM CHLORIDE 0.9 % IV SOLN: INTRAVENOUS | Status: DC

## 2023-09-05 MED ORDER — PALONOSETRON HCL INJECTION 0.25 MG/5ML
0.2500 mg | Freq: Once | INTRAVENOUS | Status: AC
  Administered 2023-09-05: 0.25 mg via INTRAVENOUS
  Filled 2023-09-05: qty 5

## 2023-09-05 MED ORDER — SODIUM CHLORIDE 0.9 % IV SOLN
150.0000 mg | Freq: Once | INTRAVENOUS | Status: AC
  Administered 2023-09-05: 150 mg via INTRAVENOUS
  Filled 2023-09-05: qty 150

## 2023-09-05 MED ORDER — PACLITAXEL PROTEIN-BOUND CHEMO INJECTION 100 MG
156.0000 mg/m2 | Freq: Once | INTRAVENOUS | Status: AC
  Administered 2023-09-05: 400 mg via INTRAVENOUS
  Filled 2023-09-05: qty 80

## 2023-09-05 MED ORDER — PACLITAXEL PROTEIN-BOUND CHEMO INJECTION 100 MG
156.0000 mg/m2 | Freq: Once | INTRAVENOUS | Status: DC
  Filled 2023-09-05: qty 80

## 2023-09-05 MED ORDER — SODIUM CHLORIDE 0.9 % IV SOLN
480.0000 mg | Freq: Once | INTRAVENOUS | Status: AC
  Administered 2023-09-05: 480 mg via INTRAVENOUS
  Filled 2023-09-05: qty 48

## 2023-09-05 NOTE — Patient Instructions (Signed)
 Carboplatin  Injection What is this medication? CARBOPLATIN  (KAR boe pla tin) treats some types of cancer. It works by slowing down the growth of cancer cells. This medicine may be used for other purposes; ask your health care provider or pharmacist if you have questions. COMMON BRAND NAME(S): Paraplatin  What should I tell my care team before I take this medication? They need to know if you have any of these conditions: Blood disorders Hearing problems Kidney disease Recent or ongoing radiation therapy An unusual or allergic reaction to carboplatin , cisplatin, other medications, foods, dyes, or preservatives Pregnant or trying to get pregnant Breast-feeding How should I use this medication? This medication is injected into a vein. It is given by your care team in a hospital or clinic setting. Talk to your care team about the use of this medication in children. Special care may be needed. Overdosage: If you think you have taken too much of this medicine contact a poison control center or emergency room at once. NOTE: This medicine is only for you. Do not share this medicine with others. What if I miss a dose? Keep appointments for follow-up doses. It is important not to miss your dose. Call your care team if you are unable to keep an appointment. What may interact with this medication? Medications for seizures Some antibiotics, such as amikacin, gentamicin, neomycin, streptomycin, tobramycin Vaccines This list may not describe all possible interactions. Give your health care provider a list of all the medicines, herbs, non-prescription drugs, or dietary supplements you use. Also tell them if you smoke, drink alcohol, or use illegal drugs. Some items may interact with your medicine. What should I watch for while using this medication? Your condition will be monitored carefully while you are receiving this medication. You may need blood work while taking this medication. This medication may  make you feel generally unwell. This is not uncommon, as chemotherapy can affect healthy cells as well as cancer cells. Report any side effects. Continue your course of treatment even though you feel ill unless your care team tells you to stop. In some cases, you may be given additional medications to help with side effects. Follow all directions for their use. This medication may increase your risk of getting an infection. Call your care team for advice if you get a fever, chills, sore throat, or other symptoms of a cold or flu. Do not treat yourself. Try to avoid being around people who are sick. Avoid taking medications that contain aspirin, acetaminophen , ibuprofen, naproxen, or ketoprofen unless instructed by your care team. These medications may hide a fever. Be careful brushing or flossing your teeth or using a toothpick because you may get an infection or bleed more easily. If you have any dental work done, tell your dentist you are receiving this medication. Talk to your care team if you wish to become pregnant or think you might be pregnant. This medication can cause serious birth defects. Talk to your care team about effective forms of contraception. Do not breast-feed while taking this medication. What side effects may I notice from receiving this medication? Side effects that you should report to your care team as soon as possible: Allergic reactions--skin rash, itching, hives, swelling of the face, lips, tongue, or throat Infection--fever, chills, cough, sore throat, wounds that don't heal, pain or trouble when passing urine, general feeling of discomfort or being unwell Low red blood cell level--unusual weakness or fatigue, dizziness, headache, trouble breathing Pain, tingling, or numbness in the hands or  feet, muscle weakness, change in vision, confusion or trouble speaking, loss of balance or coordination, trouble walking, seizures Unusual bruising or bleeding Side effects that usually  do not require medical attention (report to your care team if they continue or are bothersome): Hair loss Nausea Unusual weakness or fatigue Vomiting This list may not describe all possible side effects. Call your doctor for medical advice about side effects. You may report side effects to FDA at 1-800-FDA-1088. Where should I keep my medication? This medication is given in a hospital or clinic. It will not be stored at home. NOTE: This sheet is a summary. It may not cover all possible information. If you have questions about this medicine, talk to your doctor, pharmacist, or health care provider.  2024 Elsevier/Gold Standard (2021-07-07 00:00:00)Paclitaxel  Nanoparticle Albumin -Bound Injection What is this medication? NANOPARTICLE ALBUMIN -BOUND PACLITAXEL  (Na no PAHR ti kuhl al BYOO muhn-bound PAK li TAX el) treats some types of cancer. It works by slowing down the growth of cancer cells. This medicine may be used for other purposes; ask your health care provider or pharmacist if you have questions. COMMON BRAND NAME(S): Abraxane  What should I tell my care team before I take this medication? They need to know if you have any of these conditions: Liver disease Low white blood cell levels An unusual or allergic reaction to paclitaxel , albumin , other medications, foods, dyes, or preservatives If you or your partner are pregnant or trying to get pregnant Breast-feeding How should I use this medication? This medication is injected into a vein. It is given by your care team in a hospital or clinic setting. Talk to your care team about the use of this medication in children. Special care may be needed. Overdosage: If you think you have taken too much of this medicine contact a poison control center or emergency room at once. NOTE: This medicine is only for you. Do not share this medicine with others. What if I miss a dose? Keep appointments for follow-up doses. It is important not to miss your  dose. Call your care team if you are unable to keep an appointment. What may interact with this medication? Other medications may affect the way this medication works. Talk with your care team about all of the medications you take. They may suggest changes to your treatment plan to lower the risk of side effects and to make sure your medications work as intended. This list may not describe all possible interactions. Give your health care provider a list of all the medicines, herbs, non-prescription drugs, or dietary supplements you use. Also tell them if you smoke, drink alcohol, or use illegal drugs. Some items may interact with your medicine. What should I watch for while using this medication? Your condition will be monitored carefully while you are receiving this medication. You may need blood work while taking this medication. This medication may make you feel generally unwell. This is not uncommon as chemotherapy can affect healthy cells as well as cancer cells. Report any side effects. Continue your course of treatment even though you feel ill unless your care team tells you to stop. This medication can cause serious allergic reactions. To reduce the risk, your care team may give you other medications to take before receiving this one. Be sure to follow the directions from your care team. This medication may increase your risk of getting an infection. Call your care team for advice if you get a fever, chills, sore throat, or other symptoms of a  cold or flu. Do not treat yourself. Try to avoid being around people who are sick. This medication may increase your risk to bruise or bleed. Call your care team if you notice any unusual bleeding. Be careful brushing or flossing your teeth or using a toothpick because you may get an infection or bleed more easily. If you have any dental work done, tell your dentist you are receiving this medication. Talk to your care team if you or your partner may be  pregnant. Serious birth defects can occur if you take this medication during pregnancy and for 6 months after the last dose. You will need a negative pregnancy test before starting this medication. Contraception is recommended while taking this medication and for 6 months after the last dose. Your care team can help you find the option that works for you. If your partner can get pregnant, use a condom during sex while taking this medication and for 3 months after the last dose. Do not breastfeed while taking this medication and for 2 weeks after the last dose. This medication may cause infertility. Talk to your care team if you are concerned about your fertility. What side effects may I notice from receiving this medication? Side effects that you should report to your care team as soon as possible: Allergic reactions--skin rash, itching, hives, swelling of the face, lips, tongue, or throat Dry cough, shortness of breath or trouble breathing Infection--fever, chills, cough, sore throat, wounds that don't heal, pain or trouble when passing urine, general feeling of discomfort or being unwell Low red blood cell level--unusual weakness or fatigue, dizziness, headache, trouble breathing Pain, tingling, or numbness in the hands or feet Stomach pain, unusual weakness or fatigue, nausea, vomiting, diarrhea, or fever that lasts longer than expected Unusual bruising or bleeding Side effects that usually do not require medical attention (report to your care team if they continue or are bothersome): Diarrhea Fatigue Hair loss Loss of appetite Nausea Vomiting This list may not describe all possible side effects. Call your doctor for medical advice about side effects. You may report side effects to FDA at 1-800-FDA-1088. Where should I keep my medication? This medication is given in a hospital or clinic. It will not be stored at home. NOTE: This sheet is a summary. It may not cover all possible information.  If you have questions about this medicine, talk to your doctor, pharmacist, or health care provider.  2024 Elsevier/Gold Standard (2021-07-30 00:00:00)

## 2023-09-06 ENCOUNTER — Inpatient Hospital Stay

## 2023-09-06 ENCOUNTER — Other Ambulatory Visit: Payer: Self-pay

## 2023-09-06 ENCOUNTER — Telehealth: Payer: Self-pay

## 2023-09-06 NOTE — Telephone Encounter (Signed)
 I called pt to see how she is feeling today, since switching to Abraxane  (from taxol )? Pt replied, " So far it has been wonderful. Today, I feel like I could run a 5K marathon if my legs would let me. Big difference from last time". She denies N/V, cough, SOB, chills, fever, skin rash, itching, mouth sores, numbness/tingling in hands and feet,and swelling in lower extremities. She had a little diarrhea, but took Imodium and was relieved. I confirmed she has Claritin  to start taking, in the morning before coming in for WBC injection. I reminded her of the importance of calling us  if she were to develop temp of 100.4 or higher, day or night.

## 2023-09-07 ENCOUNTER — Inpatient Hospital Stay

## 2023-09-07 VITALS — BP 117/75 | HR 81 | Temp 98.1°F | Resp 18

## 2023-09-07 DIAGNOSIS — C3401 Malignant neoplasm of right main bronchus: Secondary | ICD-10-CM

## 2023-09-07 DIAGNOSIS — Z5111 Encounter for antineoplastic chemotherapy: Secondary | ICD-10-CM | POA: Diagnosis not present

## 2023-09-07 MED ORDER — PEGFILGRASTIM-CBQV 6 MG/0.6ML ~~LOC~~ SOSY
6.0000 mg | PREFILLED_SYRINGE | Freq: Once | SUBCUTANEOUS | Status: AC
  Administered 2023-09-07: 6 mg via SUBCUTANEOUS
  Filled 2023-09-07: qty 0.6

## 2023-09-07 NOTE — Patient Instructions (Signed)

## 2023-09-23 ENCOUNTER — Encounter: Payer: Self-pay | Admitting: Oncology

## 2023-09-25 NOTE — Progress Notes (Unsigned)
 [facility]  [address] [place],  [place]  [address] [phone]  Clinic Day: 09/05/2023  Referring physician: Sabas Susan PARAS., MD   HISTORY OF PRESENT ILLNESS:  The patient is a 63 y.o. female with stage IVB (T2a N2a M1c1) squamous cell lung cancer, which includes spread of disease to her liver.  She comes in today to be evaluated before heading into her 6th cycle of palliative chemotherapy.  Of note, the patient had an allergic reaction to paclitaxel  after her fourth cycle of treatment.  Based upon this, she is being switched to carboplatin /Abraxane  with cycle #5.  Overall, the patient has been doing very well over these past few weeks.  She denies having any shortness of breath, hemoptysis or other respiratory issues which concern her for overt signs of disease progression.    PHYSICAL EXAM:  Last menstrual period 11/15/2011. Wt Readings from Last 3 Encounters:  09/05/23 256 lb 8 oz (116.3 kg)  08/15/23 255 lb 12.8 oz (116 kg)  07/07/23 254 lb 4.8 oz (115.3 kg)   There is no height or weight on file to calculate BMI. Performance status (ECOG): 2 - Symptomatic, <50% confined to bed Physical Exam Constitutional:      Appearance: Normal appearance. She is not ill-appearing.     Comments: A pleasant woman in a wheelchair.  She looks stronger vs previous visits.  HENT:     Mouth/Throat:     Mouth: Mucous membranes are moist.     Pharynx: Oropharynx is clear. No oropharyngeal exudate or posterior oropharyngeal erythema.   Cardiovascular:     Rate and Rhythm: Normal rate and regular rhythm.     Heart sounds: No murmur heard.    No friction rub. No gallop.  Pulmonary:     Effort: Pulmonary effort is normal. No respiratory distress.     Breath sounds: Normal breath sounds. No wheezing, rhonchi or rales.  Abdominal:     General: Bowel sounds are normal. There is no distension.     Palpations: Abdomen is soft. There is no mass.     Tenderness:  There is no abdominal tenderness.   Musculoskeletal:        General: No swelling.     Right lower leg: No edema.     Left lower leg: No edema.  Lymphadenopathy:     Cervical: No cervical adenopathy.     Upper Body:     Right upper body: No supraclavicular or axillary adenopathy.     Left upper body: No supraclavicular or axillary adenopathy.     Lower Body: No right inguinal adenopathy. No left inguinal adenopathy.   Skin:    General: Skin is warm.     Coloration: Skin is not jaundiced.     Findings: No lesion or rash.   Neurological:     General: No focal deficit present.     Mental Status: She is alert and oriented to person, place, and time. Mental status is at baseline.   Psychiatric:        Mood and Affect: Mood normal.        Behavior: Behavior normal.        Thought Content: Thought content normal.   LABS:      Latest Ref Rng & Units 09/05/2023    8:58 AM 08/15/2023    9:14 AM 07/29/2023   10:22 AM  CBC  WBC 4.0 - 10.5 K/uL 5.1  7.8  5.8   Hemoglobin 12.0 - 15.0  g/dL 89.1  89.6  9.2   Hematocrit 36.0 - 46.0 % 34.6  32.2  28.5   Platelets 150 - 400 K/uL 284  276  306       Latest Ref Rng & Units 09/05/2023    8:58 AM 08/15/2023    9:14 AM 07/07/2023    9:09 AM  CMP  Glucose 70 - 99 mg/dL 846  849  778   BUN 8 - 23 mg/dL 6  9  9    Creatinine 0.44 - 1.00 mg/dL 9.25  9.26  9.33   Sodium 135 - 145 mmol/L 135  136  131   Potassium 3.5 - 5.1 mmol/L 3.4  3.8  4.0   Chloride 98 - 111 mmol/L 97  98  96   CO2 22 - 32 mmol/L 25  25  23    Calcium 8.9 - 10.3 mg/dL 89.2  89.7  89.2   Total Protein 6.5 - 8.1 g/dL 7.5  7.8  8.3   Total Bilirubin 0.0 - 1.2 mg/dL 0.3  0.3  0.3   Alkaline Phos 38 - 126 U/L 155  152  138   AST 15 - 41 U/L 18  14  16    ALT 0 - 44 U/L 14  8  8      ASSESSMENT & PLAN:  Assessment/Plan:  A 63 y.o. female with stage IVB (T2a N2a M1c1) squamous cell lung cancer, which includes spread of disease to her liver.  She we will proceed with her fifth cycle  of palliative chemotherapy today, which will now consist of carboplatin /Abraxane .  Clinically, the patient appears to be doing very well.  I will see her back in 3 weeks before she has into her 6th and final cycle of palliative chemotherapy.  The patient understands all the plans discussed today and is in agreement with them.    Susan Ceja DELENA Kerns, MD

## 2023-09-26 ENCOUNTER — Inpatient Hospital Stay

## 2023-09-26 ENCOUNTER — Telehealth: Payer: Self-pay | Admitting: Oncology

## 2023-09-26 ENCOUNTER — Inpatient Hospital Stay (HOSPITAL_BASED_OUTPATIENT_CLINIC_OR_DEPARTMENT_OTHER): Admitting: Oncology

## 2023-09-26 ENCOUNTER — Other Ambulatory Visit: Payer: Self-pay | Admitting: Oncology

## 2023-09-26 VITALS — BP 123/99 | HR 94 | Temp 98.4°F | Resp 16 | Ht 72.0 in | Wt 267.4 lb

## 2023-09-26 DIAGNOSIS — C3401 Malignant neoplasm of right main bronchus: Secondary | ICD-10-CM

## 2023-09-26 DIAGNOSIS — Z5111 Encounter for antineoplastic chemotherapy: Secondary | ICD-10-CM | POA: Diagnosis not present

## 2023-09-26 LAB — CBC WITH DIFFERENTIAL (CANCER CENTER ONLY)
Abs Immature Granulocytes: 0.05 10*3/uL (ref 0.00–0.07)
Basophils Absolute: 0 10*3/uL (ref 0.0–0.1)
Basophils Relative: 1 %
Eosinophils Absolute: 0 10*3/uL (ref 0.0–0.5)
Eosinophils Relative: 0 %
HCT: 32.8 % — ABNORMAL LOW (ref 36.0–46.0)
Hemoglobin: 10.5 g/dL — ABNORMAL LOW (ref 12.0–15.0)
Immature Granulocytes: 1 %
Lymphocytes Relative: 19 %
Lymphs Abs: 0.9 10*3/uL (ref 0.7–4.0)
MCH: 29.4 pg (ref 26.0–34.0)
MCHC: 32 g/dL (ref 30.0–36.0)
MCV: 91.9 fL (ref 80.0–100.0)
Monocytes Absolute: 0.5 10*3/uL (ref 0.1–1.0)
Monocytes Relative: 10 %
Neutro Abs: 3.2 10*3/uL (ref 1.7–7.7)
Neutrophils Relative %: 69 %
Platelet Count: 201 10*3/uL (ref 150–400)
RBC: 3.57 MIL/uL — ABNORMAL LOW (ref 3.87–5.11)
RDW: 15.1 % (ref 11.5–15.5)
WBC Count: 4.6 10*3/uL (ref 4.0–10.5)
nRBC: 0 % (ref 0.0–0.2)

## 2023-09-26 LAB — CMP (CANCER CENTER ONLY)
ALT: 10 U/L (ref 0–44)
AST: 19 U/L (ref 15–41)
Albumin: 4.1 g/dL (ref 3.5–5.0)
Alkaline Phosphatase: 157 U/L — ABNORMAL HIGH (ref 38–126)
Anion gap: 14 (ref 5–15)
BUN: 6 mg/dL — ABNORMAL LOW (ref 8–23)
CO2: 25 mmol/L (ref 22–32)
Calcium: 9.9 mg/dL (ref 8.9–10.3)
Chloride: 97 mmol/L — ABNORMAL LOW (ref 98–111)
Creatinine: 0.65 mg/dL (ref 0.44–1.00)
GFR, Estimated: >60 mL/min (ref >60)
Glucose, Bld: 152 mg/dL — ABNORMAL HIGH (ref 70–99)
Potassium: 3.6 mmol/L (ref 3.5–5.1)
Sodium: 136 mmol/L (ref 135–145)
Total Bilirubin: 0.2 mg/dL (ref 0.0–1.2)
Total Protein: 7.4 g/dL (ref 6.5–8.1)

## 2023-09-26 LAB — MAGNESIUM: Magnesium: 1.5 mg/dL — ABNORMAL LOW (ref 1.7–2.4)

## 2023-09-26 MED ORDER — DEXAMETHASONE SODIUM PHOSPHATE 10 MG/ML IJ SOLN
10.0000 mg | Freq: Once | INTRAMUSCULAR | Status: AC
  Administered 2023-09-26: 10 mg via INTRAVENOUS
  Filled 2023-09-26: qty 1

## 2023-09-26 MED ORDER — SODIUM CHLORIDE 0.9 % IV SOLN: INTRAVENOUS | Status: DC

## 2023-09-26 MED ORDER — SODIUM CHLORIDE 0.9 % IV SOLN
480.0000 mg | Freq: Once | INTRAVENOUS | Status: AC
  Administered 2023-09-26: 480 mg via INTRAVENOUS
  Filled 2023-09-26: qty 48

## 2023-09-26 MED ORDER — FLUCONAZOLE 100 MG PO TABS
ORAL_TABLET | ORAL | 0 refills | Status: DC
Start: 2023-09-26 — End: 2023-11-08

## 2023-09-26 MED ORDER — SODIUM CHLORIDE 0.9 % IV SOLN
150.0000 mg | Freq: Once | INTRAVENOUS | Status: AC
  Administered 2023-09-26: 150 mg via INTRAVENOUS
  Filled 2023-09-26: qty 150

## 2023-09-26 MED ORDER — PALONOSETRON HCL INJECTION 0.25 MG/5ML
0.2500 mg | Freq: Once | INTRAVENOUS | Status: AC
  Administered 2023-09-26: 0.25 mg via INTRAVENOUS
  Filled 2023-09-26: qty 5

## 2023-09-26 MED ORDER — FAMOTIDINE IN NACL 20-0.9 MG/50ML-% IV SOLN
20.0000 mg | Freq: Once | INTRAVENOUS | Status: AC
  Administered 2023-09-26: 20 mg via INTRAVENOUS
  Filled 2023-09-26: qty 50

## 2023-09-26 MED ORDER — HEPARIN SOD (PORK) LOCK FLUSH 100 UNIT/ML IV SOLN
500.0000 [IU] | Freq: Once | INTRAVENOUS | Status: AC | PRN
  Administered 2023-09-26: 500 [IU]

## 2023-09-26 MED ORDER — DIPHENHYDRAMINE HCL 50 MG/ML IJ SOLN
25.0000 mg | Freq: Once | INTRAMUSCULAR | Status: AC
  Administered 2023-09-26: 25 mg via INTRAVENOUS
  Filled 2023-09-26: qty 1

## 2023-09-26 MED ORDER — PACLITAXEL PROTEIN-BOUND CHEMO INJECTION 100 MG
156.0000 mg/m2 | Freq: Once | INTRAVENOUS | Status: AC
  Administered 2023-09-26: 400 mg via INTRAVENOUS
  Filled 2023-09-26: qty 80

## 2023-09-26 MED ORDER — SODIUM CHLORIDE 0.9% FLUSH
10.0000 mL | INTRAVENOUS | Status: DC | PRN
  Administered 2023-09-26: 10 mL

## 2023-09-26 MED ORDER — MAGNESIUM SULFATE 2 GM/50ML IV SOLN
2.0000 g | Freq: Once | INTRAVENOUS | Status: AC
  Administered 2023-09-26: 2 g via INTRAVENOUS
  Filled 2023-09-26: qty 50

## 2023-09-26 NOTE — Patient Instructions (Signed)
Carboplatin Injection What is this medication? CARBOPLATIN (KAR boe pla tin) treats some types of cancer. It works by slowing down the growth of cancer cells. This medicine may be used for other purposes; ask your health care provider or pharmacist if you have questions. COMMON BRAND NAME(S): Paraplatin What should I tell my care team before I take this medication? They need to know if you have any of these conditions: Blood disorders Hearing problems Kidney disease Recent or ongoing radiation therapy An unusual or allergic reaction to carboplatin, cisplatin, other medications, foods, dyes, or preservatives Pregnant or trying to get pregnant Breast-feeding How should I use this medication? This medication is injected into a vein. It is given by your care team in a hospital or clinic setting. Talk to your care team about the use of this medication in children. Special care may be needed. Overdosage: If you think you have taken too much of this medicine contact a poison control center or emergency room at once. NOTE: This medicine is only for you. Do not share this medicine with others. What if I miss a dose? Keep appointments for follow-up doses. It is important not to miss your dose. Call your care team if you are unable to keep an appointment. What may interact with this medication? Medications for seizures Some antibiotics, such as amikacin, gentamicin, neomycin, streptomycin, tobramycin Vaccines This list may not describe all possible interactions. Give your health care provider a list of all the medicines, herbs, non-prescription drugs, or dietary supplements you use. Also tell them if you smoke, drink alcohol, or use illegal drugs. Some items may interact with your medicine. What should I watch for while using this medication? Your condition will be monitored carefully while you are receiving this medication. You may need blood work while taking this medication. This medication may  make you feel generally unwell. This is not uncommon, as chemotherapy can affect healthy cells as well as cancer cells. Report any side effects. Continue your course of treatment even though you feel ill unless your care team tells you to stop. In some cases, you may be given additional medications to help with side effects. Follow all directions for their use. This medication may increase your risk of getting an infection. Call your care team for advice if you get a fever, chills, sore throat, or other symptoms of a cold or flu. Do not treat yourself. Try to avoid being around people who are sick. Avoid taking medications that contain aspirin, acetaminophen, ibuprofen, naproxen, or ketoprofen unless instructed by your care team. These medications may hide a fever. Be careful brushing or flossing your teeth or using a toothpick because you may get an infection or bleed more easily. If you have any dental work done, tell your dentist you are receiving this medication. Talk to your care team if you wish to become pregnant or think you might be pregnant. This medication can cause serious birth defects. Talk to your care team about effective forms of contraception. Do not breast-feed while taking this medication. What side effects may I notice from receiving this medication? Side effects that you should report to your care team as soon as possible: Allergic reactions--skin rash, itching, hives, swelling of the face, lips, tongue, or throat Infection--fever, chills, cough, sore throat, wounds that don't heal, pain or trouble when passing urine, general feeling of discomfort or being unwell Low red blood cell level--unusual weakness or fatigue, dizziness, headache, trouble breathing Pain, tingling, or numbness in the hands or  feet, muscle weakness, change in vision, confusion or trouble speaking, loss of balance or coordination, trouble walking, seizures Unusual bruising or bleeding Side effects that usually  do not require medical attention (report to your care team if they continue or are bothersome): Hair loss Nausea Unusual weakness or fatigue Vomiting This list may not describe all possible side effects. Call your doctor for medical advice about side effects. You may report side effects to FDA at 1-800-FDA-1088. Where should I keep my medication? This medication is given in a hospital or clinic. It will not be stored at home. NOTE: This sheet is a summary. It may not cover all possible information. If you have questions about this medicine, talk to your doctor, pharmacist, or health care provider.  2024 Elsevier/Gold Standard (2021-07-07 00:00:00) Paclitaxel Injection What is this medication? PACLITAXEL (PAK li TAX el) treats some types of cancer. It works by slowing down the growth of cancer cells. This medicine may be used for other purposes; ask your health care provider or pharmacist if you have questions. COMMON BRAND NAME(S): Onxol, Taxol What should I tell my care team before I take this medication? They need to know if you have any of these conditions: Heart disease Liver disease Low white blood cell levels An unusual or allergic reaction to paclitaxel, other medications, foods, dyes, or preservatives If you or your partner are pregnant or trying to get pregnant Breast-feeding How should I use this medication? This medication is injected into a vein. It is given by your care team in a hospital or clinic setting. Talk to your care team about the use of this medication in children. While it may be given to children for selected conditions, precautions do apply. Overdosage: If you think you have taken too much of this medicine contact a poison control center or emergency room at once. NOTE: This medicine is only for you. Do not share this medicine with others. What if I miss a dose? Keep appointments for follow-up doses. It is important not to miss your dose. Call your care team if  you are unable to keep an appointment. What may interact with this medication? Do not take this medication with any of the following: Live virus vaccines Other medications may affect the way this medication works. Talk with your care team about all of the medications you take. They may suggest changes to your treatment plan to lower the risk of side effects and to make sure your medications work as intended. This list may not describe all possible interactions. Give your health care provider a list of all the medicines, herbs, non-prescription drugs, or dietary supplements you use. Also tell them if you smoke, drink alcohol, or use illegal drugs. Some items may interact with your medicine. What should I watch for while using this medication? Your condition will be monitored carefully while you are receiving this medication. You may need blood work while taking this medication. This medication may make you feel generally unwell. This is not uncommon as chemotherapy can affect healthy cells as well as cancer cells. Report any side effects. Continue your course of treatment even though you feel ill unless your care team tells you to stop. This medication can cause serious allergic reactions. To reduce the risk, your care team may give you other medications to take before receiving this one. Be sure to follow the directions from your care team. This medication may increase your risk of getting an infection. Call your care team for advice if  you get a fever, chills, sore throat, or other symptoms of a cold or flu. Do not treat yourself. Try to avoid being around people who are sick. This medication may increase your risk to bruise or bleed. Call your care team if you notice any unusual bleeding. Be careful brushing or flossing your teeth or using a toothpick because you may get an infection or bleed more easily. If you have any dental work done, tell your dentist you are receiving this medication. Talk to  your care team if you may be pregnant. Serious birth defects can occur if you take this medication during pregnancy. Talk to your care team before breastfeeding. Changes to your treatment plan may be needed. What side effects may I notice from receiving this medication? Side effects that you should report to your care team as soon as possible: Allergic reactions--skin rash, itching, hives, swelling of the face, lips, tongue, or throat Heart rhythm changes--fast or irregular heartbeat, dizziness, feeling faint or lightheaded, chest pain, trouble breathing Increase in blood pressure Infection--fever, chills, cough, sore throat, wounds that don't heal, pain or trouble when passing urine, general feeling of discomfort or being unwell Low blood pressure--dizziness, feeling faint or lightheaded, blurry vision Low red blood cell level--unusual weakness or fatigue, dizziness, headache, trouble breathing Painful swelling, warmth, or redness of the skin, blisters or sores at the infusion site Pain, tingling, or numbness in the hands or feet Slow heartbeat--dizziness, feeling faint or lightheaded, confusion, trouble breathing, unusual weakness or fatigue Unusual bruising or bleeding Side effects that usually do not require medical attention (report to your care team if they continue or are bothersome): Diarrhea Hair loss Joint pain Loss of appetite Muscle pain Nausea Vomiting This list may not describe all possible side effects. Call your doctor for medical advice about side effects. You may report side effects to FDA at 1-800-FDA-1088. Where should I keep my medication? This medication is given in a hospital or clinic. It will not be stored at home. NOTE: This sheet is a summary. It may not cover all possible information. If you have questions about this medicine, talk to your doctor, pharmacist, or health care provider.  2024 Elsevier/Gold Standard (2021-08-04 00:00:00)

## 2023-09-26 NOTE — Telephone Encounter (Signed)
 Patient has been scheduled for follow-up visit per 09/26/23 LOS.  Pt given an appt calendar with date and time.

## 2023-09-27 ENCOUNTER — Other Ambulatory Visit (HOSPITAL_BASED_OUTPATIENT_CLINIC_OR_DEPARTMENT_OTHER): Admitting: Radiology

## 2023-09-27 ENCOUNTER — Other Ambulatory Visit: Payer: Self-pay

## 2023-09-28 ENCOUNTER — Inpatient Hospital Stay: Attending: Oncology

## 2023-09-28 VITALS — BP 120/86 | HR 84 | Temp 98.5°F | Resp 20

## 2023-09-28 DIAGNOSIS — C3401 Malignant neoplasm of right main bronchus: Secondary | ICD-10-CM | POA: Insufficient documentation

## 2023-09-28 DIAGNOSIS — Z452 Encounter for adjustment and management of vascular access device: Secondary | ICD-10-CM | POA: Insufficient documentation

## 2023-09-28 DIAGNOSIS — Z5189 Encounter for other specified aftercare: Secondary | ICD-10-CM | POA: Insufficient documentation

## 2023-09-28 MED ORDER — PEGFILGRASTIM-CBQV 6 MG/0.6ML ~~LOC~~ SOSY
6.0000 mg | PREFILLED_SYRINGE | Freq: Once | SUBCUTANEOUS | Status: AC
  Administered 2023-09-28: 6 mg via SUBCUTANEOUS
  Filled 2023-09-28: qty 0.6

## 2023-10-06 ENCOUNTER — Other Ambulatory Visit: Payer: Self-pay | Admitting: Oncology

## 2023-10-07 ENCOUNTER — Other Ambulatory Visit: Payer: Self-pay

## 2023-10-14 ENCOUNTER — Encounter (HOSPITAL_BASED_OUTPATIENT_CLINIC_OR_DEPARTMENT_OTHER): Payer: Self-pay | Admitting: Radiology

## 2023-10-19 ENCOUNTER — Ambulatory Visit (HOSPITAL_BASED_OUTPATIENT_CLINIC_OR_DEPARTMENT_OTHER)
Admission: RE | Admit: 2023-10-19 | Discharge: 2023-10-19 | Disposition: A | Discharge status: Home / Self Care | Source: Ambulatory Visit | Attending: Oncology | Admitting: Oncology

## 2023-10-19 ENCOUNTER — Inpatient Hospital Stay

## 2023-10-19 DIAGNOSIS — C3401 Malignant neoplasm of right main bronchus: Secondary | ICD-10-CM

## 2023-10-19 DIAGNOSIS — Z95828 Presence of other vascular implants and grafts: Secondary | ICD-10-CM

## 2023-10-19 MED ORDER — SODIUM CHLORIDE FLUSH 0.9 % IV SOLN
10.0000 mL | Freq: Once | INTRAVENOUS | Status: AC
  Administered 2023-10-19: 10 mL via INTRAVENOUS

## 2023-10-19 MED ORDER — IOHEXOL 300 MG/ML  SOLN
100.0000 mL | Freq: Once | INTRAMUSCULAR | Status: AC | PRN
  Administered 2023-10-19: 100 mL via INTRAVENOUS

## 2023-10-19 MED ORDER — HEPARIN SOD (PORK) LOCK FLUSH 100 UNIT/ML IV SOLN
500.0000 [IU] | Freq: Once | INTRAVENOUS | Status: AC
  Administered 2023-10-19: 500 [IU] via INTRAVENOUS

## 2023-10-21 ENCOUNTER — Ambulatory Visit: Admitting: Oncology

## 2023-10-31 NOTE — Progress Notes (Unsigned)
 [facility]  [address] [place],  [place]  [address] [phone]  Clinic Day: 11/01/2023  Referring physician: Sabas Norleen PARAS., MD   HISTORY OF PRESENT ILLNESS:  The patient is a 63 y.o. female with stage IVB (T2a N2a M1c1) squamous cell lung cancer, which includes spread of disease to her liver.  She comes in today to go over her CT scans to ascertain her new disease baseline after receiving 6 cycles of palliative chemotherapy.  Of note, the patient had an allergic reaction to paclitaxel  after her fourth cycle of treatment.  Based upon this, she was switched to carboplatin /Abraxane  with cycle #5.  Overall, the patient tolerated her 6th cycle of chemotherapy very well.  She denies having any shortness of breath, hemoptysis or other respiratory issues which concern her for overt signs of disease progression.    PHYSICAL EXAM:  Blood pressure 128/85, pulse 85, temperature 99.2 F (37.3 C), temperature source Oral, resp. rate 18, height 6' (1.829 m), weight 267 lb 8 oz (121.3 kg), last menstrual period 11/15/2011, SpO2 97%. Wt Readings from Last 3 Encounters:  11/01/23 267 lb 8 oz (121.3 kg)  09/26/23 267 lb 6.4 oz (121.3 kg)  09/05/23 256 lb 8 oz (116.3 kg)   Body mass index is 36.28 kg/m. Performance status (ECOG): 2 - Symptomatic, <50% confined to bed Physical Exam Constitutional:      Appearance: Normal appearance. She is not ill-appearing.     Comments: A pleasant woman in a wheelchair.  She looks stronger vs previous visits.  HENT:     Mouth/Throat:     Mouth: Mucous membranes are moist.     Pharynx: Oropharynx is clear. No oropharyngeal exudate or posterior oropharyngeal erythema.  Cardiovascular:     Rate and Rhythm: Normal rate and regular rhythm.     Heart sounds: No murmur heard.    No friction rub. No gallop.  Pulmonary:     Effort: Pulmonary effort is normal. No respiratory distress.     Breath sounds: Examination of the  right-lower field reveals wheezing. Wheezing present. No rhonchi or rales.  Abdominal:     General: Bowel sounds are normal. There is no distension.     Palpations: Abdomen is soft. There is no mass.     Tenderness: There is no abdominal tenderness.  Musculoskeletal:        General: No swelling.     Right lower leg: No edema.     Left lower leg: No edema.  Lymphadenopathy:     Cervical: No cervical adenopathy.     Upper Body:     Right upper body: No supraclavicular or axillary adenopathy.     Left upper body: No supraclavicular or axillary adenopathy.     Lower Body: No right inguinal adenopathy. No left inguinal adenopathy.  Skin:    General: Skin is warm.     Coloration: Skin is not jaundiced.     Findings: No lesion or rash.  Neurological:     General: No focal deficit present.     Mental Status: She is alert and oriented to person, place, and time. Mental status is at baseline.  Psychiatric:        Mood and Affect: Mood normal.        Behavior: Behavior normal.        Thought Content: Thought content normal.   SCANS:  CT scans of her chest/abdomen/pelvis done on 10/19/2023 revealed the following: FINDINGS: CT CHEST FINDINGS  Cardiovascular: The heart size is normal. No substantial pericardial effusion. Coronary artery calcification is evident. Mild atherosclerotic calcification is noted in the wall of the thoracic aorta. Right Port-A-Cath tip is positioned in the right atrium.   Mediastinum/Nodes: No mediastinal lymphadenopathy. There is no hilar lymphadenopathy. The esophagus has normal imaging features. There is no axillary lymphadenopathy.   Lungs/Pleura: Persistent consolidative opacity identified in the superior right lower lobe with associated volume loss. Attenuation of airways to the right lower lobe is similar to prior. Bandlike areas of irregular nodular consolidative disease in the medial anterior right lung are similar. Stable 9 mm left lower lobe  nodule on 93/302. No pulmonary edema or substantial pleural effusion.   Musculoskeletal: No worrisome lytic or sclerotic osseous abnormality. Nonacute fracture anterior right sixth rib is new in the interval since prior study.   CT ABDOMEN PELVIS FINDINGS   Hepatobiliary: No suspicious focal abnormality within the liver parenchyma. Previous PET-CT of 04/01/2023 identified four hypermetabolic liver lesions although no discrete suspicious liver lesion evident by CT imaging today. There is no evidence for gallstones, gallbladder wall thickening, or pericholecystic fluid. No intrahepatic or extrahepatic biliary dilation.   Pancreas: No focal mass lesion. No dilatation of the main duct. No intraparenchymal cyst. No peripancreatic edema.   Spleen: SIRT normal spleen   Adrenals/Urinary Tract: No adrenal nodule or mass. Tiny well-defined homogeneous low-density lesions in the right kidney are too small to characterize but statistically most likely benign and probably cysts. No followup imaging is recommended. Area of concern in the upper pole right kidney on the 07/28/2023 exam has decreased substantially in the interval measuring 11 x 10 mm today compared to 17 x 15 mm previously. No evidence for hydroureter. The urinary bladder appears normal for the degree of distention.   Stomach/Bowel: Stomach is decompressed. Duodenum is normally positioned as is the ligament of Treitz. No small bowel wall thickening. No small bowel dilatation. The terminal ileum is normal. The appendix is not well visualized, but there is no edema or inflammation in the region of the cecal tip to suggest appendicitis. No gross colonic mass. No colonic wall thickening.   Vascular/Lymphatic: There is mild atherosclerotic calcification of the abdominal aorta without aneurysm. There is no gastrohepatic or hepatoduodenal ligament lymphadenopathy. No retroperitoneal or mesenteric lymphadenopathy. No pelvic sidewall  lymphadenopathy.   Reproductive: Hysterectomy.  There is no adnexal mass.   Other: No intraperitoneal free fluid.   Musculoskeletal: No worrisome lytic or sclerotic osseous abnormality. Status post lumbar fusion from L2-L5 with degenerative changes at the L1-2 and L5-S1 disc spaces.   IMPRESSION: 1. Stable exam. No new or progressive findings in the chest. 2. Persistent consolidative opacity in the superior right lower lobe with associated volume loss. Attenuation of airways to the right lower lobe is similar to prior. Bandlike areas of irregular nodular consolidative disease in the medial anterior right lung are similar to prior. 3. Stable 9 mm left lower lobe nodule. Attention on follow-up recommended. 4. Previous PET-CT of 04/01/2023 identified four hypermetabolic liver lesions although no discrete suspicious liver lesion evident by CT imaging today. 5. Area of concern in the upper pole right kidney on the 07/28/2023 exam has decreased substantially in the interval measuring 11 x 10 mm today compared to 17 x 15 mm previously. 6. Nonacute fracture anterior right sixth rib is new in the interval since prior study. 7.  Aortic Atherosclerosis (ICD10-I70.0).   LABS:      Latest Ref Rng & Units 11/01/2023  11:30 AM 09/26/2023    9:39 AM 09/05/2023    8:58 AM  CBC  WBC 4.0 - 10.5 K/uL 13.2  4.6  5.1   Hemoglobin 12.0 - 15.0 g/dL 89.6  89.4  89.1   Hematocrit 36.0 - 46.0 % 31.4  32.8  34.6   Platelets 150 - 400 K/uL 266  201  284       Latest Ref Rng & Units 11/01/2023   11:30 AM 09/26/2023    9:39 AM 09/05/2023    8:58 AM  CMP  Glucose 70 - 99 mg/dL 863  847  846   BUN 8 - 23 mg/dL 6  6  6    Creatinine 0.44 - 1.00 mg/dL 9.48  9.34  9.25   Sodium 135 - 145 mmol/L 137  136  135   Potassium 3.5 - 5.1 mmol/L 3.6  3.6  3.4   Chloride 98 - 111 mmol/L 96  97  97   CO2 22 - 32 mmol/L 27  25  25    Calcium 8.9 - 10.3 mg/dL 9.3  9.9  89.2   Total Protein 6.5 - 8.1 g/dL 7.6  7.4  7.5    Total Bilirubin 0.0 - 1.2 mg/dL 0.5  0.2  0.3   Alkaline Phos 38 - 126 U/L 161  157  155   AST 15 - 41 U/L 18  19  18    ALT 0 - 44 U/L 7  10  14      ASSESSMENT & PLAN:  Assessment/Plan:  A 63 y.o. female with stage IVB (T2a N2a M1c1) squamous cell lung cancer, which includes spread of disease to her liver.  In clinic today, I went over all of her CT scan images with her, for which I continue to see no evidence of disease progression.  She still has an area of consolidation in the lower aspect of her right upper lobe.  I still would like for her to be seen by pulmonology to see if this area could potentially be reopened and prevent a future postobstructive pneumonia.  I am particularly pleased that her CT scans continue to show no visible evidence of metastatic liver disease.  Most of this visit today was focused around what to do next after her 6 cycles of platinum/taxane based chemotherapy.  Ultimately, we came to the decision to continue her on maintenance Abraxane , which she will receive once every 3 weeks.  Her first cycle of maintenance Abraxane  will commence on Thursday, August 7th.  I will see her back in 3 weeks before she heads into her second cycle of maintenance Abraxane .  The patient understands all the plans discussed today and is in agreement with them.    Jacelynn Hayton DELENA Kerns, MD

## 2023-11-01 ENCOUNTER — Encounter: Payer: Self-pay | Admitting: Oncology

## 2023-11-01 ENCOUNTER — Inpatient Hospital Stay

## 2023-11-01 ENCOUNTER — Ambulatory Visit

## 2023-11-01 ENCOUNTER — Inpatient Hospital Stay: Attending: Oncology | Admitting: Oncology

## 2023-11-01 ENCOUNTER — Other Ambulatory Visit: Payer: Self-pay | Admitting: Oncology

## 2023-11-01 VITALS — BP 128/85 | HR 85 | Temp 99.2°F | Resp 18 | Ht 72.0 in | Wt 267.5 lb

## 2023-11-01 DIAGNOSIS — Z5111 Encounter for antineoplastic chemotherapy: Secondary | ICD-10-CM | POA: Diagnosis present

## 2023-11-01 DIAGNOSIS — C787 Secondary malignant neoplasm of liver and intrahepatic bile duct: Secondary | ICD-10-CM | POA: Diagnosis not present

## 2023-11-01 DIAGNOSIS — E860 Dehydration: Secondary | ICD-10-CM | POA: Diagnosis not present

## 2023-11-01 DIAGNOSIS — C3401 Malignant neoplasm of right main bronchus: Secondary | ICD-10-CM

## 2023-11-01 DIAGNOSIS — R110 Nausea: Secondary | ICD-10-CM

## 2023-11-01 DIAGNOSIS — C3490 Malignant neoplasm of unspecified part of unspecified bronchus or lung: Secondary | ICD-10-CM | POA: Insufficient documentation

## 2023-11-01 LAB — CBC WITH DIFFERENTIAL (CANCER CENTER ONLY)
Abs Immature Granulocytes: 0.12 K/uL — ABNORMAL HIGH (ref 0.00–0.07)
Basophils Absolute: 0 K/uL (ref 0.0–0.1)
Basophils Relative: 0 %
Eosinophils Absolute: 0.1 K/uL (ref 0.0–0.5)
Eosinophils Relative: 1 %
HCT: 31.4 % — ABNORMAL LOW (ref 36.0–46.0)
Hemoglobin: 10.3 g/dL — ABNORMAL LOW (ref 12.0–15.0)
Immature Granulocytes: 1 %
Lymphocytes Relative: 6 %
Lymphs Abs: 0.7 K/uL (ref 0.7–4.0)
MCH: 29.9 pg (ref 26.0–34.0)
MCHC: 32.8 g/dL (ref 30.0–36.0)
MCV: 91 fL (ref 80.0–100.0)
Monocytes Absolute: 1.1 K/uL — ABNORMAL HIGH (ref 0.1–1.0)
Monocytes Relative: 8 %
Neutro Abs: 11.2 K/uL — ABNORMAL HIGH (ref 1.7–7.7)
Neutrophils Relative %: 84 %
Platelet Count: 266 K/uL (ref 150–400)
RBC: 3.45 MIL/uL — ABNORMAL LOW (ref 3.87–5.11)
RDW: 15.6 % — ABNORMAL HIGH (ref 11.5–15.5)
WBC Count: 13.2 K/uL — ABNORMAL HIGH (ref 4.0–10.5)
nRBC: 0 % (ref 0.0–0.2)

## 2023-11-01 LAB — CMP (CANCER CENTER ONLY)
ALT: 7 U/L (ref 0–44)
AST: 18 U/L (ref 15–41)
Albumin: 4.1 g/dL (ref 3.5–5.0)
Alkaline Phosphatase: 161 U/L — ABNORMAL HIGH (ref 38–126)
Anion gap: 14 (ref 5–15)
BUN: 6 mg/dL — ABNORMAL LOW (ref 8–23)
CO2: 27 mmol/L (ref 22–32)
Calcium: 9.3 mg/dL (ref 8.9–10.3)
Chloride: 96 mmol/L — ABNORMAL LOW (ref 98–111)
Creatinine: 0.51 mg/dL (ref 0.44–1.00)
GFR, Estimated: >60 mL/min (ref >60)
Glucose, Bld: 136 mg/dL — ABNORMAL HIGH (ref 70–99)
Potassium: 3.6 mmol/L (ref 3.5–5.1)
Sodium: 137 mmol/L (ref 135–145)
Total Bilirubin: 0.5 mg/dL (ref 0.0–1.2)
Total Protein: 7.6 g/dL (ref 6.5–8.1)

## 2023-11-01 LAB — MAGNESIUM: Magnesium: 1.3 mg/dL — ABNORMAL LOW (ref 1.7–2.4)

## 2023-11-01 MED ORDER — DEXAMETHASONE SODIUM PHOSPHATE 10 MG/ML IJ SOLN
10.0000 mg | Freq: Once | INTRAMUSCULAR | Status: AC
  Administered 2023-11-01: 10 mg via INTRAVENOUS
  Filled 2023-11-01: qty 1

## 2023-11-01 MED ORDER — SODIUM CHLORIDE 0.9 % IV SOLN
Freq: Once | INTRAVENOUS | Status: DC
Start: 2023-11-01 — End: 2023-11-01

## 2023-11-01 NOTE — Patient Instructions (Signed)
 Dexamethasone Injection What is this medication? DEXAMETHASONE (dex a METH a sone) treats many conditions such as asthma, allergic reactions, arthritis, inflammatory bowel diseases, adrenal, and blood or bone marrow disorders. It works by decreasing inflammation and slowing down an overactive immune system. It belongs to a group of medications called steroids. This medicine may be used for other purposes; ask your health care provider or pharmacist if you have questions. COMMON BRAND NAME(S): Decadron, DEX24, DoubleDex, ReadySharp Dexamethasone, Simplist Dexamethasone, Solurex What should I tell my care team before I take this medication? They need to know if you have any of these conditions: Cushing syndrome Diabetes Glaucoma Heart attack Heart disease High blood pressure Infection, such as tuberculosis (TB), bacterial, fungal, or viral infections Kidney disease Liver disease Mental health condition Myasthenia gravis Osteoporosis Seizures Stomach or intestine problems Thyroid disease An unusual or allergic reaction to dexamethasone, lactose, other medications, foods, dyes, or preservatives Pregnant or trying to get pregnant Breastfeeding How should I use this medication? This medication is injected into a muscle, joint, lesion, or other tissue. It is given by your care team in a hospital or clinic setting. Talk to your care team about the use of this medication in children. Special care may be needed. Overdosage: If you think you have taken too much of this medicine contact a poison control center or emergency room at once. NOTE: This medicine is only for you. Do not share this medicine with others. What if I miss a dose? This does not apply. What may interact with this medication? Do not take this medication with any of the following: Live virus vaccines This medication may also interact with the following: Aminoglutethimide Amphotericin B Aspirin and aspirin-like  medications Certain antibiotics, such as erythromycin, clarithromycin, troleandomycin Certain antivirals for HIV or hepatitis Certain medications for seizures, such as carbamazepine, phenobarbital, phenytoin Certain medications to treat myasthenia gravis Cholestyramine Cyclosporine Digoxin Diuretics Ephedrine Estrogen and progestin hormones Insulin or other medications for diabetes Isoniazid Ketoconazole Medications that relax muscles for surgery Mifepristone NSAIDs, medications for pain and inflammation, such as ibuprofen or naproxen Rifampin Skin tests for allergies Thalidomide Vaccines Warfarin This list may not describe all possible interactions. Give your health care provider a list of all the medicines, herbs, non-prescription drugs, or dietary supplements you use. Also tell them if you smoke, drink alcohol, or use illegal drugs. Some items may interact with your medicine. What should I watch for while using this medication? Visit your care team for regular checks on your progress. Tell your care team if your symptoms do not start to get better or if they get worse. Your condition will be monitored carefully while you are receiving this medication. Wear a medical ID bracelet or chain. Carry a card that describes your condition. List the medications and doses you take on the card. This medication may increase your risk of getting an infection. Call your care team for advice if you get a fever, chills, sore throat, or other symptoms of a cold or flu. Do not treat yourself. Try to avoid being around people who are sick. If you have not had the measles or chickenpox vaccines, tell your care team right away if you are around someone with these viruses. If you are going to need surgery or other procedure, tell your care team that you are using this medication. You may need to be on a special diet while you are taking this medication. Ask your care team. Also, find out how many glasses of  fluids you need to drink each day. This medication may increase blood sugar. The risk may be higher in patients who already have diabetes. Ask your care team what you can do to lower your risk of diabetes while taking this medication. What side effects may I notice from receiving this medication? Side effects that you should report to your care team as soon as possible: Allergic reactions--skin rash, itching, hives, swelling of the face, lips, tongue, or throat Cushing syndrome--increased fat around the midsection, upper back, neck, or face, pink or purple stretch marks on the skin, thinning, fragile skin that easily bruises, unexpected hair growth High blood sugar (hyperglycemia)--increased thirst or amount of urine, unusual weakness or fatigue, blurry vision Increase in blood pressure Infection--fever, chills, cough, sore throat, wounds that don't heal, pain or trouble when passing urine, general feeling of discomfort or being unwell Low adrenal gland function--nausea, vomiting, loss of appetite, unusual weakness or fatigue, dizziness Mood and behavior changes--anxiety, nervousness, confusion, hallucinations, irritability, hostility, thoughts of suicide or self-harm, worsening mood, feelings of depression Stomach bleeding--bloody or black, tar-like stools, vomiting blood or brown material that looks like coffee grounds Swelling of the ankles, hands, or feet Side effects that usually do not require medical attention (report to your care team if they continue or are bothersome): Acne General discomfort and fatigue Headache Increase in appetite Nausea Trouble sleeping Weight gain This list may not describe all possible side effects. Call your doctor for medical advice about side effects. You may report side effects to FDA at 1-800-FDA-1088. Where should I keep my medication? This medication is given in a hospital or clinic. It will not be stored at home. NOTE: This sheet is a summary. It may  not cover all possible information. If you have questions about this medicine, talk to your doctor, pharmacist, or health care provider.  2024 Elsevier/Gold Standard (2021-08-20 00:00:00)

## 2023-11-02 ENCOUNTER — Other Ambulatory Visit: Payer: Self-pay

## 2023-11-02 ENCOUNTER — Encounter: Payer: Self-pay | Admitting: Oncology

## 2023-11-03 ENCOUNTER — Inpatient Hospital Stay

## 2023-11-03 VITALS — BP 134/88 | HR 84 | Temp 98.2°F | Resp 20 | Ht 72.0 in | Wt 267.0 lb

## 2023-11-03 DIAGNOSIS — Z5111 Encounter for antineoplastic chemotherapy: Secondary | ICD-10-CM | POA: Diagnosis not present

## 2023-11-03 DIAGNOSIS — C3401 Malignant neoplasm of right main bronchus: Secondary | ICD-10-CM

## 2023-11-03 MED ORDER — PACLITAXEL PROTEIN-BOUND CHEMO INJECTION 100 MG
156.0000 mg/m2 | Freq: Once | INTRAVENOUS | Status: AC
  Administered 2023-11-03: 400 mg via INTRAVENOUS
  Filled 2023-11-03: qty 80

## 2023-11-03 MED ORDER — ONDANSETRON HCL 4 MG/2ML IJ SOLN
8.0000 mg | Freq: Once | INTRAMUSCULAR | Status: AC
  Administered 2023-11-03: 8 mg via INTRAVENOUS
  Filled 2023-11-03: qty 4

## 2023-11-03 MED ORDER — DEXAMETHASONE SODIUM PHOSPHATE 10 MG/ML IJ SOLN
10.0000 mg | Freq: Once | INTRAMUSCULAR | Status: AC
  Administered 2023-11-03: 10 mg via INTRAVENOUS
  Filled 2023-11-03: qty 1

## 2023-11-03 MED ORDER — SODIUM CHLORIDE 0.9 % IV SOLN: INTRAVENOUS | Status: DC

## 2023-11-03 NOTE — Patient Instructions (Signed)
Paclitaxel Nanoparticle Albumin-Bound Injection What is this medication? NANOPARTICLE ALBUMIN-BOUND PACLITAXEL (Na no PAHR ti kuhl al BYOO muhn-bound PAK li TAX el) treats some types of cancer. It works by slowing down the growth of cancer cells. This medicine may be used for other purposes; ask your health care provider or pharmacist if you have questions. COMMON BRAND NAME(S): Abraxane What should I tell my care team before I take this medication? They need to know if you have any of these conditions: Liver disease Low white blood cell levels An unusual or allergic reaction to paclitaxel, albumin, other medications, foods, dyes, or preservatives If you or your partner are pregnant or trying to get pregnant Breast-feeding How should I use this medication? This medication is injected into a vein. It is given by your care team in a hospital or clinic setting. Talk to your care team about the use of this medication in children. Special care may be needed. Overdosage: If you think you have taken too much of this medicine contact a poison control center or emergency room at once. NOTE: This medicine is only for you. Do not share this medicine with others. What if I miss a dose? Keep appointments for follow-up doses. It is important not to miss your dose. Call your care team if you are unable to keep an appointment. What may interact with this medication? Other medications may affect the way this medication works. Talk with your care team about all of the medications you take. They may suggest changes to your treatment plan to lower the risk of side effects and to make sure your medications work as intended. This list may not describe all possible interactions. Give your health care provider a list of all the medicines, herbs, non-prescription drugs, or dietary supplements you use. Also tell them if you smoke, drink alcohol, or use illegal drugs. Some items may interact with your medicine. What  should I watch for while using this medication? Your condition will be monitored carefully while you are receiving this medication. You may need blood work while taking this medication. This medication may make you feel generally unwell. This is not uncommon as chemotherapy can affect healthy cells as well as cancer cells. Report any side effects. Continue your course of treatment even though you feel ill unless your care team tells you to stop. This medication can cause serious allergic reactions. To reduce the risk, your care team may give you other medications to take before receiving this one. Be sure to follow the directions from your care team. This medication may increase your risk of getting an infection. Call your care team for advice if you get a fever, chills, sore throat, or other symptoms of a cold or flu. Do not treat yourself. Try to avoid being around people who are sick. This medication may increase your risk to bruise or bleed. Call your care team if you notice any unusual bleeding. Be careful brushing or flossing your teeth or using a toothpick because you may get an infection or bleed more easily. If you have any dental work done, tell your dentist you are receiving this medication. Talk to your care team if you or your partner may be pregnant. Serious birth defects can occur if you take this medication during pregnancy and for 6 months after the last dose. You will need a negative pregnancy test before starting this medication. Contraception is recommended while taking this medication and for 6 months after the last dose. Your care team   can help you find the option that works for you. If your partner can get pregnant, use a condom during sex while taking this medication and for 3 months after the last dose. Do not breastfeed while taking this medication and for 2 weeks after the last dose. This medication may cause infertility. Talk to your care team if you are concerned about your  fertility. What side effects may I notice from receiving this medication? Side effects that you should report to your care team as soon as possible: Allergic reactions--skin rash, itching, hives, swelling of the face, lips, tongue, or throat Dry cough, shortness of breath or trouble breathing Infection--fever, chills, cough, sore throat, wounds that don't heal, pain or trouble when passing urine, general feeling of discomfort or being unwell Low red blood cell level--unusual weakness or fatigue, dizziness, headache, trouble breathing Pain, tingling, or numbness in the hands or feet Stomach pain, unusual weakness or fatigue, nausea, vomiting, diarrhea, or fever that lasts longer than expected Unusual bruising or bleeding Side effects that usually do not require medical attention (report to your care team if they continue or are bothersome): Diarrhea Fatigue Hair loss Loss of appetite Nausea Vomiting This list may not describe all possible side effects. Call your doctor for medical advice about side effects. You may report side effects to FDA at 1-800-FDA-1088. Where should I keep my medication? This medication is given in a hospital or clinic. It will not be stored at home. NOTE: This sheet is a summary. It may not cover all possible information. If you have questions about this medicine, talk to your doctor, pharmacist, or health care provider.  2024 Elsevier/Gold Standard (2021-07-30 00:00:00)  

## 2023-11-03 NOTE — Progress Notes (Signed)
 Per Elzie Knisley phy, rph pt to start taking mag oxide 400mg  po 1 tablet bid.  Pt informed.  Verbalizes understanding.

## 2023-11-08 ENCOUNTER — Encounter: Payer: Self-pay | Admitting: Oncology

## 2023-11-08 ENCOUNTER — Ambulatory Visit (HOSPITAL_BASED_OUTPATIENT_CLINIC_OR_DEPARTMENT_OTHER)
Admission: EM | Admit: 2023-11-08 | Discharge: 2023-11-08 | Disposition: A | Discharge status: Home / Self Care | Attending: Family Medicine | Admitting: Family Medicine

## 2023-11-08 ENCOUNTER — Telehealth: Payer: Self-pay

## 2023-11-08 ENCOUNTER — Encounter (HOSPITAL_BASED_OUTPATIENT_CLINIC_OR_DEPARTMENT_OTHER): Payer: Self-pay

## 2023-11-08 ENCOUNTER — Other Ambulatory Visit (HOSPITAL_BASED_OUTPATIENT_CLINIC_OR_DEPARTMENT_OTHER): Payer: Self-pay

## 2023-11-08 DIAGNOSIS — R5381 Other malaise: Secondary | ICD-10-CM

## 2023-11-08 DIAGNOSIS — J029 Acute pharyngitis, unspecified: Secondary | ICD-10-CM | POA: Diagnosis not present

## 2023-11-08 DIAGNOSIS — R051 Acute cough: Secondary | ICD-10-CM | POA: Diagnosis not present

## 2023-11-08 DIAGNOSIS — B370 Candidal stomatitis: Secondary | ICD-10-CM | POA: Diagnosis not present

## 2023-11-08 DIAGNOSIS — R52 Pain, unspecified: Secondary | ICD-10-CM

## 2023-11-08 LAB — POC COVID19/FLU A&B COMBO
Covid Antigen, POC: NEGATIVE
Influenza A Antigen, POC: NEGATIVE
Influenza B Antigen, POC: NEGATIVE

## 2023-11-08 LAB — POCT RAPID STREP A (OFFICE): Rapid Strep A Screen: NEGATIVE

## 2023-11-08 MED ORDER — FLUCONAZOLE 100 MG PO TABS
ORAL_TABLET | ORAL | 1 refills | Status: DC
  Filled 2023-11-08: qty 7, 14d supply, fill #0
  Filled 2023-12-02: qty 7, 14d supply, fill #1

## 2023-11-08 MED ORDER — PREDNISOLONE SODIUM PHOSPHATE 15 MG/5ML PO SOLN
Freq: Four times a day (QID) | OROMUCOSAL | 0 refills | Status: DC
  Filled 2023-11-08: qty 250, 11d supply, fill #0

## 2023-11-08 MED ORDER — NYSTATIN 100000 UNIT/ML MT SUSP
OROMUCOSAL | 0 refills | Status: DC
  Filled 2023-11-08: qty 300, fill #0

## 2023-11-08 NOTE — Telephone Encounter (Addendum)
 Melissa,NP: Spoke with Urgent Care provider. Swabs are all negative. Did have some thrush, but mostly just feels icky. Can we bring her in for fluids this week?   Rae,RN: 1100 tomorrow  Melissa,NP: Great. I'll add fluids and can we get labs on her?    Yes, appt's given.

## 2023-11-08 NOTE — Telephone Encounter (Signed)
 Pt's sister had called & LVM on nurse line earlier this morning. I have returned call. She states, Susan Franco just feels awful. She has a sore throat (pt said in background that it is worse today than yesterday), she can't swallow very well, has a deep cough (non productive), muscle aches, & can't sleep. Afebrile. She denies mouth sores, and headache. She initially had some diarrhea, treated with Imodium so that has settled. Pt's sister also mentioned that she is a little anxious, even with use of the xanax  0.25mg  (prescribed by PCP I guess). Pt has taken tylenol  and used throat lozenges. She doesn't see any red spots or mouth sores. Please advise. Last tx 11/03/23

## 2023-11-08 NOTE — ED Triage Notes (Addendum)
 Sore throat onset last week before chemo. States feeling awful. Has joint pain due to her psoriatic arthritis. Received her chemo and a bag of steroids on Thursday. Patient has lung cancer with mets to liver and kidney. Patient right lung is partially collapsed and her providers are aware of this. If further information is needed, please call Melissa at [facility].

## 2023-11-08 NOTE — Discharge Instructions (Addendum)
 Oral thrush with cough, body aches sore throat and malaise: COVID, flu and strep test were all negative.  Will treat for thrush with fluconazole  100 mg daily for 7 days.  Also provided Magic mouthwash, swish and swallow, 4 times daily if needed.  No cause of her malaise was found on testing or exam.  I believe this may all be adjustments after her most recent chemotherapy.  Encouraged good fluid intake and rest.  Follow-up with oncology.  Return here as needed.

## 2023-11-08 NOTE — ED Provider Notes (Signed)
 [facility]    CSN: [id number] Arrival date & time: 11/08/23  1251      History   Chief Complaint Chief Complaint  Patient presents with   Sore Throat    HPI Susan Franco is a 63 y.o. female.   63 year old female who has metastatic cancer to the liver and kidney from her lungs.  She also has psoriatic arthritis.  She had completed her chemo and was trying to start immunotherapy when they determined that she was not able to take the immunotherapy they had planned.  She restarted chemo on Thursday, 11/03/2023.  She just feels awful.  She does not know if it is a chemotherapy or an illness.  She has bodyaches, she has sore throat, she has a cough and feels like she is congested.  She has had some nausea.  She denies vomiting, constipation, diarrhea.  She was referred here by the cancer center for testing for anything that might explain her symptoms beyond the chemotherapy and the cancer.   Sore Throat Pertinent negatives include no chest pain, no abdominal pain and no shortness of breath.    Past Medical History:  Diagnosis Date   Anemia    Anxiety    Depression    GERD (gastroesophageal reflux disease)    History of EKG 08/2011   was reflux-done at [facility] hospital-will request   Hypertension    atenolol  and norvasc    Lung cancer (HCC)    Psoriasis    Psoriatic arthritis (HCC) 2020   [facility]   Rheumatoid arthritis (HCC) 2017   Seasonal allergies    Smoker    SOB (shortness of breath)     Patient Active Problem List   Diagnosis Date Noted   Nausea without vomiting 06/15/2023   Cancer of hilus of right lung (HCC) 04/05/2023   Lumbar radiculopathy 09/11/2020   Tobacco use disorder 10/18/2013    Past Surgical History:  Procedure Laterality Date   ABDOMINAL HYSTERECTOMY     Laparoscopic total hysterectomy for fibroids and bilateral salpingenctomy   ANKLE SURGERY Left    X 3   COLONOSCOPY W/ POLYPECTOMY     CYSTOSCOPY  11/30/2011   Procedure: CYSTOSCOPY;   Surgeon: Montie SHAUNNA Chesterfield, MD;  Location: [facility];  Service: Gynecology;  Laterality: N/A;   DIAGNOSTIC LAPAROSCOPY  03/30/1983   ectopic   DILATION AND CURETTAGE OF UTERUS     TRANSFORAMINAL LUMBAR INTERBODY FUSION W/ MIS 2 LEVEL N/A 09/11/2020   Procedure: Minimally invasive fusion Lumbar four-five, Lumbar five-Sacral one;  Surgeon: Debby Dorn MATSU, MD;  Location[facility] OR;  Service: Neurosurgery;  Laterality: N/A;   TUBAL LIGATION     WISDOM TOOTH EXTRACTION      OB History     Gravida  5   Para  2   Term  2   Preterm      AB  3   Living  2      SAB  2   IAB      Ectopic  1   Multiple      Live Births               Home Medications    Prior to Admission medications   Medication Sig Start Date End Date Taking? Authorizing Provider  fluconazole  (DIFLUCAN ) 100 MG tablet Take one tablet by mouth daily for 7 days.  Wait one week.  If any symptoms of thrush remain, refill the medication and take daily for one more  week. 11/08/23  Yes Ival Domino, FNP  ALPRAZolam  (XANAX ) 0.25 MG tablet Take 0.25 mg by mouth 2 (two) times daily as needed for anxiety.    [provider]  amLODipine  (NORVASC ) 5 MG tablet Take 5 mg by mouth daily.    [provider]  atenolol  (TENORMIN ) 50 MG tablet Take 50 mg by mouth daily.    [provider]  dexamethasone  (DECADRON ) 4 MG tablet Take 2 tablets (8mg ) by mouth daily starting the day after carboplatin  for 3 days. Take with food 05/23/23   Lewis, Dequincy A, MD  HYDROcodone  bit-homatropine (HYCODAN) 5-1.5 MG/5ML syrup Take 5 mLs by mouth every 6 (six) hours as needed for cough. 05/20/23   Ezzard Valaria LABOR, MD  lidocaine -prilocaine  (EMLA ) cream Apply to affected area once 05/23/23   Lewis, Dequincy A, MD  lisinopril  (PRINIVIL ,ZESTRIL ) 20 MG tablet Take 20 mg by mouth daily. 01/20/17   [provider]  loperamide (IMODIUM A-D) 2 MG tablet Take 2 mg by mouth 4 (four) times daily as needed for diarrhea or  loose stools.    [provider]  loratadine  (CLARITIN ) 10 MG tablet Take 1 tablet (10 mg total) by mouth daily. For bone pain associated with udenyca  injection 05/23/23   Dasie Tinnie MATSU, NP  magnesium  oxide (MAG-OX) 400 (240 Mg) MG tablet Take 1 tablet (400 mg total) by mouth 2 (two) times daily. 06/24/23   Lewis, Dequincy A, MD  OLANZapine  (ZYPREXA ) 5 MG tablet Take 1 tablet (5 mg total) by mouth at bedtime. For anxiety, nausea, and appetite related to cancer & chemotherapy 05/23/23   Dasie Tinnie MATSU, NP  omeprazole (PRILOSEC) 40 MG capsule Take 40 mg by mouth daily.    [provider]  ondansetron  (ZOFRAN ) 8 MG tablet Take 1 tablet (8 mg total) by mouth every 8 (eight) hours as needed for nausea or vomiting. Start on the third day after carboplatin . 05/23/23   Ezzard Valaria LABOR, MD  oxyCODONE  (OXY IR/ROXICODONE ) 5 MG immediate release tablet Take 1 tablet (5 mg total) by mouth every 3 (three) hours as needed for moderate pain ((score 4 to 6)). 09/13/20   Cheryle Debby LABOR, MD  potassium chloride  (MICRO-K ) 10 MEQ CR capsule Take 1 capsule (10 mEq total) by mouth 2 (two) times daily. Patient not taking: Reported on 09/28/2023 05/26/23 09/28/23  Ezzard Valaria LABOR, MD  prochlorperazine  (COMPAZINE ) 10 MG tablet Take 1 tablet (10 mg total) by mouth every 6 (six) hours as needed for nausea or vomiting. 05/23/23   Ezzard, Dequincy A, MD  sennosides-docusate sodium  (SENOKOT-S) 8.6-50 MG tablet Take 1 tablet by mouth in the morning and at bedtime. 06/27/23   [provider]  spironolactone  (ALDACTONE ) 25 MG tablet Take 25 mg by mouth daily.    [provider]  venlafaxine  XR (EFFEXOR -XR) 150 MG 24 hr capsule Take 150 mg by mouth daily. 12/13/22   [provider]    Family History Family History  Problem Relation Age of Onset   Osteoporosis Mother    Cancer Mother        renal cell, brain   CAD Father    CAD Paternal Uncle    Parkinson's disease Maternal Grandfather     Cervical cancer Paternal Grandmother    CAD Paternal Grandfather     Social History Social History   Tobacco Use   Smoking status: Former    Current packs/day: 0.00    Average packs/day: 1 pack/day for 39.0 years (39.0 ttl pk-yrs)  Types: Cigarettes    Start date: 66    Quit date: 2022    Years since quitting: 3.6   Smokeless tobacco: Never  Vaping Use   Vaping status: Never Used  Substance Use Topics   Alcohol use: Yes    Alcohol/week: 2.0 standard drinks of alcohol    Types: 2 Glasses of wine per week    Comment: occas   Drug use: No     Allergies   Paclitaxel  and Cosentyx [secukinumab]   Review of Systems Review of Systems  Constitutional:  Positive for fatigue. Negative for chills and fever.  HENT:  Positive for congestion, postnasal drip and rhinorrhea. Negative for ear pain and sore throat.   Eyes:  Negative for pain and visual disturbance.  Respiratory:  Positive for cough. Negative for shortness of breath.   Cardiovascular:  Negative for chest pain and palpitations.  Gastrointestinal:  Negative for abdominal pain, constipation, diarrhea, nausea and vomiting.  Genitourinary:  Negative for dysuria and hematuria.  Musculoskeletal:  Positive for arthralgias. Negative for back pain.  Skin:  Negative for color change and rash.  Neurological:  Positive for weakness. Negative for seizures and syncope.  All other systems reviewed and are negative.    Physical Exam Triage Vital Signs ED Triage Vitals [11/08/23 1306]  Encounter Vitals Group     BP 103/71     Girls Systolic BP Percentile      Girls Diastolic BP Percentile      Boys Systolic BP Percentile      Boys Diastolic BP Percentile      Pulse Rate 87     Resp 20     Temp 98.4 F (36.9 C)     Temp Source Oral     SpO2 96 %     Weight      Height      Head Circumference      Peak Flow      Pain Score 10     Pain Loc      Pain Education      Exclude from Growth Chart    No data  found.  Updated Vital Signs BP 103/71 (BP Location: Right Arm)   Pulse 87   Temp 98.4 F (36.9 C) (Oral)   Resp 20   LMP 11/15/2011   SpO2 96%   Visual Acuity Right Eye Distance:   Left Eye Distance:   Bilateral Distance:    Right Eye Near:   Left Eye Near:    Bilateral Near:     Physical Exam Vitals and nursing note reviewed.  Constitutional:      General: She is not in acute distress.    Appearance: She is well-developed. She is obese. She is ill-appearing and diaphoretic. She is not toxic-appearing.  HENT:     Head: Normocephalic and atraumatic.     Right Ear: Hearing, tympanic membrane, ear canal and external ear normal.     Left Ear: Hearing, tympanic membrane, ear canal and external ear normal.     Nose: Congestion and rhinorrhea present.     Right Sinus: No maxillary sinus tenderness or frontal sinus tenderness.     Left Sinus: No maxillary sinus tenderness or frontal sinus tenderness.     Mouth/Throat:     Lips: Pink.     Mouth: Mucous membranes are moist.     Pharynx: Uvula midline. Posterior oropharyngeal erythema present. No oropharyngeal exudate.     Tonsils: No tonsillar exudate.  Eyes:  Conjunctiva/sclera: Conjunctivae normal.     Pupils: Pupils are equal, round, and reactive to light.  Cardiovascular:     Rate and Rhythm: Normal rate and regular rhythm.     Heart sounds: S1 normal and S2 normal. No murmur heard. Pulmonary:     Effort: Pulmonary effort is normal. No respiratory distress.     Breath sounds: Normal breath sounds. No decreased breath sounds, wheezing, rhonchi or rales.  Abdominal:     General: Bowel sounds are normal.     Palpations: Abdomen is soft.     Tenderness: There is no abdominal tenderness.  Musculoskeletal:        General: No swelling.     Cervical back: Neck supple.  Lymphadenopathy:     Head:     Right side of head: No submental, submandibular, tonsillar, preauricular or posterior auricular adenopathy.     Left side of  head: No submental, submandibular, tonsillar, preauricular or posterior auricular adenopathy.     Cervical: Cervical adenopathy present.     Right cervical: Superficial cervical adenopathy present.     Left cervical: Superficial cervical adenopathy present.  Skin:    General: Skin is warm.     Capillary Refill: Capillary refill takes less than 2 seconds.     Findings: No rash.  Neurological:     Mental Status: She is alert and oriented to person, place, and time.  Psychiatric:        Mood and Affect: Mood normal.      UC Treatments / Results  Labs (all labs ordered are listed, but only abnormal results are displayed) Labs Reviewed  POC COVID19/FLU A&B COMBO - Normal  POCT RAPID STREP A (OFFICE) - Normal    EKG   Radiology No results found.  Procedures Procedures (including critical care time)  Medications Ordered in UC Medications - No data to display  Initial Impression / Assessment and Plan / UC Course  I have reviewed the triage vital signs and the nursing notes.  Pertinent labs & imaging results that were available during my care of the patient were reviewed by me and considered in my medical decision making (see chart for details).  Plan of Care: Oral thrush with cough, body aches, sore throat and malaise: Rapid flu, COVID, strep are all negative.  She does have oral thrush.  Fluconazole  100 mg daily for 7 days.  Also provided Magic mouthwash for for use 4 times daily if needed.  Get plenty of fluids and rest.  If symptoms persist, may need to follow-up with the cancer center and might even need IV fluids or other medications they might have to help with tolerance of the chemotherapy.  I believe most of her symptoms are probably secondary to the chemotherapy and not an acute illness.  Follow-up here as needed.  I reviewed the plan of care with the patient and/or the patient's guardian.  The patient and/or guardian had time to ask questions and acknowledged that  the questions were answered.  I provided instruction on symptoms or reasons to return here or to go to an ER, if symptoms/condition did not improve, worsened or if new symptoms occurred.  Final Clinical Impressions(s) / UC Diagnoses   Final diagnoses:  Acute cough  Body aches  Sore throat  Malaise  Oral thrush     Discharge Instructions      Oral thrush with cough, body aches sore throat and malaise: COVID, flu and strep test were all negative.  Will treat for  thrush with fluconazole  100 mg daily for 7 days.  Also provided Magic mouthwash, swish and swallow, 4 times daily if needed.  No cause of her malaise was found on testing or exam.  I believe this may all be adjustments after her most recent chemotherapy.  Encouraged good fluid intake and rest.  Follow-up with oncology.  Return here as needed.     ED Prescriptions     Medication Sig Dispense Auth. Provider   magic mouthwash (nystatin , lidocaine , diphenhydrAMINE ) suspension Swish and swallow or spit out up to 4 times daily if needed for oral thrush. 300 mL Ival Domino, FNP   fluconazole  (DIFLUCAN ) 100 MG tablet Take one tablet by mouth daily for 7 days.  Wait one week.  If any symptoms of thrush remain, refill the medication and take daily for one more week. 7 tablet Nelly Scriven, FNP      PDMP not reviewed this encounter.   Ival Domino, FNP 11/08/23 1413

## 2023-11-08 NOTE — Telephone Encounter (Signed)
 Opened in error

## 2023-11-09 ENCOUNTER — Encounter: Payer: Self-pay | Admitting: Oncology

## 2023-11-09 ENCOUNTER — Telehealth: Payer: Self-pay | Admitting: Hematology and Oncology

## 2023-11-09 ENCOUNTER — Inpatient Hospital Stay

## 2023-11-09 ENCOUNTER — Other Ambulatory Visit (HOSPITAL_BASED_OUTPATIENT_CLINIC_OR_DEPARTMENT_OTHER): Payer: Self-pay

## 2023-11-09 ENCOUNTER — Other Ambulatory Visit: Payer: Self-pay | Admitting: Hematology and Oncology

## 2023-11-09 ENCOUNTER — Ambulatory Visit (HOSPITAL_BASED_OUTPATIENT_CLINIC_OR_DEPARTMENT_OTHER)
Admission: RE | Admit: 2023-11-09 | Discharge: 2023-11-09 | Disposition: A | Discharge status: Home / Self Care | Source: Ambulatory Visit | Attending: Hematology and Oncology | Admitting: Hematology and Oncology

## 2023-11-09 ENCOUNTER — Inpatient Hospital Stay (HOSPITAL_BASED_OUTPATIENT_CLINIC_OR_DEPARTMENT_OTHER): Admitting: Hematology and Oncology

## 2023-11-09 ENCOUNTER — Other Ambulatory Visit: Payer: Self-pay

## 2023-11-09 VITALS — BP 107/83 | HR 80 | Temp 98.2°F | Resp 20

## 2023-11-09 DIAGNOSIS — Z5111 Encounter for antineoplastic chemotherapy: Secondary | ICD-10-CM | POA: Diagnosis not present

## 2023-11-09 DIAGNOSIS — C3401 Malignant neoplasm of right main bronchus: Secondary | ICD-10-CM

## 2023-11-09 DIAGNOSIS — E876 Hypokalemia: Secondary | ICD-10-CM | POA: Diagnosis not present

## 2023-11-09 DIAGNOSIS — R110 Nausea: Secondary | ICD-10-CM

## 2023-11-09 LAB — CMP (CANCER CENTER ONLY)
ALT: 17 U/L (ref 0–44)
AST: 23 U/L (ref 15–41)
Albumin: 4.2 g/dL (ref 3.5–5.0)
Alkaline Phosphatase: 117 U/L (ref 38–126)
Anion gap: 15 (ref 5–15)
BUN: 11 mg/dL (ref 8–23)
CO2: 24 mmol/L (ref 22–32)
Calcium: 9.3 mg/dL (ref 8.9–10.3)
Chloride: 98 mmol/L (ref 98–111)
Creatinine: 0.64 mg/dL (ref 0.44–1.00)
GFR, Estimated: >60 mL/min (ref >60)
Glucose, Bld: 156 mg/dL — ABNORMAL HIGH (ref 70–99)
Potassium: 3 mmol/L — ABNORMAL LOW (ref 3.5–5.1)
Sodium: 136 mmol/L (ref 135–145)
Total Bilirubin: 0.5 mg/dL (ref 0.0–1.2)
Total Protein: 7.1 g/dL (ref 6.5–8.1)

## 2023-11-09 LAB — CBC WITH DIFFERENTIAL (CANCER CENTER ONLY)
Abs Immature Granulocytes: 0.08 K/uL — ABNORMAL HIGH (ref 0.00–0.07)
Basophils Absolute: 0 K/uL (ref 0.0–0.1)
Basophils Relative: 1 %
Eosinophils Absolute: 0.1 K/uL (ref 0.0–0.5)
Eosinophils Relative: 1 %
HCT: 31.6 % — ABNORMAL LOW (ref 36.0–46.0)
Hemoglobin: 10.4 g/dL — ABNORMAL LOW (ref 12.0–15.0)
Immature Granulocytes: 1 %
Lymphocytes Relative: 15 %
Lymphs Abs: 0.9 K/uL (ref 0.7–4.0)
MCH: 30.2 pg (ref 26.0–34.0)
MCHC: 32.9 g/dL (ref 30.0–36.0)
MCV: 91.9 fL (ref 80.0–100.0)
Monocytes Absolute: 0.2 K/uL (ref 0.1–1.0)
Monocytes Relative: 3 %
Neutro Abs: 4.5 K/uL (ref 1.7–7.7)
Neutrophils Relative %: 79 %
Platelet Count: 212 K/uL (ref 150–400)
RBC: 3.44 MIL/uL — ABNORMAL LOW (ref 3.87–5.11)
RDW: 14.8 % (ref 11.5–15.5)
WBC Count: 5.7 K/uL (ref 4.0–10.5)
nRBC: 0 % (ref 0.0–0.2)

## 2023-11-09 LAB — MAGNESIUM: Magnesium: 1.4 mg/dL — ABNORMAL LOW (ref 1.7–2.4)

## 2023-11-09 MED ORDER — SODIUM CHLORIDE 0.9 % IV SOLN: INTRAVENOUS | Status: DC | PRN

## 2023-11-09 MED ORDER — ZOLPIDEM TARTRATE 5 MG PO TABS
5.0000 mg | ORAL_TABLET | Freq: Every evening | ORAL | 0 refills | Status: AC | PRN
  Filled 2023-11-09: qty 30, 30d supply, fill #0

## 2023-11-09 MED ORDER — POTASSIUM CHLORIDE 10 MEQ/100ML IV SOLN
10.0000 meq | INTRAVENOUS | Status: AC
  Administered 2023-11-09 (×4): 10 meq via INTRAVENOUS
  Filled 2023-11-09 (×2): qty 100

## 2023-11-09 MED ORDER — SODIUM CHLORIDE 0.9 % IV SOLN: INTRAVENOUS | Status: DC

## 2023-11-09 MED ORDER — MAGNESIUM SULFATE 2 GM/50ML IV SOLN
2.0000 g | Freq: Once | INTRAVENOUS | Status: AC
  Administered 2023-11-09 (×2): 2 g via INTRAVENOUS
  Filled 2023-11-09: qty 50

## 2023-11-09 NOTE — Progress Notes (Signed)
 [facility]  [address] [place],  [place]  [address] [phone]  Clinic Day: 11/01/2023  Referring physician: Sabas Norleen PARAS., MD   HISTORY OF PRESENT ILLNESS:  The patient is a 63 y.o. female with stage IVB (T2a N2a M1c1) squamous cell lung cancer, which includes spread of disease to her liver.  She comes in today for symptom management. She last had Abraxane  08/07 and is now experiencing severe sore throat with shortness of breath. She was evaluated in Urgent Care yesterday where she was swabbed for flu, strep and COVID; all were negative.  PHYSICAL EXAM:  Last menstrual period 11/15/2011. Wt Readings from Last 3 Encounters:  11/03/23 267 lb (121.1 kg)  11/01/23 267 lb 8 oz (121.3 kg)  09/26/23 267 lb 6.4 oz (121.3 kg)   There is no height or weight on file to calculate BMI. Performance status (ECOG): 2 - Symptomatic, <50% confined to bed Physical Exam Constitutional:      Appearance: Normal appearance. She is not ill-appearing.     Comments: A pleasant woman in a wheelchair.  She looks stronger vs previous visits.  HENT:     Mouth/Throat:     Mouth: Mucous membranes are moist.     Pharynx: Oropharynx is clear. No oropharyngeal exudate or posterior oropharyngeal erythema.  Cardiovascular:     Rate and Rhythm: Normal rate and regular rhythm.     Heart sounds: No murmur heard.    No friction rub. No gallop.  Pulmonary:     Effort: Pulmonary effort is normal. No respiratory distress.     Breath sounds: Examination of the right-lower field reveals wheezing. Wheezing present. No rhonchi or rales.  Abdominal:     General: Bowel sounds are normal. There is no distension.     Palpations: Abdomen is soft. There is no mass.     Tenderness: There is no abdominal tenderness.  Musculoskeletal:        General: No swelling.     Right lower leg: No edema.     Left lower leg: No edema.  Lymphadenopathy:     Cervical: No cervical adenopathy.      Upper Body:     Right upper body: No supraclavicular or axillary adenopathy.     Left upper body: No supraclavicular or axillary adenopathy.     Lower Body: No right inguinal adenopathy. No left inguinal adenopathy.  Skin:    General: Skin is warm.     Coloration: Skin is not jaundiced.     Findings: No lesion or rash.  Neurological:     General: No focal deficit present.     Mental Status: She is alert and oriented to person, place, and time. Mental status is at baseline.  Psychiatric:        Mood and Affect: Mood normal.        Behavior: Behavior normal.        Thought Content: Thought content normal.   SCANS:  CT scans of her chest/abdomen/pelvis done on 10/19/2023 revealed the following: FINDINGS: CT CHEST FINDINGS   Cardiovascular: The heart size is normal. No substantial pericardial effusion. Coronary artery calcification is evident. Mild atherosclerotic calcification is noted in the wall of the thoracic aorta. Right Port-A-Cath tip is positioned in the right atrium.   Mediastinum/Nodes: No mediastinal lymphadenopathy. There is no hilar lymphadenopathy. The esophagus has normal imaging features. There is no axillary lymphadenopathy.   Lungs/Pleura: Persistent consolidative opacity identified in the superior right lower lobe with  associated volume loss. Attenuation of airways to the right lower lobe is similar to prior. Bandlike areas of irregular nodular consolidative disease in the medial anterior right lung are similar. Stable 9 mm left lower lobe nodule on 93/302. No pulmonary edema or substantial pleural effusion.   Musculoskeletal: No worrisome lytic or sclerotic osseous abnormality. Nonacute fracture anterior right sixth rib is new in the interval since prior study.   CT ABDOMEN PELVIS FINDINGS   Hepatobiliary: No suspicious focal abnormality within the liver parenchyma. Previous PET-CT of 04/01/2023 identified four hypermetabolic liver lesions although no  discrete suspicious liver lesion evident by CT imaging today. There is no evidence for gallstones, gallbladder wall thickening, or pericholecystic fluid. No intrahepatic or extrahepatic biliary dilation.   Pancreas: No focal mass lesion. No dilatation of the main duct. No intraparenchymal cyst. No peripancreatic edema.   Spleen: SIRT normal spleen   Adrenals/Urinary Tract: No adrenal nodule or mass. Tiny well-defined homogeneous low-density lesions in the right kidney are too small to characterize but statistically most likely benign and probably cysts. No followup imaging is recommended. Area of concern in the upper pole right kidney on the 07/28/2023 exam has decreased substantially in the interval measuring 11 x 10 mm today compared to 17 x 15 mm previously. No evidence for hydroureter. The urinary bladder appears normal for the degree of distention.   Stomach/Bowel: Stomach is decompressed. Duodenum is normally positioned as is the ligament of Treitz. No small bowel wall thickening. No small bowel dilatation. The terminal ileum is normal. The appendix is not well visualized, but there is no edema or inflammation in the region of the cecal tip to suggest appendicitis. No gross colonic mass. No colonic wall thickening.   Vascular/Lymphatic: There is mild atherosclerotic calcification of the abdominal aorta without aneurysm. There is no gastrohepatic or hepatoduodenal ligament lymphadenopathy. No retroperitoneal or mesenteric lymphadenopathy. No pelvic sidewall lymphadenopathy.   Reproductive: Hysterectomy.  There is no adnexal mass.   Other: No intraperitoneal free fluid.   Musculoskeletal: No worrisome lytic or sclerotic osseous abnormality. Status post lumbar fusion from L2-L5 with degenerative changes at the L1-2 and L5-S1 disc spaces.   IMPRESSION: 1. Stable exam. No new or progressive findings in the chest. 2. Persistent consolidative opacity in the superior right  lower lobe with associated volume loss. Attenuation of airways to the right lower lobe is similar to prior. Bandlike areas of irregular nodular consolidative disease in the medial anterior right lung are similar to prior. 3. Stable 9 mm left lower lobe nodule. Attention on follow-up recommended. 4. Previous PET-CT of 04/01/2023 identified four hypermetabolic liver lesions although no discrete suspicious liver lesion evident by CT imaging today. 5. Area of concern in the upper pole right kidney on the 07/28/2023 exam has decreased substantially in the interval measuring 11 x 10 mm today compared to 17 x 15 mm previously. 6. Nonacute fracture anterior right sixth rib is new in the interval since prior study. 7.  Aortic Atherosclerosis (ICD10-I70.0).   LABS:      Latest Ref Rng & Units 11/09/2023   11:09 AM 11/01/2023   11:30 AM 09/26/2023    9:39 AM  CBC  WBC 4.0 - 10.5 K/uL 5.7  13.2  4.6   Hemoglobin 12.0 - 15.0 g/dL 89.5  89.6  89.4   Hematocrit 36.0 - 46.0 % 31.6  31.4  32.8   Platelets 150 - 400 K/uL 212  266  201       Latest Ref Rng &  Units 11/09/2023   11:09 AM 11/01/2023   11:30 AM 09/26/2023    9:39 AM  CMP  Glucose 70 - 99 mg/dL 843  863  847   BUN 8 - 23 mg/dL 11  6  6    Creatinine 0.44 - 1.00 mg/dL 9.35  9.48  9.34   Sodium 135 - 145 mmol/L 136  137  136   Potassium 3.5 - 5.1 mmol/L 3.0  3.6  3.6   Chloride 98 - 111 mmol/L 98  96  97   CO2 22 - 32 mmol/L 24  27  25    Calcium 8.9 - 10.3 mg/dL 9.3  9.3  9.9   Total Protein 6.5 - 8.1 g/dL 7.1  7.6  7.4   Total Bilirubin 0.0 - 1.2 mg/dL 0.5  0.5  0.2   Alkaline Phos 38 - 126 U/L 117  161  157   AST 15 - 41 U/L 23  18  19    ALT 0 - 44 U/L 17  7  10      ASSESSMENT & PLAN:  Assessment/Plan:  A 63 y.o. female with stage IVB (T2a N2a M1c1) squamous cell lung cancer, which includes spread of disease to her liver. She started feeling poorly yesterday with mainly sore throat. She was swabbed in the Urgent Care and was found  to be negative for flu, strep and COVID. She was noted to have thrush which most likely extends into her throat. She was given diflucan . Today she continues with sore throat to the point of not being able to eat or drink. She is noted to be dehydrated and we will hydrate with 1 liter of normal saline as well as potassium and magnesium  replacement. I sent for CXR which was negative for pneumonia. She notes a cough and wheeze which is heard on exam. She states she has breathing treatments at home, but has not used one for a while. After IV hydration, she felt better and will do a breathing treatment when she gets home. We also arranged for evaluation with pulmonology. She knows to contact us  for further concern and to keep scheduled appointments.   Eleanor DELENA Bach, NP

## 2023-11-09 NOTE — Patient Instructions (Signed)

## 2023-11-09 NOTE — Patient Instructions (Signed)

## 2023-11-09 NOTE — Telephone Encounter (Signed)
 Contacted  Pulmonary to set up URGENT Pulm Consult appt, per scheduler the soonest she could get in was on 12/27/23. Appt was scheduled and patient was placed on a waitlist to potentially  be seen sooner.  I did give pt appt information and address, tried to offer her to see Dr. Brenna as he is able to see her ending August 2025 to early Sept 2025 in [place] but pt requests to just keep appt in [place] with .

## 2023-11-10 ENCOUNTER — Other Ambulatory Visit: Payer: Self-pay

## 2023-11-23 NOTE — Progress Notes (Unsigned)
 [facility]  [address] [place],  [place]  [address] [phone]  Clinic Day: 11/01/2023  Referring physician: Sabas Norleen PARAS., MD   HISTORY OF PRESENT ILLNESS:  The patient is a 63 y.o. female with stage IVB (T2a N2a M1c1) squamous cell lung cancer, which includes spread of disease to her liver.  She comes in today to be evaluated before heading into her 2nd cycle of maintenance Abraxane .  This was preceded by 6 cycles of palliative carboplatin -based chemotherapy.  Of note, the patient had an allergic reaction to paclitaxel  after her fourth cycle of treatment.  Based upon this, she was switched to carboplatin /Abraxane  for her 5th and 6th cycles.   PHYSICAL EXAM:  Last menstrual period 11/15/2011. Wt Readings from Last 3 Encounters:  11/03/23 267 lb (121.1 kg)  11/01/23 267 lb 8 oz (121.3 kg)  09/26/23 267 lb 6.4 oz (121.3 kg)   There is no height or weight on file to calculate BMI. Performance status (ECOG): 2 - Symptomatic, <50% confined to bed Physical Exam Constitutional:      Appearance: Normal appearance. She is not ill-appearing.     Comments: A pleasant woman in a wheelchair.  She looks stronger vs previous visits.  HENT:     Mouth/Throat:     Mouth: Mucous membranes are moist.     Pharynx: Oropharynx is clear. No oropharyngeal exudate or posterior oropharyngeal erythema.  Cardiovascular:     Rate and Rhythm: Normal rate and regular rhythm.     Heart sounds: No murmur heard.    No friction rub. No gallop.  Pulmonary:     Effort: Pulmonary effort is normal. No respiratory distress.     Breath sounds: Examination of the right-lower field reveals wheezing. Wheezing present. No rhonchi or rales.  Abdominal:     General: Bowel sounds are normal. There is no distension.     Palpations: Abdomen is soft. There is no mass.     Tenderness: There is no abdominal tenderness.  Musculoskeletal:        General: No swelling.     Right lower  leg: No edema.     Left lower leg: No edema.  Lymphadenopathy:     Cervical: No cervical adenopathy.     Upper Body:     Right upper body: No supraclavicular or axillary adenopathy.     Left upper body: No supraclavicular or axillary adenopathy.     Lower Body: No right inguinal adenopathy. No left inguinal adenopathy.  Skin:    General: Skin is warm.     Coloration: Skin is not jaundiced.     Findings: No lesion or rash.  Neurological:     General: No focal deficit present.     Mental Status: She is alert and oriented to person, place, and time. Mental status is at baseline.  Psychiatric:        Mood and Affect: Mood normal.        Behavior: Behavior normal.        Thought Content: Thought content normal.   SCANS:  CT scans of her chest/abdomen/pelvis done on 10/19/2023 revealed the following: FINDINGS: CT CHEST FINDINGS   Cardiovascular: The heart size is normal. No substantial pericardial effusion. Coronary artery calcification is evident. Mild atherosclerotic calcification is noted in the wall of the thoracic aorta. Right Port-A-Cath tip is positioned in the right atrium.   Mediastinum/Nodes: No mediastinal lymphadenopathy. There is no hilar lymphadenopathy. The esophagus has normal imaging features. There  is no axillary lymphadenopathy.   Lungs/Pleura: Persistent consolidative opacity identified in the superior right lower lobe with associated volume loss. Attenuation of airways to the right lower lobe is similar to prior. Bandlike areas of irregular nodular consolidative disease in the medial anterior right lung are similar. Stable 9 mm left lower lobe nodule on 93/302. No pulmonary edema or substantial pleural effusion.   Musculoskeletal: No worrisome lytic or sclerotic osseous abnormality. Nonacute fracture anterior right sixth rib is new in the interval since prior study.   CT ABDOMEN PELVIS FINDINGS   Hepatobiliary: No suspicious focal abnormality within the  liver parenchyma. Previous PET-CT of 04/01/2023 identified four hypermetabolic liver lesions although no discrete suspicious liver lesion evident by CT imaging today. There is no evidence for gallstones, gallbladder wall thickening, or pericholecystic fluid. No intrahepatic or extrahepatic biliary dilation.   Pancreas: No focal mass lesion. No dilatation of the main duct. No intraparenchymal cyst. No peripancreatic edema.   Spleen: SIRT normal spleen   Adrenals/Urinary Tract: No adrenal nodule or mass. Tiny well-defined homogeneous low-density lesions in the right kidney are too small to characterize but statistically most likely benign and probably cysts. No followup imaging is recommended. Area of concern in the upper pole right kidney on the 07/28/2023 exam has decreased substantially in the interval measuring 11 x 10 mm today compared to 17 x 15 mm previously. No evidence for hydroureter. The urinary bladder appears normal for the degree of distention.   Stomach/Bowel: Stomach is decompressed. Duodenum is normally positioned as is the ligament of Treitz. No small bowel wall thickening. No small bowel dilatation. The terminal ileum is normal. The appendix is not well visualized, but there is no edema or inflammation in the region of the cecal tip to suggest appendicitis. No gross colonic mass. No colonic wall thickening.   Vascular/Lymphatic: There is mild atherosclerotic calcification of the abdominal aorta without aneurysm. There is no gastrohepatic or hepatoduodenal ligament lymphadenopathy. No retroperitoneal or mesenteric lymphadenopathy. No pelvic sidewall lymphadenopathy.   Reproductive: Hysterectomy.  There is no adnexal mass.   Other: No intraperitoneal free fluid.   Musculoskeletal: No worrisome lytic or sclerotic osseous abnormality. Status post lumbar fusion from L2-L5 with degenerative changes at the L1-2 and L5-S1 disc spaces.   IMPRESSION: 1. Stable exam.  No new or progressive findings in the chest. 2. Persistent consolidative opacity in the superior right lower lobe with associated volume loss. Attenuation of airways to the right lower lobe is similar to prior. Bandlike areas of irregular nodular consolidative disease in the medial anterior right lung are similar to prior. 3. Stable 9 mm left lower lobe nodule. Attention on follow-up recommended. 4. Previous PET-CT of 04/01/2023 identified four hypermetabolic liver lesions although no discrete suspicious liver lesion evident by CT imaging today. 5. Area of concern in the upper pole right kidney on the 07/28/2023 exam has decreased substantially in the interval measuring 11 x 10 mm today compared to 17 x 15 mm previously. 6. Nonacute fracture anterior right sixth rib is new in the interval since prior study. 7.  Aortic Atherosclerosis (ICD10-I70.0).   LABS:      Latest Ref Rng & Units 11/09/2023   11:09 AM 11/01/2023   11:30 AM 09/26/2023    9:39 AM  CBC  WBC 4.0 - 10.5 K/uL 5.7  13.2  4.6   Hemoglobin 12.0 - 15.0 g/dL 89.5  89.6  89.4   Hematocrit 36.0 - 46.0 % 31.6  31.4  32.8   Platelets 150 -  400 K/uL 212  266  201       Latest Ref Rng & Units 11/09/2023   11:09 AM 11/01/2023   11:30 AM 09/26/2023    9:39 AM  CMP  Glucose 70 - 99 mg/dL 843  863  847   BUN 8 - 23 mg/dL 11  6  6    Creatinine 0.44 - 1.00 mg/dL 9.35  9.48  9.34   Sodium 135 - 145 mmol/L 136  137  136   Potassium 3.5 - 5.1 mmol/L 3.0  3.6  3.6   Chloride 98 - 111 mmol/L 98  96  97   CO2 22 - 32 mmol/L 24  27  25    Calcium 8.9 - 10.3 mg/dL 9.3  9.3  9.9   Total Protein 6.5 - 8.1 g/dL 7.1  7.6  7.4   Total Bilirubin 0.0 - 1.2 mg/dL 0.5  0.5  0.2   Alkaline Phos 38 - 126 U/L 117  161  157   AST 15 - 41 U/L 23  18  19    ALT 0 - 44 U/L 17  7  10      ASSESSMENT & PLAN:  Assessment/Plan:  A 63 y.o. female with stage IVB (T2a N2a M1c1) squamous cell lung cancer, which includes spread of disease to her liver.  In  clinic today, I went over all of her CT scan images with her, for which I continue to see no evidence of disease progression.  She still has an area of consolidation in the lower aspect of her right upper lobe.  I still would like for her to be seen by pulmonology to see if this area could potentially be reopened and prevent a future postobstructive pneumonia.  I am particularly pleased that her CT scans continue to show no visible evidence of metastatic liver disease.  Most of this visit today was focused around what to do next after her 6 cycles of platinum/taxane based chemotherapy.  Ultimately, we came to the decision to continue her on maintenance Abraxane , which she will receive once every 3 weeks.  Her first cycle of maintenance Abraxane  will commence on Thursday, August 7th.  I will see her back in 3 weeks before she heads into her second cycle of maintenance Abraxane .  The patient understands all the plans discussed today and is in agreement with them.    Sahian Kerney DELENA Kerns, MD

## 2023-11-24 ENCOUNTER — Other Ambulatory Visit: Payer: Self-pay | Admitting: Pharmacist

## 2023-11-24 ENCOUNTER — Encounter: Payer: Self-pay | Admitting: Oncology

## 2023-11-24 ENCOUNTER — Inpatient Hospital Stay

## 2023-11-24 ENCOUNTER — Other Ambulatory Visit: Payer: Self-pay

## 2023-11-24 ENCOUNTER — Inpatient Hospital Stay (HOSPITAL_BASED_OUTPATIENT_CLINIC_OR_DEPARTMENT_OTHER): Admitting: Oncology

## 2023-11-24 VITALS — BP 134/85 | HR 90 | Temp 98.0°F | Resp 18 | Ht 72.0 in | Wt 272.4 lb

## 2023-11-24 DIAGNOSIS — C3401 Malignant neoplasm of right main bronchus: Secondary | ICD-10-CM

## 2023-11-24 DIAGNOSIS — Z5111 Encounter for antineoplastic chemotherapy: Secondary | ICD-10-CM | POA: Diagnosis not present

## 2023-11-24 LAB — CBC WITH DIFFERENTIAL (CANCER CENTER ONLY)
Abs Immature Granulocytes: 0.09 K/uL — ABNORMAL HIGH (ref 0.00–0.07)
Basophils Absolute: 0 K/uL (ref 0.0–0.1)
Basophils Relative: 1 %
Eosinophils Absolute: 0.1 K/uL (ref 0.0–0.5)
Eosinophils Relative: 2 %
HCT: 30.3 % — ABNORMAL LOW (ref 36.0–46.0)
Hemoglobin: 9.8 g/dL — ABNORMAL LOW (ref 12.0–15.0)
Immature Granulocytes: 2 %
Lymphocytes Relative: 16 %
Lymphs Abs: 0.9 K/uL (ref 0.7–4.0)
MCH: 30.1 pg (ref 26.0–34.0)
MCHC: 32.3 g/dL (ref 30.0–36.0)
MCV: 92.9 fL (ref 80.0–100.0)
Monocytes Absolute: 0.7 K/uL (ref 0.1–1.0)
Monocytes Relative: 11 %
Neutro Abs: 4 K/uL (ref 1.7–7.7)
Neutrophils Relative %: 68 %
Platelet Count: 232 K/uL (ref 150–400)
RBC: 3.26 MIL/uL — ABNORMAL LOW (ref 3.87–5.11)
RDW: 15.3 % (ref 11.5–15.5)
WBC Count: 5.8 K/uL (ref 4.0–10.5)
nRBC: 0 % (ref 0.0–0.2)

## 2023-11-24 LAB — CMP (CANCER CENTER ONLY)
ALT: 11 U/L (ref 0–44)
AST: 17 U/L (ref 15–41)
Albumin: 4 g/dL (ref 3.5–5.0)
Alkaline Phosphatase: 132 U/L — ABNORMAL HIGH (ref 38–126)
Anion gap: 13 (ref 5–15)
BUN: 5 mg/dL — ABNORMAL LOW (ref 8–23)
CO2: 26 mmol/L (ref 22–32)
Calcium: 9.8 mg/dL (ref 8.9–10.3)
Chloride: 98 mmol/L (ref 98–111)
Creatinine: 0.57 mg/dL (ref 0.44–1.00)
GFR, Estimated: >60 mL/min (ref >60)
Glucose, Bld: 140 mg/dL — ABNORMAL HIGH (ref 70–99)
Potassium: 3.4 mmol/L — ABNORMAL LOW (ref 3.5–5.1)
Sodium: 137 mmol/L (ref 135–145)
Total Bilirubin: 0.3 mg/dL (ref 0.0–1.2)
Total Protein: 7.1 g/dL (ref 6.5–8.1)

## 2023-11-24 LAB — MAGNESIUM: Magnesium: 1.4 mg/dL — ABNORMAL LOW (ref 1.7–2.4)

## 2023-11-24 NOTE — Addendum Note (Signed)
 Addended by: AFTON DEVERE HERO on: 11/24/2023 10:06 AM   Modules accepted: Orders

## 2023-11-24 NOTE — Progress Notes (Signed)
 Delay chemo x 1 week due to SOB today.  Also discontinue dexamethasone  premedication from the treatment plan due to difficulty sleeping per Dr. Ezzard.

## 2023-11-30 NOTE — Progress Notes (Signed)
 [facility] [address] [place],  [place]  [address] [phone]  Clinic Day: 12/01/2023  Referring physician: Sabas Norleen PARAS., MD   HISTORY OF PRESENT ILLNESS:  The patient is a 63 y.o. female with stage IVB (T2a N2a M1c1) squamous cell lung cancer, which includes spread of disease to her liver.  She comes in today to be re-evaluated before heading into her 2nd cycle of maintenance Abraxane .  This was preceded by 6 cycles of palliative carboplatin -based chemotherapy.  Of note, the patient had an allergic reaction to paclitaxel  after her fourth cycle of treatment.  Based upon this, she was switched to carboplatin /Abraxane  for her 5th and 6th cycles.  At her visit last week, she was extremely dyspneic with minimal exertion, which led to her being evaluated in the emergency room.  Her workup was unremarkable. The patient states she has had dyspnea with minimal exertion for years, even before her lung cancer diagnosis was made.  She is scheduled to see pulmonology later this month.    PHYSICAL EXAM:  Blood pressure 138/81, pulse 81, temperature 98.4 F (36.9 C), temperature source Oral, resp. rate 16, height 6' (1.829 m), weight 276 lb 8 oz (125.4 kg), last menstrual period 11/15/2011, SpO2 96%. Wt Readings from Last 3 Encounters:  12/01/23 276 lb 8 oz (125.4 kg)  11/24/23 272 lb 6.4 oz (123.6 kg)  11/03/23 267 lb (121.1 kg)   Body mass index is 37.5 kg/m. Performance status (ECOG): 2 - Symptomatic, <50% confined to bed Physical Exam Constitutional:      Appearance: Normal appearance. She is not ill-appearing.     Comments: A pleasant woman in a wheelchair.  However, she gets extremely dyspneic with minor ambulation.    HENT:     Mouth/Throat:     Mouth: Mucous membranes are moist.     Pharynx: Oropharynx is clear. No oropharyngeal exudate or posterior oropharyngeal erythema.  Cardiovascular:     Rate and Rhythm: Normal rate and regular rhythm.      Heart sounds: No murmur heard.    No friction rub. No gallop.  Pulmonary:     Effort: Pulmonary effort is normal. No respiratory distress.     Breath sounds: No wheezing, rhonchi or rales.  Abdominal:     General: Bowel sounds are normal. There is no distension.     Palpations: Abdomen is soft. There is no mass.     Tenderness: There is no abdominal tenderness.  Musculoskeletal:        General: No swelling.     Right lower leg: No edema.     Left lower leg: No edema.  Lymphadenopathy:     Cervical: No cervical adenopathy.     Upper Body:     Right upper body: No supraclavicular or axillary adenopathy.     Left upper body: No supraclavicular or axillary adenopathy.     Lower Body: No right inguinal adenopathy. No left inguinal adenopathy.  Skin:    General: Skin is warm.     Coloration: Skin is not jaundiced.     Findings: No lesion or rash.  Neurological:     General: No focal deficit present.     Mental Status: She is alert and oriented to person, place, and time. Mental status is at baseline.  Psychiatric:        Mood and Affect: Mood normal.        Behavior: Behavior normal.        Thought  Content: Thought content normal.    LABS:      Latest Ref Rng & Units 12/01/2023   10:52 AM 11/24/2023    9:07 AM 11/09/2023   11:09 AM  CBC  WBC 4.0 - 10.5 K/uL 6.1  5.8  5.7   Hemoglobin 12.0 - 15.0 g/dL 9.5  9.8  89.5   Hematocrit 36.0 - 46.0 % 29.6  30.3  31.6   Platelets 150 - 400 K/uL 249  232  212       Latest Ref Rng & Units 12/01/2023   10:52 AM 11/24/2023    9:07 AM 11/09/2023   11:09 AM  CMP  Glucose 70 - 99 mg/dL 883  859  843   BUN 8 - 23 mg/dL 6  5  11    Creatinine 0.44 - 1.00 mg/dL 9.35  9.42  9.35   Sodium 135 - 145 mmol/L 136  137  136   Potassium 3.5 - 5.1 mmol/L 4.3  3.4  3.0   Chloride 98 - 111 mmol/L 99  98  98   CO2 22 - 32 mmol/L 24  26  24    Calcium 8.9 - 10.3 mg/dL 9.6  9.8  9.3   Total Protein 6.5 - 8.1 g/dL 7.1  7.1  7.1   Total Bilirubin 0.0 - 1.2  mg/dL 0.4  0.3  0.5   Alkaline Phos 38 - 126 U/L 135  132  117   AST 15 - 41 U/L 18  17  23    ALT 0 - 44 U/L 5  11  17      ASSESSMENT & PLAN:  Assessment/Plan:  A 63 y.o. female with stage IVB (T2a N2a M1c1) squamous cell lung cancer, which includes spread of disease to her liver. She will proceed with her 2nd cycle of maintenance Abraxane  today, which she will receive every 3 weeks.  Clinically, she appears to be doing okay.  As mentioned previously, she will see pulmonology, primarily to see if the area in the superior segment of her right lower lobe can potentially be reopened.  Otherwise, I will see her back in 3 weeks before she heads into her 3rd cycle of maintenance Abraxane .  The patient understands all the plans discussed today and is in agreement with them.    Susan Franco Susan Kerns, MD

## 2023-12-01 ENCOUNTER — Inpatient Hospital Stay

## 2023-12-01 ENCOUNTER — Encounter: Payer: Self-pay | Admitting: Oncology

## 2023-12-01 ENCOUNTER — Inpatient Hospital Stay: Attending: Oncology

## 2023-12-01 ENCOUNTER — Other Ambulatory Visit: Payer: Self-pay | Admitting: Pharmacist

## 2023-12-01 ENCOUNTER — Telehealth: Payer: Self-pay | Admitting: Oncology

## 2023-12-01 ENCOUNTER — Inpatient Hospital Stay (HOSPITAL_BASED_OUTPATIENT_CLINIC_OR_DEPARTMENT_OTHER): Admitting: Oncology

## 2023-12-01 VITALS — BP 138/81 | HR 81 | Temp 98.4°F | Resp 16 | Ht 72.0 in | Wt 276.5 lb

## 2023-12-01 DIAGNOSIS — C3490 Malignant neoplasm of unspecified part of unspecified bronchus or lung: Secondary | ICD-10-CM | POA: Insufficient documentation

## 2023-12-01 DIAGNOSIS — R0602 Shortness of breath: Secondary | ICD-10-CM | POA: Diagnosis not present

## 2023-12-01 DIAGNOSIS — C787 Secondary malignant neoplasm of liver and intrahepatic bile duct: Secondary | ICD-10-CM | POA: Diagnosis not present

## 2023-12-01 DIAGNOSIS — Z5111 Encounter for antineoplastic chemotherapy: Secondary | ICD-10-CM | POA: Insufficient documentation

## 2023-12-01 DIAGNOSIS — C3401 Malignant neoplasm of right main bronchus: Secondary | ICD-10-CM

## 2023-12-01 LAB — CMP (CANCER CENTER ONLY)
ALT: <5 U/L (ref 0–44)
AST: 18 U/L (ref 15–41)
Albumin: 3.8 g/dL (ref 3.5–5.0)
Alkaline Phosphatase: 135 U/L — ABNORMAL HIGH (ref 38–126)
Anion gap: 13 (ref 5–15)
BUN: 6 mg/dL — ABNORMAL LOW (ref 8–23)
CO2: 24 mmol/L (ref 22–32)
Calcium: 9.6 mg/dL (ref 8.9–10.3)
Chloride: 99 mmol/L (ref 98–111)
Creatinine: 0.64 mg/dL (ref 0.44–1.00)
GFR, Estimated: >60 mL/min (ref >60)
Glucose, Bld: 116 mg/dL — ABNORMAL HIGH (ref 70–99)
Potassium: 4.3 mmol/L (ref 3.5–5.1)
Sodium: 136 mmol/L (ref 135–145)
Total Bilirubin: 0.4 mg/dL (ref 0.0–1.2)
Total Protein: 7.1 g/dL (ref 6.5–8.1)

## 2023-12-01 LAB — CBC WITH DIFFERENTIAL (CANCER CENTER ONLY)
Abs Immature Granulocytes: 0.08 K/uL — ABNORMAL HIGH (ref 0.00–0.07)
Basophils Absolute: 0 K/uL (ref 0.0–0.1)
Basophils Relative: 1 %
Eosinophils Absolute: 0.1 K/uL (ref 0.0–0.5)
Eosinophils Relative: 1 %
HCT: 29.6 % — ABNORMAL LOW (ref 36.0–46.0)
Hemoglobin: 9.5 g/dL — ABNORMAL LOW (ref 12.0–15.0)
Immature Granulocytes: 1 %
Lymphocytes Relative: 15 %
Lymphs Abs: 0.9 K/uL (ref 0.7–4.0)
MCH: 30.1 pg (ref 26.0–34.0)
MCHC: 32.1 g/dL (ref 30.0–36.0)
MCV: 93.7 fL (ref 80.0–100.0)
Monocytes Absolute: 0.6 K/uL (ref 0.1–1.0)
Monocytes Relative: 9 %
Neutro Abs: 4.5 K/uL (ref 1.7–7.7)
Neutrophils Relative %: 73 %
Platelet Count: 249 K/uL (ref 150–400)
RBC: 3.16 MIL/uL — ABNORMAL LOW (ref 3.87–5.11)
RDW: 15.3 % (ref 11.5–15.5)
WBC Count: 6.1 K/uL (ref 4.0–10.5)
nRBC: 0 % (ref 0.0–0.2)

## 2023-12-01 LAB — MAGNESIUM: Magnesium: 1.6 mg/dL — ABNORMAL LOW (ref 1.7–2.4)

## 2023-12-01 MED ORDER — SODIUM CHLORIDE 0.9% FLUSH: 10.0000 mL | INTRAVENOUS | Status: DC | PRN

## 2023-12-01 MED ORDER — DEXAMETHASONE SODIUM PHOSPHATE 10 MG/ML IJ SOLN
4.0000 mg | Freq: Once | INTRAMUSCULAR | Status: AC
  Administered 2023-12-01: 4 mg via INTRAVENOUS
  Filled 2023-12-01: qty 1

## 2023-12-01 MED ORDER — MAGNESIUM SULFATE 2 GM/50ML IV SOLN
2.0000 g | Freq: Once | INTRAVENOUS | Status: AC
  Administered 2023-12-01: 2 g via INTRAVENOUS
  Filled 2023-12-01: qty 50

## 2023-12-01 MED ORDER — PACLITAXEL PROTEIN-BOUND CHEMO INJECTION 100 MG
156.0000 mg/m2 | Freq: Once | INTRAVENOUS | Status: AC
  Administered 2023-12-01: 400 mg via INTRAVENOUS
  Filled 2023-12-01: qty 80

## 2023-12-01 MED ORDER — ONDANSETRON HCL 4 MG/2ML IJ SOLN
8.0000 mg | Freq: Once | INTRAMUSCULAR | Status: AC
  Administered 2023-12-01: 8 mg via INTRAVENOUS
  Filled 2023-12-01: qty 4

## 2023-12-01 MED ORDER — SODIUM CHLORIDE 0.9 % IV SOLN
INTRAVENOUS | Status: DC
Start: 2023-12-01 — End: 2023-12-01

## 2023-12-01 NOTE — Progress Notes (Signed)
 Add dexamethasone  back into treatment plan per patient request but decrease dose to 4 mg only as premedication due to history of trouble sleeping with 10 mg.

## 2023-12-01 NOTE — Telephone Encounter (Signed)
 Patient has been scheduled for follow-up visit per 12/01/23 LOS.  Pt given an appt calendar with date and time.

## 2023-12-01 NOTE — Patient Instructions (Signed)
 Hypomagnesemia Hypomagnesemia is a condition in which the level of magnesium  in the blood is too low. Magnesium  is a mineral that is found in many foods. It is used in many different processes in the body. Hypomagnesemia can affect every organ in the body. In severe cases, it can cause life-threatening problems. What are the causes? This condition may be caused by: Not getting enough magnesium  in your diet or not having enough healthy foods to eat (malnutrition). Problems with magnesium  absorption in the intestines. Dehydration. Excessive use of alcohol. Vomiting. Severe or long-term (chronic) diarrhea. Some medicines, including medicines that make you urinate more often (diuretics). Certain diseases, such as kidney disease, diabetes, celiac disease, and overactive thyroid . What are the signs or symptoms? Symptoms of this condition include: Loss of appetite, nausea, and vomiting. Involuntary shaking or trembling of a body part (tremor). Muscle weakness or tingling in the arms and legs. Sudden tightening of muscles (muscle spasms). Confusion. Psychiatric issues, such as: Depression and irritability. Psychosis. A feeling of fluttering of the heart (palpitations). Seizures. These symptoms are more severe if magnesium  levels drop suddenly. How is this diagnosed? This condition may be diagnosed based on: Your symptoms and medical history. A physical exam. Blood and urine tests. How is this treated? Treatment depends on the cause and the severity of the condition. It may be treated by: Taking a magnesium  supplement. This can be taken in pill form. If the condition is severe, magnesium  is usually given through an IV. Making changes to your diet. You may be directed to eat foods that have a lot of magnesium , such as green leafy vegetables, peas, beans, and nuts. Not drinking alcohol. If you are struggling not to drink, ask your health care provider for help. Follow these instructions at  home: Eating and drinking     Make sure that your diet includes foods with magnesium . Foods that have a lot of magnesium  in them include: Green leafy vegetables, such as spinach and broccoli. Beans and peas. Nuts and seeds, such as almonds and sunflower seeds. Whole grains, such as whole grain bread and fortified cereals. Drink fluids that contain salts and minerals (electrolytes), such as sports drinks, when you are active. Do not drink alcohol. General instructions Take over-the-counter and prescription medicines only as told by your health care provider. Take magnesium  supplements as directed if your health care provider tells you to take them. Have your magnesium  levels monitored as told by your health care provider. Keep all follow-up visits. This is important. Contact a health care provider if: You get worse instead of better. Your symptoms return. Get help right away if: You develop severe muscle weakness. You have trouble breathing. You feel that your heart is racing. These symptoms may represent a serious problem that is an emergency. Do not wait to see if the symptoms will go away. Get medical help right away. Call your local emergency services (911 in the [place] Do not drive yourself to the hospital. Summary Hypomagnesemia is a condition in which the level of magnesium  in the blood is too low. Hypomagnesemia can affect every organ in the body. Treatment may include eating more foods that contain magnesium , taking magnesium  supplements, and not drinking alcohol. Have your magnesium  levels monitored as told by your health care provider. This information is not intended to replace advice given to you by your health care provider. Make sure you discuss any questions you have with your health care provider. Document Revised: 08/12/2020 Document Reviewed: 08/12/2020 Elsevier Patient Education  2024 Else[employer]Paclitaxel  Nanoparticle Albumin -Bound Injection What is this  medication? NANOPARTICLE ALBUMIN -BOUND PACLITAXEL  (Na no PAHR ti kuhl al BYOO muhn-bound PAK li TAX el) treats some types of cancer. It works by slowing down the growth of cancer cells. This medicine may be used for other purposes; ask your health care provider or pharmacist if you have questions. COMMON BRAND NAME(S): Abraxane  What should I tell my care team before I take this medication? They need to know if you have any of these conditions: Liver disease Low white blood cell levels An unusual or allergic reaction to paclitaxel , albumin , other medications, foods, dyes, or preservatives If you or your partner are pregnant or trying to get pregnant Breast-feeding How should I use this medication? This medication is injected into a vein. It is given by your care team in a hospital or clinic setting. Talk to your care team about the use of this medication in children. Special care may be needed. Overdosage: If you think you have taken too much of this medicine contact a poison control center or emergency room at once. NOTE: This medicine is only for you. Do not share this medicine with others. What if I miss a dose? Keep appointments for follow-up doses. It is important not to miss your dose. Call your care team if you are unable to keep an appointment. What may interact with this medication? Other medications may affect the way this medication works. Talk with your care team about all of the medications you take. They may suggest changes to your treatment plan to lower the risk of side effects and to make sure your medications work as intended. This list may not describe all possible interactions. Give your health care provider a list of all the medicines, herbs, non-prescription drugs, or dietary supplements you use. Also tell them if you smoke, drink alcohol, or use illegal drugs. Some items may interact with your medicine. What should I watch for while using this medication? Your condition  will be monitored carefully while you are receiving this medication. You may need blood work while taking this medication. This medication may make you feel generally unwell. This is not uncommon as chemotherapy can affect healthy cells as well as cancer cells. Report any side effects. Continue your course of treatment even though you feel ill unless your care team tells you to stop. This medication can cause serious allergic reactions. To reduce the risk, your care team may give you other medications to take before receiving this one. Be sure to follow the directions from your care team. This medication may increase your risk of getting an infection. Call your care team for advice if you get a fever, chills, sore throat, or other symptoms of a cold or flu. Do not treat yourself. Try to avoid being around people who are sick. This medication may increase your risk to bruise or bleed. Call your care team if you notice any unusual bleeding. Be careful brushing or flossing your teeth or using a toothpick because you may get an infection or bleed more easily. If you have any dental work done, tell your dentist you are receiving this medication. Talk to your care team if you or your partner may be pregnant. Serious birth defects can occur if you take this medication during pregnancy and for 6 months after the last dose. You will need a negative pregnancy test before starting this medication. Contraception is recommended while taking this medication and for 6 months after the last dose.  Your care team can help you find the option that works for you. If your partner can get pregnant, use a condom during sex while taking this medication and for 3 months after the last dose. Do not breastfeed while taking this medication and for 2 weeks after the last dose. This medication may cause infertility. Talk to your care team if you are concerned about your fertility. What side effects may I notice from receiving this  medication? Side effects that you should report to your care team as soon as possible: Allergic reactions--skin rash, itching, hives, swelling of the face, lips, tongue, or throat Dry cough, shortness of breath or trouble breathing Infection--fever, chills, cough, sore throat, wounds that don't heal, pain or trouble when passing urine, general feeling of discomfort or being unwell Low red blood cell level--unusual weakness or fatigue, dizziness, headache, trouble breathing Pain, tingling, or numbness in the hands or feet Stomach pain, unusual weakness or fatigue, nausea, vomiting, diarrhea, or fever that lasts longer than expected Unusual bruising or bleeding Side effects that usually do not require medical attention (report to your care team if they continue or are bothersome): Diarrhea Fatigue Hair loss Loss of appetite Nausea Vomiting This list may not describe all possible side effects. Call your doctor for medical advice about side effects. You may report side effects to FDA at 1-800-FDA-1088. Where should I keep my medication? This medication is given in a hospital or clinic. It will not be stored at home. NOTE: This sheet is a summary. It may not cover all possible information. If you have questions about this medicine, talk to your doctor, pharmacist, or health care provider.  2024 Elsevier/Gold Standard (2021-07-30 00:00:00)

## 2023-12-02 ENCOUNTER — Other Ambulatory Visit: Payer: Self-pay

## 2023-12-02 ENCOUNTER — Encounter: Payer: Self-pay | Admitting: Oncology

## 2023-12-02 ENCOUNTER — Other Ambulatory Visit (HOSPITAL_BASED_OUTPATIENT_CLINIC_OR_DEPARTMENT_OTHER): Payer: Self-pay

## 2023-12-04 ENCOUNTER — Encounter: Payer: Self-pay | Admitting: Oncology

## 2023-12-12 ENCOUNTER — Telehealth: Payer: Self-pay

## 2023-12-12 NOTE — Telephone Encounter (Signed)
 Pt's sister, [name], called to report that Susan Franco has been having a rough time since last chemo. She is unable to sleep, even with the Ambien . It seems like it takes the Ambien  6 hrs to kick in, and it doesn't last long. She is also having increased anxiety @ night, even with use of Xanax  0.25mg  BID PRN. Please advise.

## 2023-12-20 ENCOUNTER — Other Ambulatory Visit: Payer: Self-pay

## 2023-12-20 NOTE — Progress Notes (Signed)
 Rheumatology Follow Up Note  Chief Complaint  Patient presents with  . Psoriatic Arthritis      Subjective:Medication Refill Pertinent negatives include no chest pain, coughing, fatigue, headaches, nausea, numbness, rash or weakness.  Shoulder Pain  Pertinent negatives include no numbness.    Susan Franco is a 63 y.o. female is here today for follow up of psoriasis, moderate psoriatic arthritis. The patient's allergies, current medications, past family history, past medical history, past social history, past surgical history and problem list were reviewed and updated as appropriate.   She is currently on chemotherapy for lung cancer with mets. She is off the Xeljanz .  She is having pain of the knees, worse is on the left. The right knee cortisone injection did help the pain.   She has chronic mcp joint swelling. This has not changed The psoriasis is stable has not gotten worse.. She has no inflammation of the eyes. She has no achilles pain or swelling. She has no blood clots, heart attack, or stroke since last evaluation. She has no fever or infection or GI trouble.   Review of Systems:   Review of Systems  Constitutional:  Negative for fatigue.  HENT:  Negative for mouth sores and trouble swallowing.        Neg: Dry Mouth  Eyes:  Negative for redness.       Neg: Dry Eyes  Respiratory:  Negative for cough and shortness of breath.   Cardiovascular:  Negative for chest pain and leg swelling.  Gastrointestinal:  Positive for diarrhea. Negative for constipation and nausea.  Endocrine: Negative for cold intolerance and heat intolerance.  Genitourinary:  Negative for hematuria.  Musculoskeletal:        Per HPI  Skin:  Negative for color change and rash.  Neurological:  Negative for dizziness, weakness, numbness and headaches.  Hematological:  Does not bruise/bleed easily.  Psychiatric/Behavioral:  Negative for dysphoric mood and sleep disturbance. The patient is nervous/anxious.    All other systems reviewed and are negative.  Objective:  Vitals:   12/20/23 1341  BP: 111/74  Temp: 36.2 C (97.1 F)  TempSrc: Temporal  Weight: (!) 119.3 kg (263 lb 0.1 oz)  Height: 185.4 cm (6' 1)  PainSc:   7   Length of Stiffness: Several Hours  GEN - Pleasant, No Apparent Distress  HEENT - normocephalic and atraumatic. Conjunctiva Clear.  Neck - supple with no adenopathy or thyromegaly.   C spine with full range of motion. Heart - regular rate and rhythm, No murmurs/gallops/rub, Nml S1S2 Lungs - clear to auscultation in all fields. Extremities - there is no cyanosis or edema.  Neurological - alert and oriented.  Spine - moderate paraspinal tenderness; L spine with pain Skin - Elbows and Legs with mild Psoriasis; Lower legs with improvement  MSK - The following joints were examined bilaterally: Hands, Wrists, Elbows, Shoulders, Metatarsals, Ankles, Knees and Hips; they were normal apart from what is noted.   100% Fist Formation CMC Squaring Chronic PIP Changes with Tenderness Right 2-3 MCP Chronic Synovial Changes; Tendenress  Left shoulder with stiffness and limited range of motion  Right Knee with Crepitus, Stiffness Left Knee with mild crepitus, no effusion No Dactylitis No Tender Point Gait Using Cane   Labs/Imaging Reviewed in EMR Cr 0.64, Ast 18, ALT <5 CBC with Hgb 9.5, Hct 29.6, Plt 249 ESR 12 Neg: RA, AntiCCP   Hand Xray: Erosions   Lumbar Xray: mild facet arthritis  DEXA Scan (03/2021): Normal  Assessment and Plan   1) Psoriasis with Psoriatic Arthritis: Moderate: Stable -- Medical records  Dr. CINDY SETTER.  Cardiology [facility] medical associate.  Date of encounter 12/21/2018, patient presented with seronegative inflammatory arthritis which later developed psoriasis treated as psoriatic arthritis. -- Diagnosed October 2017 with swelling and pain of the hands and feet slight asymmetric presentation in the beginning but also psoriasis in  scalp elbow. -- Failed medications, lost efficacy Humira, Orencia, Simponi, Cimzia; Stelara -- 10/2018: started Cosentyx:rash 4-5 days after cosentyx, no loading dose, then 2nd dose of Cosentyx+ benadryl  with no subsequent rashes. -- Overall tried: 3 TNFs+ Orencia+ Cosentyx (IL6, rash? Allergic reaction) -- Failed Methotrexate -- Off Xeljanz  while on chemotherapy  2) Long Term use of high risk medication -- On immunosuppressive medications that requires drug toxicity monitoring -- Lung Cancer with mets followed by oncology on chemotherapy -- Reviewed labs  1. Psoriatic arthritis (CMS/HHS-HCC)   2. Psoriasis   3. Squamous cell carcinoma of right lung (CMS/HHS-HCC)   4. Primary osteoarthritis of left knee    I personally performed the service. (TP)  MAYUR LOREE BLANCH, MD

## 2023-12-21 NOTE — Progress Notes (Unsigned)
 [facility] at [facility] [address] [place],  [place]  [address] [phone]  Clinic Day: 12/01/2023  Referring physician: Sabas Norleen PARAS., MD   HISTORY OF PRESENT ILLNESS:  The patient is a 63 y.o. female with stage IVB (T2a N2a M1c1) squamous cell lung cancer, which includes spread of disease to her liver.  She comes in today to be re-evaluated before heading into her 3rd cycle of maintenance Abraxane .  This was preceded by 6 cycles of palliative carboplatin -based chemotherapy.  Of note, the patient had an allergic reaction to paclitaxel  after her fourth cycle of treatment.  Based upon this, she was switched to carboplatin /Abraxane  for her 5th and 6th cycles.  At her visit last week, she was extremely dyspneic with minimal exertion, which led to her being evaluated in the emergency room.  Her workup was unremarkable. The patient states she has had dyspnea with minimal exertion for years, even before her lung cancer diagnosis was made.  She is scheduled to see pulmonology later this month.    PHYSICAL EXAM:  Last menstrual period 11/15/2011. Wt Readings from Last 3 Encounters:  12/01/23 276 lb 8 oz (125.4 kg)  11/24/23 272 lb 6.4 oz (123.6 kg)  11/03/23 267 lb (121.1 kg)   There is no height or weight on file to calculate BMI. Performance status (ECOG): 2 - Symptomatic, <50% confined to bed Physical Exam Constitutional:      Appearance: Normal appearance. She is not ill-appearing.     Comments: A pleasant woman in a wheelchair.  However, she gets extremely dyspneic with minor ambulation.    HENT:     Mouth/Throat:     Mouth: Mucous membranes are moist.     Pharynx: Oropharynx is clear. No oropharyngeal exudate or posterior oropharyngeal erythema.  Cardiovascular:     Rate and Rhythm: Normal rate and regular rhythm.     Heart sounds: No murmur heard.    No friction rub. No gallop.  Pulmonary:     Effort: Pulmonary effort is normal. No respiratory distress.      Breath sounds: No wheezing, rhonchi or rales.  Abdominal:     General: Bowel sounds are normal. There is no distension.     Palpations: Abdomen is soft. There is no mass.     Tenderness: There is no abdominal tenderness.  Musculoskeletal:        General: No swelling.     Right lower leg: No edema.     Left lower leg: No edema.  Lymphadenopathy:     Cervical: No cervical adenopathy.     Upper Body:     Right upper body: No supraclavicular or axillary adenopathy.     Left upper body: No supraclavicular or axillary adenopathy.     Lower Body: No right inguinal adenopathy. No left inguinal adenopathy.  Skin:    General: Skin is warm.     Coloration: Skin is not jaundiced.     Findings: No lesion or rash.  Neurological:     General: No focal deficit present.     Mental Status: She is alert and oriented to person, place, and time. Mental status is at baseline.  Psychiatric:        Mood and Affect: Mood normal.        Behavior: Behavior normal.        Thought Content: Thought content normal.    LABS:      Latest Ref Rng & Units 12/01/2023   10:52 AM 11/24/2023  9:07 AM 11/09/2023   11:09 AM  CBC  WBC 4.0 - 10.5 K/uL 6.1  5.8  5.7   Hemoglobin 12.0 - 15.0 g/dL 9.5  9.8  89.5   Hematocrit 36.0 - 46.0 % 29.6  30.3  31.6   Platelets 150 - 400 K/uL 249  232  212       Latest Ref Rng & Units 12/01/2023   10:52 AM 11/24/2023    9:07 AM 11/09/2023   11:09 AM  CMP  Glucose 70 - 99 mg/dL 883  859  843   BUN 8 - 23 mg/dL 6  5  11    Creatinine 0.44 - 1.00 mg/dL 9.35  9.42  9.35   Sodium 135 - 145 mmol/L 136  137  136   Potassium 3.5 - 5.1 mmol/L 4.3  3.4  3.0   Chloride 98 - 111 mmol/L 99  98  98   CO2 22 - 32 mmol/L 24  26  24    Calcium 8.9 - 10.3 mg/dL 9.6  9.8  9.3   Total Protein 6.5 - 8.1 g/dL 7.1  7.1  7.1   Total Bilirubin 0.0 - 1.2 mg/dL 0.4  0.3  0.5   Alkaline Phos 38 - 126 U/L 135  132  117   AST 15 - 41 U/L 18  17  23    ALT 0 - 44 U/L 5  11  17      ASSESSMENT &  PLAN:  Assessment/Plan:  A 63 y.o. female with stage IVB (T2a N2a M1c1) squamous cell lung cancer, which includes spread of disease to her liver. She will proceed with her 2nd cycle of maintenance Abraxane  today, which she will receive every 3 weeks.  Clinically, she appears to be doing okay.  As mentioned previously, she will see pulmonology, primarily to see if the area in the superior segment of her right lower lobe can potentially be reopened.  Otherwise, I will see her back in 3 weeks before she heads into her 3rd cycle of maintenance Abraxane .  The patient understands all the plans discussed today and is in agreement with them.    James Lafalce DELENA Kerns, MD

## 2023-12-22 ENCOUNTER — Inpatient Hospital Stay

## 2023-12-22 ENCOUNTER — Encounter: Payer: Self-pay | Admitting: Oncology

## 2023-12-22 ENCOUNTER — Other Ambulatory Visit: Payer: Self-pay

## 2023-12-22 ENCOUNTER — Other Ambulatory Visit: Payer: Self-pay | Admitting: Oncology

## 2023-12-22 ENCOUNTER — Inpatient Hospital Stay (HOSPITAL_BASED_OUTPATIENT_CLINIC_OR_DEPARTMENT_OTHER): Admitting: Oncology

## 2023-12-22 VITALS — BP 124/86 | HR 91 | Temp 99.1°F | Resp 16 | Ht 72.0 in | Wt 273.5 lb

## 2023-12-22 DIAGNOSIS — C3401 Malignant neoplasm of right main bronchus: Secondary | ICD-10-CM

## 2023-12-22 DIAGNOSIS — Z5111 Encounter for antineoplastic chemotherapy: Secondary | ICD-10-CM | POA: Diagnosis not present

## 2023-12-22 LAB — CBC WITH DIFFERENTIAL (CANCER CENTER ONLY)
Abs Immature Granulocytes: 0.12 K/uL — ABNORMAL HIGH (ref 0.00–0.07)
Basophils Absolute: 0 K/uL (ref 0.0–0.1)
Basophils Relative: 1 %
Eosinophils Absolute: 0.1 K/uL (ref 0.0–0.5)
Eosinophils Relative: 2 %
HCT: 30.7 % — ABNORMAL LOW (ref 36.0–46.0)
Hemoglobin: 10.1 g/dL — ABNORMAL LOW (ref 12.0–15.0)
Immature Granulocytes: 3 %
Lymphocytes Relative: 18 %
Lymphs Abs: 0.9 K/uL (ref 0.7–4.0)
MCH: 29.8 pg (ref 26.0–34.0)
MCHC: 32.9 g/dL (ref 30.0–36.0)
MCV: 90.6 fL (ref 80.0–100.0)
Monocytes Absolute: 0.5 K/uL (ref 0.1–1.0)
Monocytes Relative: 10 %
Neutro Abs: 3.2 K/uL (ref 1.7–7.7)
Neutrophils Relative %: 66 %
Platelet Count: 249 K/uL (ref 150–400)
RBC: 3.39 MIL/uL — ABNORMAL LOW (ref 3.87–5.11)
RDW: 14.6 % (ref 11.5–15.5)
WBC Count: 4.8 K/uL (ref 4.0–10.5)
nRBC: 0 % (ref 0.0–0.2)

## 2023-12-22 LAB — CMP (CANCER CENTER ONLY)
ALT: 12 U/L (ref 0–44)
AST: 20 U/L (ref 15–41)
Albumin: 3.9 g/dL (ref 3.5–5.0)
Alkaline Phosphatase: 124 U/L (ref 38–126)
Anion gap: 14 (ref 5–15)
BUN: 8 mg/dL (ref 8–23)
CO2: 22 mmol/L (ref 22–32)
Calcium: 10.1 mg/dL (ref 8.9–10.3)
Chloride: 95 mmol/L — ABNORMAL LOW (ref 98–111)
Creatinine: 0.71 mg/dL (ref 0.44–1.00)
GFR, Estimated: >60 mL/min (ref >60)
Glucose, Bld: 130 mg/dL — ABNORMAL HIGH (ref 70–99)
Potassium: 4.1 mmol/L (ref 3.5–5.1)
Sodium: 131 mmol/L — ABNORMAL LOW (ref 135–145)
Total Bilirubin: 0.3 mg/dL (ref 0.0–1.2)
Total Protein: 7.3 g/dL (ref 6.5–8.1)

## 2023-12-22 LAB — URINALYSIS, COMPLETE (UACMP) WITH MICROSCOPIC
Bilirubin Urine: NEGATIVE
Glucose, UA: NEGATIVE mg/dL
Hgb urine dipstick: NEGATIVE
Ketones, ur: NEGATIVE mg/dL
Leukocytes,Ua: NEGATIVE
Nitrite: NEGATIVE
Protein, ur: NEGATIVE mg/dL
Specific Gravity, Urine: 1.005 (ref 1.005–1.030)
pH: 6 (ref 5.0–8.0)

## 2023-12-22 LAB — MAGNESIUM: Magnesium: 1.6 mg/dL — ABNORMAL LOW (ref 1.7–2.4)

## 2023-12-23 ENCOUNTER — Telehealth: Payer: Self-pay

## 2023-12-23 LAB — URINE CULTURE: Culture: <10000 — AB

## 2023-12-23 NOTE — Telephone Encounter (Signed)
 Latest Reference Range & Units 12/22/23 11:13  Appearance CLEAR  CLEAR  Bilirubin Urine NEGATIVE  NEGATIVE  Color, Urine YELLOW  YELLOW  Glucose, UA NEGATIVE mg/dL NEGATIVE  Hgb urine dipstick NEGATIVE  NEGATIVE  Ketones, ur NEGATIVE mg/dL NEGATIVE  Leukocytes,Ua NEGATIVE  NEGATIVE  Nitrite NEGATIVE  NEGATIVE  pH 5.0 - 8.0  6.0  Protein NEGATIVE mg/dL NEGATIVE  Specific Gravity, Urine 1.005 - 1.030  1.005  Bacteria, UA NONE SEEN  FEW !  RBC / HPF 0 - 5 RBC/hpf 0-5  Squamous Epithelial / HPF 0 - 5 /HPF 6-10  WBC, UA 0 - 5 WBC/hpf 0-5  Urine Culture  Rpt !  !: Data is abnormal Rpt: View report in Results Review for more information

## 2023-12-26 ENCOUNTER — Encounter: Payer: Self-pay | Admitting: Oncology

## 2023-12-26 NOTE — Progress Notes (Unsigned)
 Susan Franco, female    DOB: Mar 16, 1961   MRN: [id number]   Brief patient profile:  63 yowf quit smoking 2022  referred to pulmonary clinic 12/27/2023 by [name]  for sob since before covid on trelelgy/ACEi    Baseline wt 140 at age 63  p last child   Prev w/u for unexplained doe neg in [place] by cards/ pulmonary ? 2022?  - does not remember ever being tried off ACEi  Bx liver 05/10/23  PDNSC ca    History of Present Illness  12/27/2023  Pulmonary/ 1st office eval/Nirvi Boehler on lisinopril  /trelegy  Pt not previously seen by PCCM service.   Chief Complaint  Patient presents with   Consult    Referred by Dr. [name]. Pt c/o DOE for the past 10 years. She gets winded walking room to room.   Dyspnea:  room to room at home - uses w/c to go out  Cough: less since stopped smoking  Sleep: flat bed / 3 pillows  feels like throat closing up freq at HS and sits p in recliner for sev hours to get relief   SABA use: none 02 [id number]     No obvious day to day or daytime pattern/variability or assoc excess/ purulent sputum or mucus plugs or hemoptysis or cp or chest tightness, subjective wheeze or overt sinus or hb symptoms.    Also denies any obvious fluctuation of symptoms with weather or environmental changes or other aggravating or alleviating factors except as outlined above   No unusual exposure hx or h/o childhood pna/ asthma or knowledge of premature birth.  Current Allergies, Complete Past Medical History, Past Surgical History, Family History, and Social History were reviewed in [employer] record.  ROS  The following are not active complaints unless bolded Hoarseness, sore throat, dysphagia=globus, dental problems, itching, sneezing,  nasal congestion or discharge of excess mucus or purulent secretions, ear ache,   fever, chills, sweats, unintended wt loss or wt gain, classically pleuritic or exertional cp,  orthopnea pnd or arm/hand swelling  or leg swelling,  presyncope, palpitations, abdominal pain, anorexia, nausea, vomiting, diarrhea  or change in bowel habits or change in bladder habits, change in stools or change in urine, dysuria, hematuria,  rash, arthralgias, visual complaints, headache, numbness, weakness or ataxia or problems with walking or coordination,  change in mood or  memory.             Outpatient Medications Prior to Visit  Medication Sig Dispense Refill   ALPRAZolam  (XANAX ) 0.25 MG tablet Take 0.5 mg by mouth 2 (two) times daily as needed for anxiety.     amLODipine  (NORVASC ) 5 MG tablet Take 5 mg by mouth daily.     atenolol  (TENORMIN ) 50 MG tablet Take 50 mg by mouth daily.     dexamethasone  (DECADRON ) 4 MG tablet Take 2 tablets (8mg ) by mouth daily starting the day after carboplatin  for 3 days. Take with food 30 tablet 1   Fluticasone-Umeclidin-Vilant (TRELEGY ELLIPTA IN) Inhale 1 puff into the lungs daily.     HYDROcodone  bit-homatropine (HYCODAN) 5-1.5 MG/5ML syrup Take 5 mLs by mouth every 6 (six) hours as needed for cough. 473 mL 0   lidocaine -alum & mag hydroxide-simeth-nystatin -diphenhydrAMINE -prednisoLONE  Swish and swallow or spit out up to 4 (four) times daily if needed for thrush 250 mL 0   lidocaine -prilocaine  (EMLA ) cream Apply to affected area once 30 g 3   lisinopril  (PRINIVIL ,ZESTRIL ) 20 MG tablet Take 20 mg by  mouth daily.     loperamide (IMODIUM A-D) 2 MG tablet Take 2 mg by mouth 4 (four) times daily as needed for diarrhea or loose stools.     loratadine  (CLARITIN ) 10 MG tablet Take 1 tablet (10 mg total) by mouth daily. For bone pain associated with udenyca  injection 90 tablet 0   magnesium  oxide (MAG-OX) 400 (240 Mg) MG tablet Take 1 tablet (400 mg total) by mouth 2 (two) times daily. 120 tablet 0   OLANZapine  (ZYPREXA ) 5 MG tablet Take 1 tablet (5 mg total) by mouth at bedtime. For anxiety, nausea, and appetite related to cancer & chemotherapy 30 tablet 0   omeprazole (PRILOSEC) 40 MG capsule Take 40 mg by  mouth daily.     ondansetron  (ZOFRAN ) 8 MG tablet Take 1 tablet (8 mg total) by mouth every 8 (eight) hours as needed for nausea or vomiting. Start on the third day after carboplatin . 30 tablet 1   oxyCODONE  (OXY IR/ROXICODONE ) 5 MG immediate release tablet Take 1 tablet (5 mg total) by mouth every 3 (three) hours as needed for moderate pain ((score 4 to 6)). 30 tablet 0   prochlorperazine  (COMPAZINE ) 10 MG tablet Take 1 tablet (10 mg total) by mouth every 6 (six) hours as needed for nausea or vomiting. 30 tablet 1   sennosides-docusate sodium  (SENOKOT-S) 8.6-50 MG tablet Take 1 tablet by mouth in the morning and at bedtime.     spironolactone  (ALDACTONE ) 25 MG tablet Take 25 mg by mouth daily.     venlafaxine  XR (EFFEXOR -XR) 150 MG 24 hr capsule Take 150 mg by mouth daily.     zolpidem  (AMBIEN ) 5 MG tablet Take 1 tablet (5 mg total) by mouth at bedtime as needed for sleep. 30 tablet 0   No facility-administered medications prior to visit.    Past Medical History:  Diagnosis Date   Anemia    Anxiety    Depression    GERD (gastroesophageal reflux disease)    History of EKG 08/2011   was reflux-done at [facility] hospital-will request   Hypertension    atenolol  and norvasc    Lung cancer (HCC)    Psoriasis    Psoriatic arthritis (HCC) 2020   [facility]   Rheumatoid arthritis (HCC) 2017   Seasonal allergies    Smoker    SOB (shortness of breath)       Objective:     BP 106/68   Pulse 80   Ht 6' (1.829 m)   Wt 271 lb (122.9 kg)   LMP 11/15/2011   SpO2 96% Comment: on RA  BMI 36.75 kg/m   SpO2: 96 % (on RA)  w/c bound  middle aged mod obese (by BMI) wf nad    HEENT : Oropharynx  clear      Nasal turbinates nl    NECK :  without  apparent JVD/ palpable Nodes/TM    LUNGS: no acc muscle use,  Nl contour chest which is clear to A and P bilaterally without cough on insp or exp maneuvers   CV:  RRR  no s3 or murmur or increase in P2, and trace pitting bilateral LEs  ABD:   soft and nontender   MS:  Gait warm   ext warm without deformities Or obvious joint restrictions  calf tenderness, cyanosis or clubbing    SKIN: warm and dry without lesions    NEURO:  alert, approp, nl sensorium with  no motor or cerebellar deficits apparent.  CXR PA and Lateral:   12/27/2023 :    I personally reviewed images and impression is as follows:  R hilar densities not appreciated as much on the lateral view but overall aeration of both RML and RLL appear improved       Assessment   Assessment & Plan Upper airway cough syndrome Onset  ? 2015, well prior to covid, with neg w/u by pulmonary / cardiology in [place]  - 12/27/2023 try off acei and DPI   Note cc of  throat closing up at hs  is classic for Upper airway cough syndrome (previously labeled PNDS),  is so named because it's frequently impossible to sort out how much is  CR/sinusitis with freq throat clearing (which can be related to primary GERD)   vs  causing  secondary ( extra esophageal)  GERD from wide swings in gastric pressure that occur with throat clearing, often  promoting self use of mint and menthol  lozenges that reduce the lower esophageal sphincter tone and exacerbate the problem further in a cyclical fashion.  These are the same pts (now being labeled as having irritable larynx syndrome by some cough centers) who not infrequently have a history of having failed to tolerate ace inhibitors,  dry powder inhalers(lke trelegy) or biphosphonates or report having atypical/extraesophageal reflux symptoms from LPR (globus, throat clearing)  that don't respond to standard doses of PPI  and are easily confused as having aecopd or asthma flares by even experienced allergists/ pulmonologists (myself included).    Cancer of hilus of right lung (HCC) Dx 04/2013 squamous cell ca rx chemo s RT   I note her RML and RLL appear better aerated on cxr today and I hear good air movement s localized wheeze so I don't see an  indication for bronchospy at this point but happy to arrange for it if/ when needed to guide oncology rx.    Asthmatic bronchitis , chronic (HCC) Quit smoking 2022 with report from [place] pulmonary  nothing wrong with lungs - requested last pfts from [facility]  12/27/2023 >>> - 12/27/2023  After extensive coaching inhaler device,  effectiveness =    75% (short ti)     >>>  D/c dpi due to UACS and started breztri samples/ rx if improving    Essential hypertension Changed ACEi to arb 12/27/2023 due to UACS   In the best review of chronic cough to date ( NEJM 2016 375 8455-8448) ,  ACEi are now felt to cause cough in up to  20% of pts which is a 4 fold increase from previous reports and does not include the variety of non-specific complaints we see in pulmonary clinic in pts on ACEi but previously attributed to another dx like  Copd/asthma and  include PNDS, throat and chest congestion, bronchitis, unexplained dyspnea and noct strangling/choking sensations, and hoarseness, but also  atypical /refractory GERD symptoms like dysphagia and bad heartburn   The only way I know  to prove this is not an ACEi Case is a trial off ACEi x a minimum of 6 weeks then regroup.   >>> try valsartan 160 mg one half daily and ok to use full tablet daily if finds bp not as well controlled as on ACEi         Each maintenance medication was reviewed in detail including emphasizing most importantly the difference between maintenance and prns and under what circumstances the prns are to be triggered using an action plan format where appropriate.  Total time  for H and P, chart review, counseling, reviewing hfa  device(s) and generating customized AVS unique to this office visit / same day charting = 65 min new pt with multiple chronic  refractory respiratory  symptoms of uncertain etiology          AVS  Patient Instructions  Stop trelegy and lisinopril    Valsartan 160 mg take one half  daily and then increase  to a whole pill if needed  Breztri up to 2 puffs every 12 hours  to take the place of trelegy   Work on inhaler technique:  relax and gently blow all the way out then take a nice smooth full deep breath back in, triggering the inhaler at same time you start breathing in.  Hold breath in for at least  5 seconds if you can. Blow out breztri thru nose. Rinse and gargle with water when done.  If mouth or throat bother you at all,  try brushing teeth/gums/tongue with arm and hammer toothpaste/ make a slurry and gargle and spit out.     Please remember to go to the  x-ray department  for your tests - we will call you with the results when they are available    Sign for pfts from Dr Chodri's office    Please schedule a follow up office visit in 4 weeks, sooner if needed      Ozell America, MD 12/28/2023

## 2023-12-27 ENCOUNTER — Encounter: Payer: Self-pay | Admitting: Internal Medicine

## 2023-12-27 ENCOUNTER — Ambulatory Visit (INDEPENDENT_AMBULATORY_CARE_PROVIDER_SITE_OTHER)

## 2023-12-27 ENCOUNTER — Ambulatory Visit: Admitting: Internal Medicine

## 2023-12-27 VITALS — BP 106/68 | HR 80 | Ht 72.0 in | Wt 271.0 lb

## 2023-12-27 DIAGNOSIS — C3401 Malignant neoplasm of right main bronchus: Secondary | ICD-10-CM | POA: Diagnosis not present

## 2023-12-27 DIAGNOSIS — J4489 Other specified chronic obstructive pulmonary disease: Secondary | ICD-10-CM | POA: Diagnosis not present

## 2023-12-27 DIAGNOSIS — R058 Other specified cough: Secondary | ICD-10-CM | POA: Diagnosis not present

## 2023-12-27 DIAGNOSIS — I10 Essential (primary) hypertension: Secondary | ICD-10-CM

## 2023-12-27 MED ORDER — BREZTRI AEROSPHERE 160-9-4.8 MCG/ACT IN AERO: 2.0000 | INHALATION_SPRAY | Freq: Two times a day (BID) | RESPIRATORY_TRACT | 11 refills | Status: AC

## 2023-12-27 MED ORDER — BREZTRI AEROSPHERE 160-9-4.8 MCG/ACT IN AERO: 2.0000 | INHALATION_SPRAY | Freq: Two times a day (BID) | RESPIRATORY_TRACT | Status: DC

## 2023-12-27 NOTE — Patient Instructions (Addendum)
 Stop trelegy and lisinopril    Valsartan 160 mg take one half  daily and then increase to a whole pill if needed  Breztri up to 2 puffs every 12 hours  to take the place of trelegy   Work on inhaler technique:  relax and gently blow all the way out then take a nice smooth full deep breath back in, triggering the inhaler at same time you start breathing in.  Hold breath in for at least  5 seconds if you can. Blow out breztri thru nose. Rinse and gargle with water when done.  If mouth or throat bother you at all,  try brushing teeth/gums/tongue with arm and hammer toothpaste/ make a slurry and gargle and spit out.     Please remember to go to the  x-ray department  for your tests - we will call you with the results when they are available    Sign for pfts from Dr Chodri's office    Please schedule a follow up office visit in 4 weeks, sooner if needed

## 2023-12-28 ENCOUNTER — Other Ambulatory Visit: Payer: Self-pay

## 2023-12-28 DIAGNOSIS — I10 Essential (primary) hypertension: Secondary | ICD-10-CM | POA: Insufficient documentation

## 2023-12-28 DIAGNOSIS — J4489 Other specified chronic obstructive pulmonary disease: Secondary | ICD-10-CM | POA: Insufficient documentation

## 2023-12-28 MED ORDER — VALSARTAN 160 MG PO TABS: 160.0000 mg | ORAL_TABLET | Freq: Every day | ORAL | 2 refills | Status: DC

## 2023-12-28 NOTE — Assessment & Plan Note (Addendum)
 Dx 04/2013 squamous cell ca rx chemo s RT   I note her RML and RLL appear better aerated on cxr today and I hear good air movement s localized wheeze so I don't see an indication for bronchospy at this point but happy to arrange for it if/ when needed to guide oncology rx.

## 2023-12-28 NOTE — Assessment & Plan Note (Addendum)
 Changed ACEi to arb 12/27/2023 due to UACS   In the best review of chronic cough to date ( NEJM 2016 375 8455-8448) ,  ACEi are now felt to cause cough in up to  20% of pts which is a 4 fold increase from previous reports and does not include the variety of non-specific complaints we see in pulmonary clinic in pts on ACEi but previously attributed to another dx like  Copd/asthma and  include PNDS, throat and chest congestion, bronchitis, unexplained dyspnea and noct strangling/choking sensations, and hoarseness, but also  atypical /refractory GERD symptoms like dysphagia and bad heartburn   The only way I know  to prove this is not an ACEi Case is a trial off ACEi x a minimum of 6 weeks then regroup.   >>> try valsartan 160 mg one half daily and ok to use full tablet daily if finds bp not as well controlled as on ACEi         Each maintenance medication was reviewed in detail including emphasizing most importantly the difference between maintenance and prns and under what circumstances the prns are to be triggered using an action plan format where appropriate.  Total time for H and P, chart review, counseling, reviewing hfa  device(s) and generating customized AVS unique to this office visit / same day charting = 65 min new pt with multiple chronic  refractory respiratory  symptoms of uncertain etiology

## 2023-12-28 NOTE — Assessment & Plan Note (Addendum)
 Onset  ? 2015, well prior to covid, with neg w/u by pulmonary / cardiology in [place]  - 12/27/2023 try off acei and DPI   Note cc of  throat closing up at hs  is classic for Upper airway cough syndrome (previously labeled PNDS),  is so named because it's frequently impossible to sort out how much is  CR/sinusitis with freq throat clearing (which can be related to primary GERD)   vs  causing  secondary ( extra esophageal)  GERD from wide swings in gastric pressure that occur with throat clearing, often  promoting self use of mint and menthol  lozenges that reduce the lower esophageal sphincter tone and exacerbate the problem further in a cyclical fashion.  These are the same pts (now being labeled as having irritable larynx syndrome by some cough centers) who not infrequently have a history of having failed to tolerate ace inhibitors,  dry powder inhalers(lke trelegy) or biphosphonates or report having atypical/extraesophageal reflux symptoms from LPR (globus, throat clearing)  that don't respond to standard doses of PPI  and are easily confused as having aecopd or asthma flares by even experienced allergists/ pulmonologists (myself included).

## 2023-12-28 NOTE — Assessment & Plan Note (Addendum)
 Quit smoking 2022 with report from [facility] pulmonary  nothing wrong with lungs - requested last pfts from [facility]  12/27/2023 >>> - 12/27/2023  After extensive coaching inhaler device,  effectiveness =    75% (short ti)     >>>  D/c dpi due to UACS and started breztri samples/ rx if improving

## 2023-12-28 NOTE — Progress Notes (Unsigned)
 [facility] [address] [place],  [place]  [address] [phone]  Clinic Day: 12/22/2023  Referring physician: Sabas Norleen PARAS., MD   HISTORY OF PRESENT ILLNESS:  The patient is a 63 y.o. female with stage IVB (T2a N2a M1c1) squamous cell lung cancer, which includes spread of disease to her liver.  She comes in today to be re-evaluated before heading into her 3rd cycle of maintenance Abraxane .  This was preceded by 6 cycles of palliative carboplatin -based chemotherapy.  Of note, the patient had an allergic reaction to paclitaxel  after her fourth cycle of treatment.  Based upon this, she was switched to carboplatin /Abraxane  for her 5th and 6th cycles.  The patient claims to have excessive fatigue despite only being on 1 agent currently.  It is to the point where her daily quality of life remains a problem.  She also complains of chronic shortness of breath with just minimal exertion.  Of note, she is scheduled to see pulmonology next week.  Based upon how she feels today, she wishes to hold her third cycle of maintenance Abraxane  until after discussing her case with pulmonology next week.  PHYSICAL EXAM:  Last menstrual period 11/15/2011. Wt Readings from Last 3 Encounters:  12/27/23 271 lb (122.9 kg)  12/22/23 273 lb 8 oz (124.1 kg)  12/01/23 276 lb 8 oz (125.4 kg)   There is no height or weight on file to calculate BMI. Performance status (ECOG): 2 - Symptomatic, <50% confined to bed Physical Exam Constitutional:      Appearance: Normal appearance. She is not ill-appearing.     Comments: A pleasant woman in a wheelchair.  However, she gets extremely dyspneic with minor ambulation.    HENT:     Mouth/Throat:     Mouth: Mucous membranes are moist.     Pharynx: Oropharynx is clear. No oropharyngeal exudate or posterior oropharyngeal erythema.  Cardiovascular:     Rate and Rhythm: Normal rate and regular rhythm.     Heart sounds: No murmur heard.     No friction rub. No gallop.  Pulmonary:     Effort: Pulmonary effort is normal. No respiratory distress.     Breath sounds: No wheezing, rhonchi or rales.  Abdominal:     General: Bowel sounds are normal. There is no distension.     Palpations: Abdomen is soft. There is no mass.     Tenderness: There is no abdominal tenderness.  Musculoskeletal:        General: No swelling.     Right lower leg: No edema.     Left lower leg: No edema.  Lymphadenopathy:     Cervical: No cervical adenopathy.     Upper Body:     Right upper body: No supraclavicular or axillary adenopathy.     Left upper body: No supraclavicular or axillary adenopathy.     Lower Body: No right inguinal adenopathy. No left inguinal adenopathy.  Skin:    General: Skin is warm.     Coloration: Skin is not jaundiced.     Findings: No lesion or rash.  Neurological:     General: No focal deficit present.     Mental Status: She is alert and oriented to person, place, and time. Mental status is at baseline.  Psychiatric:        Mood and Affect: Mood normal.        Behavior: Behavior normal.        Thought Content: Thought content normal.  LABS:      Latest Ref Rng & Units 12/22/2023   10:24 AM 12/01/2023   10:52 AM 11/24/2023    9:07 AM  CBC  WBC 4.0 - 10.5 K/uL 4.8  6.1  5.8   Hemoglobin 12.0 - 15.0 g/dL 89.8  9.5  9.8   Hematocrit 36.0 - 46.0 % 30.7  29.6  30.3   Platelets 150 - 400 K/uL 249  249  232       Latest Ref Rng & Units 12/22/2023   10:24 AM 12/01/2023   10:52 AM 11/24/2023    9:07 AM  CMP  Glucose 70 - 99 mg/dL 869  883  859   BUN 8 - 23 mg/dL 8  6  5    Creatinine 0.44 - 1.00 mg/dL 9.28  9.35  9.42   Sodium 135 - 145 mmol/L 131  136  137   Potassium 3.5 - 5.1 mmol/L 4.1  4.3  3.4   Chloride 98 - 111 mmol/L 95  99  98   CO2 22 - 32 mmol/L 22  24  26    Calcium 8.9 - 10.3 mg/dL 89.8  9.6  9.8   Total Protein 6.5 - 8.1 g/dL 7.3  7.1  7.1   Total Bilirubin 0.0 - 1.2 mg/dL 0.3  0.4  0.3   Alkaline Phos  38 - 126 U/L 124  135  132   AST 15 - 41 U/L 20  18  17    ALT 0 - 44 U/L 12  <5  11     ASSESSMENT & PLAN:  Assessment/Plan:  A 63 y.o. female with stage IVB (T2a N2a M1c1) squamous cell lung cancer, which includes spread of disease to her liver.  Her third cycle of maintenance Abraxane  will be held today. As mentioned previously, she will see pulmonology next week.  I will see her back in 1 week to see if she is doing better before heading into her 3rd cycle of Abraxane .  I anticipate decreasing her Abraxane  by 20-25% to see if this will make each future cycle more tolerable.  The patient understands all the plans discussed today and is in agreement with them.    Loryn Haacke DELENA Kerns, MD

## 2023-12-29 ENCOUNTER — Inpatient Hospital Stay: Attending: Oncology

## 2023-12-29 ENCOUNTER — Telehealth: Payer: Self-pay | Admitting: Oncology

## 2023-12-29 ENCOUNTER — Encounter: Payer: Self-pay | Admitting: Oncology

## 2023-12-29 ENCOUNTER — Inpatient Hospital Stay (HOSPITAL_BASED_OUTPATIENT_CLINIC_OR_DEPARTMENT_OTHER): Admitting: Oncology

## 2023-12-29 ENCOUNTER — Other Ambulatory Visit: Payer: Self-pay | Admitting: Oncology

## 2023-12-29 ENCOUNTER — Other Ambulatory Visit (HOSPITAL_BASED_OUTPATIENT_CLINIC_OR_DEPARTMENT_OTHER): Payer: Self-pay

## 2023-12-29 ENCOUNTER — Inpatient Hospital Stay

## 2023-12-29 VITALS — BP 120/83 | HR 87 | Temp 98.6°F | Resp 14 | Ht 72.0 in | Wt 269.1 lb

## 2023-12-29 DIAGNOSIS — C787 Secondary malignant neoplasm of liver and intrahepatic bile duct: Secondary | ICD-10-CM | POA: Diagnosis not present

## 2023-12-29 DIAGNOSIS — C3401 Malignant neoplasm of right main bronchus: Secondary | ICD-10-CM

## 2023-12-29 DIAGNOSIS — C3490 Malignant neoplasm of unspecified part of unspecified bronchus or lung: Secondary | ICD-10-CM | POA: Insufficient documentation

## 2023-12-29 DIAGNOSIS — Z23 Encounter for immunization: Secondary | ICD-10-CM | POA: Insufficient documentation

## 2023-12-29 DIAGNOSIS — Z5111 Encounter for antineoplastic chemotherapy: Secondary | ICD-10-CM | POA: Diagnosis present

## 2023-12-29 LAB — CMP (CANCER CENTER ONLY)
ALT: 8 U/L (ref 0–44)
AST: 15 U/L (ref 15–41)
Albumin: 4 g/dL (ref 3.5–5.0)
Alkaline Phosphatase: 139 U/L — ABNORMAL HIGH (ref 38–126)
Anion gap: 13 (ref 5–15)
BUN: 8 mg/dL (ref 8–23)
CO2: 25 mmol/L (ref 22–32)
Calcium: 10.1 mg/dL (ref 8.9–10.3)
Chloride: 95 mmol/L — ABNORMAL LOW (ref 98–111)
Creatinine: 0.77 mg/dL (ref 0.44–1.00)
GFR, Estimated: >60 mL/min (ref >60)
Glucose, Bld: 125 mg/dL — ABNORMAL HIGH (ref 70–99)
Potassium: 4.2 mmol/L (ref 3.5–5.1)
Sodium: 133 mmol/L — ABNORMAL LOW (ref 135–145)
Total Bilirubin: 0.4 mg/dL (ref 0.0–1.2)
Total Protein: 7.5 g/dL (ref 6.5–8.1)

## 2023-12-29 LAB — CBC WITH DIFFERENTIAL (CANCER CENTER ONLY)
Abs Immature Granulocytes: 0.11 K/uL — ABNORMAL HIGH (ref 0.00–0.07)
Basophils Absolute: 0.1 K/uL (ref 0.0–0.1)
Basophils Relative: 1 %
Eosinophils Absolute: 0.1 K/uL (ref 0.0–0.5)
Eosinophils Relative: 1 %
HCT: 32.3 % — ABNORMAL LOW (ref 36.0–46.0)
Hemoglobin: 10.8 g/dL — ABNORMAL LOW (ref 12.0–15.0)
Immature Granulocytes: 1 %
Lymphocytes Relative: 11 %
Lymphs Abs: 1 K/uL (ref 0.7–4.0)
MCH: 30.4 pg (ref 26.0–34.0)
MCHC: 33.4 g/dL (ref 30.0–36.0)
MCV: 91 fL (ref 80.0–100.0)
Monocytes Absolute: 1.1 K/uL — ABNORMAL HIGH (ref 0.1–1.0)
Monocytes Relative: 12 %
Neutro Abs: 6.8 K/uL (ref 1.7–7.7)
Neutrophils Relative %: 74 %
Platelet Count: 286 K/uL (ref 150–400)
RBC: 3.55 MIL/uL — ABNORMAL LOW (ref 3.87–5.11)
RDW: 14.4 % (ref 11.5–15.5)
WBC Count: 9.2 K/uL (ref 4.0–10.5)
nRBC: 0 % (ref 0.0–0.2)

## 2023-12-29 LAB — MAGNESIUM: Magnesium: 1.7 mg/dL (ref 1.7–2.4)

## 2023-12-29 MED ORDER — DEXAMETHASONE SODIUM PHOSPHATE 10 MG/ML IJ SOLN
4.0000 mg | Freq: Once | INTRAMUSCULAR | Status: AC
  Administered 2023-12-29: 4 mg via INTRAVENOUS
  Filled 2023-12-29: qty 1

## 2023-12-29 MED ORDER — ONDANSETRON HCL 4 MG/2ML IJ SOLN
8.0000 mg | Freq: Once | INTRAMUSCULAR | Status: AC
  Administered 2023-12-29: 8 mg via INTRAVENOUS
  Filled 2023-12-29: qty 4

## 2023-12-29 MED ORDER — SODIUM CHLORIDE 0.9% FLUSH: 10.0000 mL | INTRAVENOUS | Status: DC | PRN

## 2023-12-29 MED ORDER — INFLUENZA VIRUS VACC SPLIT PF (FLUZONE) 0.5 ML IM SUSY
0.5000 mL | PREFILLED_SYRINGE | INTRAMUSCULAR | Status: AC
  Administered 2023-12-29: 0.5 mL via INTRAMUSCULAR
  Filled 2023-12-29: qty 0.5

## 2023-12-29 MED ORDER — PACLITAXEL PROTEIN-BOUND CHEMO INJECTION 100 MG
124.0000 mg/m2 | Freq: Once | INTRAVENOUS | Status: AC
  Administered 2023-12-29: 300 mg via INTRAVENOUS
  Filled 2023-12-29: qty 60

## 2023-12-29 MED ORDER — FLUCONAZOLE 100 MG PO TABS
ORAL_TABLET | ORAL | 1 refills | Status: DC
  Filled 2023-12-29: qty 6, 5d supply, fill #0
  Filled 2024-01-19: qty 6, 5d supply, fill #1

## 2023-12-29 MED ORDER — SODIUM CHLORIDE 0.9 % IV SOLN: INTRAVENOUS | Status: DC

## 2023-12-29 NOTE — Telephone Encounter (Signed)
 Patient has been scheduled for follow-up visit per 12/29/23 LOS.  Pt given an appt calendar with date and time.

## 2023-12-29 NOTE — Progress Notes (Signed)
 Decrease dose of nab-paclitaxel  by 20% due to poor tolerance per Dr. Ezzard.

## 2023-12-29 NOTE — Patient Instructions (Signed)
Paclitaxel Nanoparticle Albumin-Bound Injection What is this medication? NANOPARTICLE ALBUMIN-BOUND PACLITAXEL (Na no PAHR ti kuhl al BYOO muhn-bound PAK li TAX el) treats some types of cancer. It works by slowing down the growth of cancer cells. This medicine may be used for other purposes; ask your health care provider or pharmacist if you have questions. COMMON BRAND NAME(S): Abraxane What should I tell my care team before I take this medication? They need to know if you have any of these conditions: Liver disease Low white blood cell levels An unusual or allergic reaction to paclitaxel, albumin, other medications, foods, dyes, or preservatives If you or your partner are pregnant or trying to get pregnant Breast-feeding How should I use this medication? This medication is injected into a vein. It is given by your care team in a hospital or clinic setting. Talk to your care team about the use of this medication in children. Special care may be needed. Overdosage: If you think you have taken too much of this medicine contact a poison control center or emergency room at once. NOTE: This medicine is only for you. Do not share this medicine with others. What if I miss a dose? Keep appointments for follow-up doses. It is important not to miss your dose. Call your care team if you are unable to keep an appointment. What may interact with this medication? Other medications may affect the way this medication works. Talk with your care team about all of the medications you take. They may suggest changes to your treatment plan to lower the risk of side effects and to make sure your medications work as intended. This list may not describe all possible interactions. Give your health care provider a list of all the medicines, herbs, non-prescription drugs, or dietary supplements you use. Also tell them if you smoke, drink alcohol, or use illegal drugs. Some items may interact with your medicine. What  should I watch for while using this medication? Your condition will be monitored carefully while you are receiving this medication. You may need blood work while taking this medication. This medication may make you feel generally unwell. This is not uncommon as chemotherapy can affect healthy cells as well as cancer cells. Report any side effects. Continue your course of treatment even though you feel ill unless your care team tells you to stop. This medication can cause serious allergic reactions. To reduce the risk, your care team may give you other medications to take before receiving this one. Be sure to follow the directions from your care team. This medication may increase your risk of getting an infection. Call your care team for advice if you get a fever, chills, sore throat, or other symptoms of a cold or flu. Do not treat yourself. Try to avoid being around people who are sick. This medication may increase your risk to bruise or bleed. Call your care team if you notice any unusual bleeding. Be careful brushing or flossing your teeth or using a toothpick because you may get an infection or bleed more easily. If you have any dental work done, tell your dentist you are receiving this medication. Talk to your care team if you or your partner may be pregnant. Serious birth defects can occur if you take this medication during pregnancy and for 6 months after the last dose. You will need a negative pregnancy test before starting this medication. Contraception is recommended while taking this medication and for 6 months after the last dose. Your care team   can help you find the option that works for you. If your partner can get pregnant, use a condom during sex while taking this medication and for 3 months after the last dose. Do not breastfeed while taking this medication and for 2 weeks after the last dose. This medication may cause infertility. Talk to your care team if you are concerned about your  fertility. What side effects may I notice from receiving this medication? Side effects that you should report to your care team as soon as possible: Allergic reactions--skin rash, itching, hives, swelling of the face, lips, tongue, or throat Dry cough, shortness of breath or trouble breathing Infection--fever, chills, cough, sore throat, wounds that don't heal, pain or trouble when passing urine, general feeling of discomfort or being unwell Low red blood cell level--unusual weakness or fatigue, dizziness, headache, trouble breathing Pain, tingling, or numbness in the hands or feet Stomach pain, unusual weakness or fatigue, nausea, vomiting, diarrhea, or fever that lasts longer than expected Unusual bruising or bleeding Side effects that usually do not require medical attention (report to your care team if they continue or are bothersome): Diarrhea Fatigue Hair loss Loss of appetite Nausea Vomiting This list may not describe all possible side effects. Call your doctor for medical advice about side effects. You may report side effects to FDA at 1-800-FDA-1088. Where should I keep my medication? This medication is given in a hospital or clinic. It will not be stored at home. NOTE: This sheet is a summary. It may not cover all possible information. If you have questions about this medicine, talk to your doctor, pharmacist, or health care provider.  2024 Elsevier/Gold Standard (2021-07-30 00:00:00)  

## 2024-01-02 ENCOUNTER — Ambulatory Visit: Payer: Self-pay | Admitting: Internal Medicine

## 2024-01-03 ENCOUNTER — Ambulatory Visit: Payer: Self-pay

## 2024-01-03 NOTE — Progress Notes (Signed)
 Atc x1 lmtcb

## 2024-01-03 NOTE — Telephone Encounter (Signed)
 See phone note

## 2024-01-03 NOTE — Progress Notes (Signed)
 Spoke with pt regarding results, pt confirmed understanding

## 2024-01-03 NOTE — Telephone Encounter (Signed)
 FYI Only or Action Required?: FYI only for provider.  Patient is followed in Pulmonology for lung ca, last seen on 12/27/2023 by Darlean Ozell NOVAK, MD.  Called Nurse Triage reporting Results.  Symptoms began N/A.  Interventions attempted: Other: N/A.  Symptoms are: N/A.  Triage Disposition: Information or Advice Only Call  Patient/caregiver understands and will follow disposition?: Yes  Copied from CRM [id number]. Topic: Clinical - Lab/Test Results >> Jan 03, 2024  9:36 AM Nathanel DEL wrote: Reason for CRM: pt calling for results of chest xray.  Pt was called 1 time Reason for Disposition  Health information question, no triage required and triager able to answer question  Answer Assessment - Initial Assessment Questions Pt had chest xray and was calling for results. Results read and commented on by Dr. Darlean, Comments relayed to pt. No further questions at this time, NAD.   1. REASON FOR CALL: What is the main reason for your call? or How can I best help you?     Would like to know results of recent Chest Xray 2. SYMPTOMS : Do you have any symptoms?      denies 3. OTHER QUESTIONS: Do you have any other questions?     denies  Protocols used: Information Only Call - No Triage-A-AH

## 2024-01-12 ENCOUNTER — Other Ambulatory Visit

## 2024-01-12 ENCOUNTER — Ambulatory Visit

## 2024-01-12 ENCOUNTER — Ambulatory Visit: Admitting: Oncology

## 2024-01-18 NOTE — Progress Notes (Unsigned)
 [facility] at [facility] [address] [place],  [place]  [address] [phone]  Clinic Day: 12/29/2023  Referring physician: Sabas Norleen PARAS., MD   HISTORY OF PRESENT ILLNESS:  The patient is a 63 y.o. female with stage IVB (T2a N2a M1c1) squamous cell lung cancer, which includes spread of disease to her liver.  She comes in today to be re-evaluated before heading into her 4th cycle of maintenance Abraxane .  This was preceded by 6 cycles of palliative carboplatin -based chemotherapy.  Of note, the patient had an allergic reaction to paclitaxel  after her fourth cycle of treatment.  Based upon this, she was switched to carboplatin /Abraxane  for her 5th and 6th cycles.  A 20% dose reduction was done withher third cycle of maintenance Abraxane , for which the patient claims helped immensely with her being able to tolerate this agent.  The patient claims that her coughing and breathing issues have also significantly improved since she was switched from an ACE inhibitor to an ARB.  She does have occasional back pain and left arm discomfort, but they are manageable at this time.  PHYSICAL EXAM:  Blood pressure 125/86, pulse 90, temperature 98.7 F (37.1 C), temperature source Oral, resp. rate 16, height 6' (1.829 m), weight 273 lb 6.4 oz (124 kg), last menstrual period 11/15/2011, SpO2 96%. Wt Readings from Last 3 Encounters:  01/19/24 273 lb 6.4 oz (124 kg)  12/29/23 269 lb 1.6 oz (122.1 kg)  12/27/23 271 lb (122.9 kg)   Body mass index is 37.08 kg/m. Performance status (ECOG): 2 - Symptomatic, <50% confined to bed Physical Exam Constitutional:      Appearance: Normal appearance. She is not ill-appearing.     Comments: A pleasant woman in a wheelchair.    HENT:     Mouth/Throat:     Mouth: Mucous membranes are moist.     Pharynx: Oropharynx is clear. No oropharyngeal exudate or posterior oropharyngeal erythema.  Cardiovascular:     Rate and Rhythm: Normal rate and  regular rhythm.     Heart sounds: No murmur heard.    No friction rub. No gallop.  Pulmonary:     Effort: Pulmonary effort is normal. No respiratory distress.     Breath sounds: No wheezing, rhonchi or rales.  Abdominal:     General: Bowel sounds are normal. There is no distension.     Palpations: Abdomen is soft. There is no mass.     Tenderness: There is no abdominal tenderness.  Musculoskeletal:        General: No swelling.     Right lower leg: No edema.     Left lower leg: No edema.  Lymphadenopathy:     Cervical: No cervical adenopathy.     Upper Body:     Right upper body: No supraclavicular or axillary adenopathy.     Left upper body: No supraclavicular or axillary adenopathy.     Lower Body: No right inguinal adenopathy. No left inguinal adenopathy.  Skin:    General: Skin is warm.     Coloration: Skin is not jaundiced.     Findings: No lesion or rash.  Neurological:     General: No focal deficit present.     Mental Status: She is alert and oriented to person, place, and time. Mental status is at baseline.  Psychiatric:        Mood and Affect: Mood normal.        Behavior: Behavior normal.  Thought Content: Thought content normal.    LABS:      Latest Ref Rng & Units 01/19/2024   11:20 AM 12/29/2023   11:09 AM 12/22/2023   10:24 AM  CBC  WBC 4.0 - 10.5 K/uL 6.3  9.2  4.8   Hemoglobin 12.0 - 15.0 g/dL 88.6  89.1  89.8   Hematocrit 36.0 - 46.0 % 33.8  32.3  30.7   Platelets 150 - 400 K/uL 199  286  249       Latest Ref Rng & Units 01/19/2024   11:20 AM 12/29/2023   11:09 AM 12/22/2023   10:24 AM  CMP  Glucose 70 - 99 mg/dL 863  874  869   BUN 8 - 23 mg/dL 5  8  8    Creatinine 0.44 - 1.00 mg/dL 9.26  9.22  9.28   Sodium 135 - 145 mmol/L 133  133  131   Potassium 3.5 - 5.1 mmol/L 4.6  4.2  4.1   Chloride 98 - 111 mmol/L 97  95  95   CO2 22 - 32 mmol/L 24  25  22    Calcium 8.9 - 10.3 mg/dL 89.1  89.8  89.8   Total Protein 6.5 - 8.1 g/dL 7.6  7.5  7.3    Total Bilirubin 0.0 - 1.2 mg/dL 0.3  0.4  0.3   Alkaline Phos 38 - 126 U/L 126  139  124   AST 15 - 41 U/L 23  15  20    ALT 0 - 44 U/L 15  8  12      ASSESSMENT & PLAN:  Assessment/Plan:  A 63 y.o. female with stage IVB (T2a N2a M1c1) squamous cell lung cancer, which includes spread of disease to her liver.  She will proceed with her fourth cycle of maintenance Abraxane  today.  As mentioned previously, she has tolerated her Abraxane  much better since it was decreased by 20%.  Overall, she appears to be doing well.  I will see her back in 3 weeks before she heads into her 5th cycle of maintenance Abraxane .  The patient understands all the plans discussed today and is in agreement with them.    Angellynn Kimberlin DELENA Kerns, MD

## 2024-01-19 ENCOUNTER — Encounter: Payer: Self-pay | Admitting: Oncology

## 2024-01-19 ENCOUNTER — Other Ambulatory Visit (HOSPITAL_BASED_OUTPATIENT_CLINIC_OR_DEPARTMENT_OTHER): Payer: Self-pay

## 2024-01-19 ENCOUNTER — Telehealth: Payer: Self-pay | Admitting: Oncology

## 2024-01-19 ENCOUNTER — Inpatient Hospital Stay (HOSPITAL_BASED_OUTPATIENT_CLINIC_OR_DEPARTMENT_OTHER): Admitting: Oncology

## 2024-01-19 ENCOUNTER — Inpatient Hospital Stay

## 2024-01-19 VITALS — BP 125/86 | HR 90 | Temp 98.7°F | Resp 16 | Ht 72.0 in | Wt 273.4 lb

## 2024-01-19 DIAGNOSIS — C3401 Malignant neoplasm of right main bronchus: Secondary | ICD-10-CM

## 2024-01-19 DIAGNOSIS — Z5111 Encounter for antineoplastic chemotherapy: Secondary | ICD-10-CM | POA: Diagnosis not present

## 2024-01-19 DIAGNOSIS — R110 Nausea: Secondary | ICD-10-CM

## 2024-01-19 LAB — CBC WITH DIFFERENTIAL (CANCER CENTER ONLY)
Abs Immature Granulocytes: 0.11 K/uL — ABNORMAL HIGH (ref 0.00–0.07)
Basophils Absolute: 0 K/uL (ref 0.0–0.1)
Basophils Relative: 0 %
Eosinophils Absolute: 0.1 K/uL (ref 0.0–0.5)
Eosinophils Relative: 1 %
HCT: 33.8 % — ABNORMAL LOW (ref 36.0–46.0)
Hemoglobin: 11.3 g/dL — ABNORMAL LOW (ref 12.0–15.0)
Immature Granulocytes: 2 %
Lymphocytes Relative: 13 %
Lymphs Abs: 0.8 K/uL (ref 0.7–4.0)
MCH: 29.9 pg (ref 26.0–34.0)
MCHC: 33.4 g/dL (ref 30.0–36.0)
MCV: 89.4 fL (ref 80.0–100.0)
Monocytes Absolute: 0.5 K/uL (ref 0.1–1.0)
Monocytes Relative: 9 %
Neutro Abs: 4.7 K/uL (ref 1.7–7.7)
Neutrophils Relative %: 75 %
Platelet Count: 199 K/uL (ref 150–400)
RBC: 3.78 MIL/uL — ABNORMAL LOW (ref 3.87–5.11)
RDW: 14.7 % (ref 11.5–15.5)
WBC Count: 6.3 K/uL (ref 4.0–10.5)
nRBC: 0 % (ref 0.0–0.2)

## 2024-01-19 LAB — CMP (CANCER CENTER ONLY)
ALT: 15 U/L (ref 0–44)
AST: 23 U/L (ref 15–41)
Albumin: 4.3 g/dL (ref 3.5–5.0)
Alkaline Phosphatase: 126 U/L (ref 38–126)
Anion gap: 12 (ref 5–15)
BUN: <5 mg/dL — ABNORMAL LOW (ref 8–23)
CO2: 24 mmol/L (ref 22–32)
Calcium: 10.8 mg/dL — ABNORMAL HIGH (ref 8.9–10.3)
Chloride: 97 mmol/L — ABNORMAL LOW (ref 98–111)
Creatinine: 0.73 mg/dL (ref 0.44–1.00)
GFR, Estimated: >60 mL/min (ref >60)
Glucose, Bld: 136 mg/dL — ABNORMAL HIGH (ref 70–99)
Potassium: 4.6 mmol/L (ref 3.5–5.1)
Sodium: 133 mmol/L — ABNORMAL LOW (ref 135–145)
Total Bilirubin: 0.3 mg/dL (ref 0.0–1.2)
Total Protein: 7.6 g/dL (ref 6.5–8.1)

## 2024-01-19 LAB — MAGNESIUM: Magnesium: 1.3 mg/dL — ABNORMAL LOW (ref 1.7–2.4)

## 2024-01-19 MED ORDER — ALTEPLASE 2 MG IJ SOLR
2.0000 mg | Freq: Once | INTRAMUSCULAR | Status: AC | PRN
  Administered 2024-01-19: 2 mg
  Filled 2024-01-19: qty 2

## 2024-01-19 MED ORDER — PACLITAXEL PROTEIN-BOUND CHEMO INJECTION 100 MG
124.8000 mg/m2 | Freq: Once | INTRAVENOUS | Status: AC
  Administered 2024-01-19: 300 mg via INTRAVENOUS
  Filled 2024-01-19: qty 60

## 2024-01-19 MED ORDER — DEXAMETHASONE SOD PHOSPHATE PF 10 MG/ML IJ SOLN
4.0000 mg | Freq: Once | INTRAMUSCULAR | Status: AC
  Administered 2024-01-19: 4 mg via INTRAVENOUS

## 2024-01-19 MED ORDER — MAGNESIUM SULFATE 4 GM/100ML IV SOLN
4.0000 g | Freq: Once | INTRAVENOUS | Status: AC
  Administered 2024-01-19: 4 g via INTRAVENOUS
  Filled 2024-01-19: qty 100

## 2024-01-19 MED ORDER — ONDANSETRON HCL 4 MG/2ML IJ SOLN
8.0000 mg | Freq: Once | INTRAMUSCULAR | Status: AC
  Administered 2024-01-19: 8 mg via INTRAVENOUS
  Filled 2024-01-19: qty 4

## 2024-01-19 MED ORDER — SODIUM CHLORIDE 0.9 % IV SOLN: INTRAVENOUS | Status: DC

## 2024-01-19 NOTE — Telephone Encounter (Signed)
 Patient has been scheduled for follow-up visit per 01/19/24 LOS.  Pt given an appt calendar with date and time.

## 2024-01-19 NOTE — Patient Instructions (Signed)
Paclitaxel Nanoparticle Albumin-Bound Injection What is this medication? NANOPARTICLE ALBUMIN-BOUND PACLITAXEL (Na no PAHR ti kuhl al BYOO muhn-bound PAK li TAX el) treats some types of cancer. It works by slowing down the growth of cancer cells. This medicine may be used for other purposes; ask your health care provider or pharmacist if you have questions. COMMON BRAND NAME(S): Abraxane What should I tell my care team before I take this medication? They need to know if you have any of these conditions: Liver disease Low white blood cell levels An unusual or allergic reaction to paclitaxel, albumin, other medications, foods, dyes, or preservatives If you or your partner are pregnant or trying to get pregnant Breast-feeding How should I use this medication? This medication is injected into a vein. It is given by your care team in a hospital or clinic setting. Talk to your care team about the use of this medication in children. Special care may be needed. Overdosage: If you think you have taken too much of this medicine contact a poison control center or emergency room at once. NOTE: This medicine is only for you. Do not share this medicine with others. What if I miss a dose? Keep appointments for follow-up doses. It is important not to miss your dose. Call your care team if you are unable to keep an appointment. What may interact with this medication? Other medications may affect the way this medication works. Talk with your care team about all of the medications you take. They may suggest changes to your treatment plan to lower the risk of side effects and to make sure your medications work as intended. This list may not describe all possible interactions. Give your health care provider a list of all the medicines, herbs, non-prescription drugs, or dietary supplements you use. Also tell them if you smoke, drink alcohol, or use illegal drugs. Some items may interact with your medicine. What  should I watch for while using this medication? Your condition will be monitored carefully while you are receiving this medication. You may need blood work while taking this medication. This medication may make you feel generally unwell. This is not uncommon as chemotherapy can affect healthy cells as well as cancer cells. Report any side effects. Continue your course of treatment even though you feel ill unless your care team tells you to stop. This medication can cause serious allergic reactions. To reduce the risk, your care team may give you other medications to take before receiving this one. Be sure to follow the directions from your care team. This medication may increase your risk of getting an infection. Call your care team for advice if you get a fever, chills, sore throat, or other symptoms of a cold or flu. Do not treat yourself. Try to avoid being around people who are sick. This medication may increase your risk to bruise or bleed. Call your care team if you notice any unusual bleeding. Be careful brushing or flossing your teeth or using a toothpick because you may get an infection or bleed more easily. If you have any dental work done, tell your dentist you are receiving this medication. Talk to your care team if you or your partner may be pregnant. Serious birth defects can occur if you take this medication during pregnancy and for 6 months after the last dose. You will need a negative pregnancy test before starting this medication. Contraception is recommended while taking this medication and for 6 months after the last dose. Your care team   can help you find the option that works for you. If your partner can get pregnant, use a condom during sex while taking this medication and for 3 months after the last dose. Do not breastfeed while taking this medication and for 2 weeks after the last dose. This medication may cause infertility. Talk to your care team if you are concerned about your  fertility. What side effects may I notice from receiving this medication? Side effects that you should report to your care team as soon as possible: Allergic reactions--skin rash, itching, hives, swelling of the face, lips, tongue, or throat Dry cough, shortness of breath or trouble breathing Infection--fever, chills, cough, sore throat, wounds that don't heal, pain or trouble when passing urine, general feeling of discomfort or being unwell Low red blood cell level--unusual weakness or fatigue, dizziness, headache, trouble breathing Pain, tingling, or numbness in the hands or feet Stomach pain, unusual weakness or fatigue, nausea, vomiting, diarrhea, or fever that lasts longer than expected Unusual bruising or bleeding Side effects that usually do not require medical attention (report to your care team if they continue or are bothersome): Diarrhea Fatigue Hair loss Loss of appetite Nausea Vomiting This list may not describe all possible side effects. Call your doctor for medical advice about side effects. You may report side effects to FDA at 1-800-FDA-1088. Where should I keep my medication? This medication is given in a hospital or clinic. It will not be stored at home. NOTE: This sheet is a summary. It may not cover all possible information. If you have questions about this medicine, talk to your doctor, pharmacist, or health care provider.  2024 Elsevier/Gold Standard (2021-07-30 00:00:00)  

## 2024-01-20 ENCOUNTER — Other Ambulatory Visit: Payer: Self-pay

## 2024-01-24 ENCOUNTER — Other Ambulatory Visit: Payer: Self-pay

## 2024-01-27 ENCOUNTER — Encounter: Payer: Self-pay | Admitting: Internal Medicine

## 2024-01-27 ENCOUNTER — Ambulatory Visit: Admitting: Internal Medicine

## 2024-01-27 VITALS — BP 106/73 | HR 88 | Temp 97.7°F | Ht 73.0 in | Wt 273.0 lb

## 2024-01-27 DIAGNOSIS — J4489 Other specified chronic obstructive pulmonary disease: Secondary | ICD-10-CM | POA: Diagnosis not present

## 2024-01-27 DIAGNOSIS — R058 Other specified cough: Secondary | ICD-10-CM

## 2024-01-27 DIAGNOSIS — I10 Essential (primary) hypertension: Secondary | ICD-10-CM

## 2024-01-27 MED ORDER — VALSARTAN 80 MG PO TABS
80.0000 mg | ORAL_TABLET | Freq: Every day | ORAL | 1 refills | Status: AC
Start: 2024-01-27 — End: ?

## 2024-01-27 NOTE — Progress Notes (Signed)
 Susan Franco, female    DOB: 10/17/1960   MRN: [id number]   Brief patient profile:  63 yowf quit smoking 2022  referred to pulmonary clinic 12/27/2023 by [name]  for sob since before covid on trelelgy/ACEi    Baseline wt 140 at age 63  p last child   Prev w/u for unexplained doe neg in [place] by cards/ pulmonary ? 2022?  - does not remember ever being tried off ACEi  Bx liver 05/10/23  PDNSC ca    History of Present Illness  12/27/2023  Pulmonary/ 1st office eval/Susan Franco on lisinopril  /trelegy  Pt not previously seen by PCCM service.   Chief Complaint  Patient presents with   Consult    Referred by Dr. [name]. Pt c/o DOE for the past 10 years. She gets winded walking room to room.   Dyspnea:  room to room at home - uses w/c to go out  Cough: less since stopped smoking  Sleep: flat bed / 3 pillows  feels like throat closing up freq at HS and sits p in recliner for sev hours to get relief   SABA use: none 02 [id number]  Patient Instructions  Stop trelegy and lisinopril   Valsartan 160 mg take one half  daily and then increase to a whole pill if needed Breztri up to 2 puffs every 12 hours  to take the place of trelegy  Work on inhaler technique: Sign for pfts from Dr [employer] office  Please schedule a follow up office visit in 4 weeks, sooner if needed    01/27/2024  f/u ov/Susan Franco re: doe/ cough    maint on prn  breztri with marked improvement in resp cc's    Chief Complaint  Patient presents with   Cough    Patient states her breathing and cough has improved.   Shortness of Breath   Dyspnea:  still room to room at home / also limited by back and breathing about the same time no worse off trelegy  Cough: gone  Sleeping: flat bed 3 pillows wakes up to use Bathroom then stays in recliner due to nasal congestion x fall 2025  SABA use: breztri prn doesn't really think she needs it. Has not filled rx/ no pfts available yet  02: none    No obvious day to day or daytime variability  or assoc excess/ purulent sputum or mucus plugs or hemoptysis or cp or chest tightness, subjective wheeze or overt   hb symptoms.    Also denies any obvious fluctuation of symptoms with weather or environmental changes or other aggravating or alleviating factors except as outlined above   No unusual exposure hx or h/o childhood pna/ asthma or knowledge of premature birth.  Current Allergies, Complete Past Medical History, Past Surgical History, Family History, and Social History were reviewed in [employer] record.  ROS  The following are not active complaints unless bolded Hoarseness, sore throat, dysphagia, dental problems, itching, sneezing,  nasal congestion or discharge of excess mucus or purulent secretions, ear ache,   fever, chills, sweats, unintended wt loss or wt gain, classically pleuritic or exertional cp,  orthopnea pnd or arm/hand swelling  or leg swelling, presyncope, palpitations, abdominal pain, anorexia, nausea, vomiting, diarrhea  or change in bowel habits or change in bladder habits, change in stools or change in urine, dysuria, hematuria,  rash, arthralgias, visual complaints, headache, numbness, weakness or ataxia or problems with walking or coordination,  change in mood or  memory.  Current Meds  Medication Sig   ALPRAZolam  (XANAX ) 0.25 MG tablet Take 0.5 mg by mouth 2 (two) times daily as needed for anxiety.   amLODipine  (NORVASC ) 5 MG tablet Take 5 mg by mouth daily.   atenolol  (TENORMIN ) 50 MG tablet Take 50 mg by mouth daily.   budesonide-glycopyrrolate -formoterol (BREZTRI AEROSPHERE) 160-9-4.8 MCG/ACT AERO inhaler Inhale 2 puffs into the lungs 2 (two) times daily. Take 2 puffs first thing in am and then another 2 puffs about 12 hours later.   dexamethasone  (DECADRON ) 4 MG tablet Take 2 tablets (8mg ) by mouth daily starting the day after carboplatin  for 3 days. Take with food   HYDROcodone  bit-homatropine (HYCODAN) 5-1.5 MG/5ML syrup Take 5  mLs by mouth every 6 (six) hours as needed for cough.   lidocaine -alum & mag hydroxide-simeth-nystatin -diphenhydrAMINE -prednisoLONE  Swish and swallow or spit out up to 4 (four) times daily if needed for thrush   lidocaine -prilocaine  (EMLA ) cream Apply to affected area once   loperamide (IMODIUM A-D) 2 MG tablet Take 2 mg by mouth 4 (four) times daily as needed for diarrhea or loose stools.   magnesium  oxide (MAG-OX) 400 (240 Mg) MG tablet Take 1 tablet (400 mg total) by mouth 2 (two) times daily.   OLANZapine  (ZYPREXA ) 5 MG tablet Take 1 tablet (5 mg total) by mouth at bedtime. For anxiety, nausea, and appetite related to cancer & chemotherapy   omeprazole (PRILOSEC) 40 MG capsule Take 40 mg by mouth daily.   ondansetron  (ZOFRAN ) 8 MG tablet Take 1 tablet (8 mg total) by mouth every 8 (eight) hours as needed for nausea or vomiting. Start on the third day after carboplatin .   prochlorperazine  (COMPAZINE ) 10 MG tablet Take 1 tablet (10 mg total) by mouth every 6 (six) hours as needed for nausea or vomiting.   sennosides-docusate sodium  (SENOKOT-S) 8.6-50 MG tablet Take 1 tablet by mouth in the morning and at bedtime.   spironolactone  (ALDACTONE ) 25 MG tablet Take 25 mg by mouth daily.   valsartan (DIOVAN) 160 MG tablet Take 1 tablet (160 mg total) by mouth daily.   venlafaxine  XR (EFFEXOR -XR) 150 MG 24 hr capsule Take 150 mg by mouth daily.   zolpidem  (AMBIEN ) 5 MG tablet Take 1 tablet (5 mg total) by mouth at bedtime as needed for sleep.          Past Medical History:  Diagnosis Date   Anemia    Anxiety    Depression    GERD (gastroesophageal reflux disease)    History of EKG 08/2011   was reflux-done at [facility] hospital-will request   Hypertension    atenolol  and norvasc    Lung cancer (HCC)    Psoriasis    Psoriatic arthritis (HCC) 2020   [facility]   Rheumatoid arthritis (HCC) 2017   Seasonal allergies    Smoker    SOB (shortness of breath)       Objective:    Wt Readings from  Last 3 Encounters:  01/19/24 273 lb 6.4 oz (124 kg)  12/29/23 269 lb 1.6 oz (122.1 kg)  12/27/23 271 lb (122.9 kg)      Vital signs reviewed  01/27/2024  - Note at rest 02 sats  95% on RA   General appearance:    pleasant w/c bound mod obese (by BMI) wf all smiles     HEENT : Oropharynx  clear      Nasal turbinates mod non-specific edema   NECK :  without  apparent JVD/ palpable Nodes/TM  LUNGS: no acc muscle use,  Nl contour chest which is clear to A and P bilaterally without cough on insp or exp maneuvers   CV:  RRR  no s3 or murmur or increase in P2, and no edema   ABD:  obese soft and nontender   MS:  ext warm without deformities Or obvious joint restrictions  calf tenderness, cyanosis or clubbing    SKIN: warm and dry without lesions    NEURO:  alert, approp, nl sensorium with  no motor or cerebellar deficits apparent.        CXR PA and Lateral:   12/27/2023 :    I personally reviewed images and impression is as follows:  R hilar densities not appreciated as much on the lateral view but overall aeration of both RML and RLL appear improved       Assessment     Assessment & Plan Upper airway cough syndrome Onset  ? 2015, well prior to covid, with neg w/u by pulmonary / cardiology in [place]  - 12/27/2023 try off acei and DPI > resolved to her satisfaction on f/u ov 01/27/2024  - assoc Chronic rhinitis new onset fall 2025 better when on Dex for chemo so rec   >>>  afrin/ flonase  01/27/2024 > see AVS   >>> Kozlow eval in [place] prn    Asthmatic bronchitis , chronic (HCC) Quit smoking 2022 with report from [place] pulmonary  nothing wrong with lungs - requested last pfts from [facility]  12/27/2023 >>>    >>>  D/c dpi due to UACS and started breztri samples/ rx if improving > not using as of   01/27/2024   >>> rec breztri prn and return with pfts from [facility] 3 months   Essential hypertension Changed ACEi to arb 12/27/2023 due to UACS > valsartan 160 one half daily    Although even in retrospect it may not be clear the ACEi contributed to the pt's symptoms,  Pt improved off them and adding them back at this point or in the future would risk confusion in interpretation of non-specific respiratory symptoms to which this patient is prone  ie  Better not to muddy the waters here.    >>> change dose to valsartan  80 mg one daily and f/u pcp         Each maintenance medication was reviewed in detail including emphasizing most importantly the difference between maintenance and prns and under what circumstances the prns are to be triggered using an action plan format where appropriate.  Total time for H and P, chart review, counseling, reviewing hfa/nasal device(s) and generating customized AVS unique to this office visit / same day charting = 34 min         AVS  Patient Instructions  I emphasized that nasal steroids have no immediate benefit in terms of improving symptoms.  To help them reached the target tissue, the patient should use Afrin two puffs every 12 hours applied one min before using the nasal steroids.  Afrin should be stopped after no more than 5 days.  If the symptoms worsen, Afrin can be restarted after 5 days off of therapy to prevent rebound congestion from overuse of Afrin.  I also emphasized that in no way are nasal steroids a concern in terms of addiction.   Continue valsartan 80 mg one daily and let your PCP refill at at your next routine    Please schedule a follow up visit in 3 months but call  sooner if needed  - bring  a copy of your PFTs with you      Ozell America, MD 01/27/2024

## 2024-01-27 NOTE — Assessment & Plan Note (Addendum)
 Quit smoking 2022 with report from [place] pulmonary  nothing wrong with lungs - requested last pfts from [facility]  12/27/2023 >>>    >>>  D/c dpi due to UACS and started breztri samples/ rx if improving > not using as of   01/27/2024   >>> rec breztri prn and return with pfts from [facility] 3 months

## 2024-01-27 NOTE — Patient Instructions (Addendum)
 I emphasized that nasal steroids have no immediate benefit in terms of improving symptoms.  To help them reached the target tissue, the patient should use Afrin two puffs every 12 hours applied one min before using the nasal steroids.  Afrin should be stopped after no more than 5 days.  If the symptoms worsen, Afrin can be restarted after 5 days off of therapy to prevent rebound congestion from overuse of Afrin.  I also emphasized that in no way are nasal steroids a concern in terms of addiction.   Continue valsartan 80 mg one daily and let your PCP refill at at your next routine    Please schedule a follow up visit in 3 months but call sooner if needed  - bring  a copy of your PFTs with you

## 2024-01-27 NOTE — Assessment & Plan Note (Addendum)
 Onset  ? 2015, well prior to covid, with neg w/u by pulmonary / cardiology in [place]  - 12/27/2023 try off acei and DPI > resolved to her satisfaction on f/u ov 01/27/2024  - assoc Chronic rhinitis new onset fall 2025 better when on Dex for chemo so rec   >>>  afrin/ flonase  01/27/2024 > see AVS   >>> Kozlow eval in [place] prn

## 2024-01-27 NOTE — Assessment & Plan Note (Addendum)
 Changed ACEi to arb 12/27/2023 due to UACS > valsartan 160 one half daily   Although even in retrospect it may not be clear the ACEi contributed to the pt's symptoms,  Pt improved off them and adding them back at this point or in the future would risk confusion in interpretation of non-specific respiratory symptoms to which this patient is prone  ie  Better not to muddy the waters here.    >>> change dose to valsartan  80 mg one daily and f/u pcp         Each maintenance medication was reviewed in detail including emphasizing most importantly the difference between maintenance and prns and under what circumstances the prns are to be triggered using an action plan format where appropriate.  Total time for H and P, chart review, counseling, reviewing hfa/nasal device(s) and generating customized AVS unique to this office visit / same day charting = 34 min

## 2024-01-28 ENCOUNTER — Other Ambulatory Visit: Payer: Self-pay

## 2024-02-08 ENCOUNTER — Other Ambulatory Visit: Payer: Self-pay | Admitting: Oncology

## 2024-02-08 DIAGNOSIS — C3401 Malignant neoplasm of right main bronchus: Secondary | ICD-10-CM

## 2024-02-08 NOTE — Progress Notes (Signed)
 [facility] at [facility] [address] [place],  [place]  [address] [phone]  Clinic Day: 02/09/2024  Referring physician: Sabas Norleen PARAS., MD   HISTORY OF PRESENT ILLNESS:  The patient is a 63 y.o. female with stage IVB (T2a N2a M1c1) squamous cell lung cancer, which includes spread of disease to her liver.  She comes in today to be evaluated before heading into her 5th cycle of maintenance Abraxane .  The patient claims to have tolerated her fourth cycle of Abraxane  much better.  She has definitely noticed an improvement in her daily quality of life since a dose reduction was performed.  She denies having any new respiratory symptoms which concern her for progression of her lung cancer.    With respect to her lung cancer history, her maintenance Abraxane  was preceded by 6 cycles of palliative carboplatin -based chemotherapy.  Of note, the patient had an allergic reaction to paclitaxel  after her fourth cycle of treatment.  Based upon this, she was switched to carboplatin /Abraxane  for her 5th and 6th cycles.  A 20% dose reduction was done with her third cycle of maintenance Abraxane , for which the patient claims helped immensely with her being able to tolerate this agent.    PHYSICAL EXAM:  Blood pressure 126/83, pulse 88, temperature 98 F (36.7 C), temperature source Oral, resp. rate 18, height 6' 1 (1.854 m), weight 265 lb 4.8 oz (120.3 kg), last menstrual period 11/15/2011, SpO2 97%. Wt Readings from Last 3 Encounters:  02/09/24 265 lb 4.8 oz (120.3 kg)  01/27/24 273 lb (123.8 kg)  01/19/24 273 lb 6.4 oz (124 kg)   Body mass index is 35 kg/m. Performance status (ECOG): 2 - Symptomatic, <50% confined to bed Physical Exam Constitutional:      Appearance: Normal appearance. She is not ill-appearing.     Comments: A pleasant woman in a wheelchair.    HENT:     Mouth/Throat:     Mouth: Mucous membranes are moist.     Pharynx: Oropharynx is clear. No  oropharyngeal exudate or posterior oropharyngeal erythema.  Cardiovascular:     Rate and Rhythm: Normal rate and regular rhythm.     Heart sounds: No murmur heard.    No friction rub. No gallop.  Pulmonary:     Effort: Pulmonary effort is normal. No respiratory distress.     Breath sounds: No wheezing, rhonchi or rales.  Abdominal:     General: Bowel sounds are normal. There is no distension.     Palpations: Abdomen is soft. There is no mass.     Tenderness: There is no abdominal tenderness.  Musculoskeletal:        General: No swelling.     Right lower leg: No edema.     Left lower leg: No edema.  Lymphadenopathy:     Cervical: No cervical adenopathy.     Upper Body:     Right upper body: No supraclavicular or axillary adenopathy.     Left upper body: No supraclavicular or axillary adenopathy.     Lower Body: No right inguinal adenopathy. No left inguinal adenopathy.  Skin:    General: Skin is warm.     Coloration: Skin is not jaundiced.     Findings: No lesion or rash.  Neurological:     General: No focal deficit present.     Mental Status: She is alert and oriented to person, place, and time. Mental status is at baseline.  Psychiatric:  Mood and Affect: Mood normal.        Behavior: Behavior normal.        Thought Content: Thought content normal.    LABS:      Latest Ref Rng & Units 02/09/2024    1:21 PM 01/19/2024   11:20 AM 12/29/2023   11:09 AM  CBC  WBC 4.0 - 10.5 K/uL 6.0  6.3  9.2   Hemoglobin 12.0 - 15.0 g/dL 88.7  88.6  89.1   Hematocrit 36.0 - 46.0 % 33.5  33.8  32.3   Platelets 150 - 400 K/uL 220  199  286       Latest Ref Rng & Units 02/09/2024    1:21 PM 01/19/2024   11:20 AM 12/29/2023   11:09 AM  CMP  Glucose 70 - 99 mg/dL 889  863  874   BUN 8 - 23 mg/dL 6  <5  8   Creatinine 9.55 - 1.00 mg/dL 9.18  9.26  9.22   Sodium 135 - 145 mmol/L 134  133  133   Potassium 3.5 - 5.1 mmol/L 4.2  4.6  4.2   Chloride 98 - 111 mmol/L 98  97  95   CO2  22 - 32 mmol/L 25  24  25    Calcium 8.9 - 10.3 mg/dL 89.5  89.1  89.8   Total Protein 6.5 - 8.1 g/dL 7.3  7.6  7.5   Total Bilirubin 0.0 - 1.2 mg/dL 0.4  0.3  0.4   Alkaline Phos 38 - 126 U/L 127  126  139   AST 15 - 41 U/L 20  23  15    ALT 0 - 44 U/L 17  15  8      ASSESSMENT & PLAN:  Assessment/Plan:  A 62 y.o. female with stage IVB (T2a N2a M1c1) squamous cell lung cancer, which includes spread of disease to her liver.  She will proceed with her 5th cycle of maintenance Abraxane  today.  As mentioned previously, she has tolerated her Abraxane  much better since it was decreased by 20%.  Overall, she appears to be doing well.  I will see her back in 3 weeks before she heads into her 6th cycle of maintenance Abraxane .  CT scans of her chest/abdomen/pelvis will be done a day before her next visit to ascertain her new disease baseline after 5 cycles of maintenance Abraxane .  The patient understands all the plans discussed today and is in agreement with them.    Garnetta Fedrick DELENA Kerns, MD

## 2024-02-09 ENCOUNTER — Inpatient Hospital Stay: Admitting: Oncology

## 2024-02-09 ENCOUNTER — Inpatient Hospital Stay

## 2024-02-09 ENCOUNTER — Other Ambulatory Visit: Payer: Self-pay | Admitting: Oncology

## 2024-02-09 ENCOUNTER — Encounter: Payer: Self-pay | Admitting: Oncology

## 2024-02-09 ENCOUNTER — Inpatient Hospital Stay (HOSPITAL_BASED_OUTPATIENT_CLINIC_OR_DEPARTMENT_OTHER): Admitting: Oncology

## 2024-02-09 ENCOUNTER — Inpatient Hospital Stay: Attending: Oncology

## 2024-02-09 VITALS — BP 126/83 | HR 88 | Temp 98.0°F | Resp 18 | Ht 73.0 in | Wt 265.3 lb

## 2024-02-09 DIAGNOSIS — C787 Secondary malignant neoplasm of liver and intrahepatic bile duct: Secondary | ICD-10-CM | POA: Diagnosis not present

## 2024-02-09 DIAGNOSIS — C3401 Malignant neoplasm of right main bronchus: Secondary | ICD-10-CM

## 2024-02-09 DIAGNOSIS — Z5111 Encounter for antineoplastic chemotherapy: Secondary | ICD-10-CM | POA: Insufficient documentation

## 2024-02-09 DIAGNOSIS — C3490 Malignant neoplasm of unspecified part of unspecified bronchus or lung: Secondary | ICD-10-CM | POA: Insufficient documentation

## 2024-02-09 LAB — CBC WITH DIFFERENTIAL (CANCER CENTER ONLY)
Abs Immature Granulocytes: 0.08 K/uL — ABNORMAL HIGH (ref 0.00–0.07)
Basophils Absolute: 0 K/uL (ref 0.0–0.1)
Basophils Relative: 1 %
Eosinophils Absolute: 0.1 K/uL (ref 0.0–0.5)
Eosinophils Relative: 1 %
HCT: 33.5 % — ABNORMAL LOW (ref 36.0–46.0)
Hemoglobin: 11.2 g/dL — ABNORMAL LOW (ref 12.0–15.0)
Immature Granulocytes: 1 %
Lymphocytes Relative: 14 %
Lymphs Abs: 0.9 K/uL (ref 0.7–4.0)
MCH: 29.5 pg (ref 26.0–34.0)
MCHC: 33.4 g/dL (ref 30.0–36.0)
MCV: 88.2 fL (ref 80.0–100.0)
Monocytes Absolute: 0.7 K/uL (ref 0.1–1.0)
Monocytes Relative: 12 %
Neutro Abs: 4.3 K/uL (ref 1.7–7.7)
Neutrophils Relative %: 71 %
Platelet Count: 220 K/uL (ref 150–400)
RBC: 3.8 MIL/uL — ABNORMAL LOW (ref 3.87–5.11)
RDW: 14.9 % (ref 11.5–15.5)
WBC Count: 6 K/uL (ref 4.0–10.5)
nRBC: 0 % (ref 0.0–0.2)

## 2024-02-09 LAB — CMP (CANCER CENTER ONLY)
ALT: 17 U/L (ref 0–44)
AST: 20 U/L (ref 15–41)
Albumin: 4 g/dL (ref 3.5–5.0)
Alkaline Phosphatase: 127 U/L — ABNORMAL HIGH (ref 38–126)
Anion gap: 11 (ref 5–15)
BUN: 6 mg/dL — ABNORMAL LOW (ref 8–23)
CO2: 25 mmol/L (ref 22–32)
Calcium: 10.4 mg/dL — ABNORMAL HIGH (ref 8.9–10.3)
Chloride: 98 mmol/L (ref 98–111)
Creatinine: 0.81 mg/dL (ref 0.44–1.00)
GFR, Estimated: >60 mL/min (ref >60)
Glucose, Bld: 110 mg/dL — ABNORMAL HIGH (ref 70–99)
Potassium: 4.2 mmol/L (ref 3.5–5.1)
Sodium: 134 mmol/L — ABNORMAL LOW (ref 135–145)
Total Bilirubin: 0.4 mg/dL (ref 0.0–1.2)
Total Protein: 7.3 g/dL (ref 6.5–8.1)

## 2024-02-09 MED ORDER — SODIUM CHLORIDE 0.9 % IV SOLN: INTRAVENOUS | Status: DC

## 2024-02-09 MED ORDER — SODIUM CHLORIDE 0.9% FLUSH: 10.0000 mL | INTRAVENOUS | Status: DC | PRN

## 2024-02-09 MED ORDER — PACLITAXEL PROTEIN-BOUND CHEMO INJECTION 100 MG
124.8000 mg/m2 | Freq: Once | INTRAVENOUS | Status: AC
  Administered 2024-02-09: 300 mg via INTRAVENOUS
  Filled 2024-02-09: qty 60

## 2024-02-09 MED ORDER — ONDANSETRON HCL 4 MG/2ML IJ SOLN
8.0000 mg | Freq: Once | INTRAMUSCULAR | Status: AC
  Administered 2024-02-09: 8 mg via INTRAVENOUS
  Filled 2024-02-09: qty 4

## 2024-02-09 MED ORDER — DEXAMETHASONE SOD PHOSPHATE PF 10 MG/ML IJ SOLN
4.0000 mg | Freq: Once | INTRAMUSCULAR | Status: AC
  Administered 2024-02-09: 4 mg via INTRAVENOUS

## 2024-02-10 ENCOUNTER — Other Ambulatory Visit: Payer: Self-pay

## 2024-02-10 DIAGNOSIS — B370 Candidal stomatitis: Secondary | ICD-10-CM

## 2024-02-16 ENCOUNTER — Encounter: Payer: Self-pay | Admitting: Oncology

## 2024-02-29 ENCOUNTER — Ambulatory Visit (INDEPENDENT_AMBULATORY_CARE_PROVIDER_SITE_OTHER)
Admission: RE | Admit: 2024-02-29 | Discharge: 2024-02-29 | Disposition: A | Source: Ambulatory Visit | Attending: Oncology | Admitting: Oncology

## 2024-02-29 DIAGNOSIS — C3401 Malignant neoplasm of right main bronchus: Secondary | ICD-10-CM

## 2024-02-29 MED ORDER — IOHEXOL 300 MG/ML  SOLN
100.0000 mL | Freq: Once | INTRAMUSCULAR | Status: AC | PRN
  Administered 2024-02-29: 100 mL via INTRAVENOUS

## 2024-02-29 NOTE — Progress Notes (Unsigned)
 [facility] at [facility] [address] [place],  [place]  [address] [phone]  Clinic Day: 03/01/2024  Referring physician: Sabas Norleen PARAS., MD   HISTORY OF PRESENT ILLNESS:  The patient is a 63 y.o. female with stage IVB (T2a N2a M1c1) squamous cell lung cancer, which includes spread of disease to her liver.  She comes in today to go over her CT scans to ascertain her new disease baseline after receiving 5 cycles of maintenance Abraxane .  The patient claims to have tolerated her 5th cycle of Abraxane  very well.  She has definitely noticed an improvement in her daily quality of life since a dose reduction was done with cycle #3.  She denies having any new respiratory symptoms which concern her for progression of her lung cancer.    With respect to her lung cancer history, her maintenance Abraxane  was preceded by 6 cycles of palliative carboplatin -based chemotherapy.  Of note, the patient had an allergic reaction to paclitaxel  after her fourth cycle of treatment.  Based upon this, she was switched to carboplatin /Abraxane  for her 5th and 6th cycles.  A 20% dose reduction was done with her third cycle of maintenance Abraxane , for which the patient claims helped immensely with her being able to tolerate this agent.    PHYSICAL EXAM:  Blood pressure 121/87, pulse 94, temperature 99 F (37.2 C), temperature source Oral, resp. rate 16, height 6' 1 (1.854 m), weight 276 lb 9.6 oz (125.5 kg), last menstrual period 11/15/2011, SpO2 94%. Wt Readings from Last 3 Encounters:  03/01/24 276 lb 9.6 oz (125.5 kg)  02/09/24 265 lb 4.8 oz (120.3 kg)  01/27/24 273 lb (123.8 kg)   Body mass index is 36.49 kg/m. Performance status (ECOG): 2 - Symptomatic, <50% confined to bed Physical Exam Constitutional:      Appearance: Normal appearance. She is not ill-appearing.     Comments: A pleasant woman in a wheelchair.    HENT:     Mouth/Throat:     Mouth: Mucous membranes are moist.      Pharynx: Oropharynx is clear. No oropharyngeal exudate or posterior oropharyngeal erythema.  Cardiovascular:     Rate and Rhythm: Normal rate and regular rhythm.     Heart sounds: No murmur heard.    No friction rub. No gallop.  Pulmonary:     Effort: Pulmonary effort is normal. No respiratory distress.     Breath sounds: No wheezing, rhonchi or rales.  Abdominal:     General: Bowel sounds are normal. There is no distension.     Palpations: Abdomen is soft. There is no mass.     Tenderness: There is no abdominal tenderness.  Musculoskeletal:        General: No swelling.     Right lower leg: No edema.     Left lower leg: No edema.  Lymphadenopathy:     Cervical: No cervical adenopathy.     Upper Body:     Right upper body: No supraclavicular or axillary adenopathy.     Left upper body: No supraclavicular or axillary adenopathy.     Lower Body: No right inguinal adenopathy. No left inguinal adenopathy.  Skin:    General: Skin is warm.     Coloration: Skin is not jaundiced.     Findings: No lesion or rash.  Neurological:     General: No focal deficit present.     Mental Status: She is alert and oriented to person, place, and time. Mental status is  at baseline.  Psychiatric:        Mood and Affect: Mood normal.        Behavior: Behavior normal.        Thought Content: Thought content normal.    LABS:      Latest Ref Rng & Units 03/01/2024   10:20 AM 02/09/2024    1:21 PM 01/19/2024   11:20 AM  CBC  WBC 4.0 - 10.5 K/uL 4.9  6.0  6.3   Hemoglobin 12.0 - 15.0 g/dL 88.4  88.7  88.6   Hematocrit 36.0 - 46.0 % 35.0  33.5  33.8   Platelets 150 - 400 K/uL 232  220  199       Latest Ref Rng & Units 03/01/2024   10:20 AM 02/09/2024    1:21 PM 01/19/2024   11:20 AM  CMP  Glucose 70 - 99 mg/dL 859  889  863   BUN 8 - 23 mg/dL 7  6  <5   Creatinine 9.55 - 1.00 mg/dL 9.31  9.18  9.26   Sodium 135 - 145 mmol/L 132  134  133   Potassium 3.5 - 5.1 mmol/L 3.7  4.2  4.6   Chloride  98 - 111 mmol/L 96  98  97   CO2 22 - 32 mmol/L 24  25  24    Calcium 8.9 - 10.3 mg/dL 89.4  89.5  89.1   Total Protein 6.5 - 8.1 g/dL 7.3  7.3  7.6   Total Bilirubin 0.0 - 1.2 mg/dL 0.3  0.4  0.3   Alkaline Phos 38 - 126 U/L 121  127  126   AST 15 - 41 U/L 23  20  23    ALT 0 - 44 U/L 14  17  15     SCANS: CT scans of the chest/abdomen/pelvis done on 02/29/2024 revealed the following:   FINDINGS:   CHEST:   MEDIASTINUM AND LYMPH NODES: Right sided port-a-cath has tip at the cavoatrial junction. Heart is normal in size. Minimal calcified plaque over the left anterior descending coronary artery. Mild calcified plaque over the right coronary artery. Thoracic aorta demonstrates mild calcified plaque of the descending aorta. Pulmonary arterial system is unremarkable. Remaining vascular structures are normal. No mediastinal, hilar or axillary lymphadenopathy.   LUNGS AND PLEURA: Lungs are adequately inflated without acute airspace process or effusion. There is stable focal density over the medial right lower lobe/infrahilar region compatible with known site of lung cancer. This is less masslike compared to the previous PET CT 04/01/2023. Stable subcentimeter nodule over the lateral left lower lobe (image 85) measuring approximately 7-8 mm. Stable 3-4 mm nodule over the right upper lobe (image 51). Narrowed bronchus extending into the region of consolidation described over the medial right lower lobe unchanged.   ABDOMEN AND PELVIS:   LIVER: No definite focal liver mass.   GALLBLADDER AND BILE DUCTS: Gallbladder and biliary tree are normal.   SPLEEN: No acute abnormality.   PANCREAS: No acute abnormality.   ADRENAL GLANDS: No acute abnormality.   KIDNEYS, URETERS AND BLADDER: Kidneys are normal in size without hydronephrosis. A few small bilateral renal cysts unchanged. No stones in the kidneys or ureters. No perinephric or periureteral stranding. Urinary bladder is  unremarkable.   GI AND BOWEL: Stomach demonstrates no acute abnormality. There is no bowel obstruction.   REPRODUCTIVE ORGANS: Previous hysterectomy. Adnexal regions are unremarkable.   PERITONEUM AND RETROPERITONEUM: No ascites. No free air.   VASCULATURE: Plaque is present over  the abdominal aorta which is normal in caliber. Remaining vascular structures are unremarkable.   ABDOMINAL AND PELVIS LYMPH NODES: No lymphadenopathy.   BONES AND SOFT TISSUES: Pleural effusion hardware intact and unchanged from L1 to L5. No acute osseous abnormality. No focal soft tissue abnormality.   IMPRESSION: 1. Known right lower lobe lung cancer with decreased masslike appearance compared to prior PET CT 04/01/2023. No evidence of metastatic disease in the abdomen or pelvis. 2. Stable 78 mm subcentimeter nodule in the lateral left lower lobe. Stable 34 mm nodule in the right upper lobe. Recommend attention on follow-up.  ASSESSMENT & PLAN:  Assessment/Plan:  A 63 y.o. female with stage IVB (T2a N2a M1c1) squamous cell lung cancer, which includes spread of disease to her liver.  In clinic today, I went over all of her CT scan images with her, for which she could see her disease remains under ideal control.  There remains no evidence of of persistent metastatic disease in her liver.  Furthermore, the disease in the right lung is stable, if not slightly better.  Understandably, the patient was pleased with her CT scan images.  She will proceed with her 6th cycle of maintenance Abraxane  today.  Overall, she appears to be doing well.  I will see her back in 3 weeks before she heads into her 7th cycle of maintenance Abraxane .  The patient understands all the plans discussed today and is in agreement with them.    Mccall Will DELENA Kerns, MD

## 2024-03-01 ENCOUNTER — Encounter: Payer: Self-pay | Admitting: Oncology

## 2024-03-01 ENCOUNTER — Inpatient Hospital Stay: Attending: Oncology

## 2024-03-01 ENCOUNTER — Inpatient Hospital Stay: Attending: Oncology | Admitting: Oncology

## 2024-03-01 ENCOUNTER — Telehealth: Payer: Self-pay | Admitting: Oncology

## 2024-03-01 ENCOUNTER — Inpatient Hospital Stay

## 2024-03-01 VITALS — BP 121/87 | HR 94 | Temp 99.0°F | Resp 16 | Ht 73.0 in | Wt 276.6 lb

## 2024-03-01 DIAGNOSIS — C3401 Malignant neoplasm of right main bronchus: Secondary | ICD-10-CM

## 2024-03-01 DIAGNOSIS — C3490 Malignant neoplasm of unspecified part of unspecified bronchus or lung: Secondary | ICD-10-CM | POA: Insufficient documentation

## 2024-03-01 DIAGNOSIS — C787 Secondary malignant neoplasm of liver and intrahepatic bile duct: Secondary | ICD-10-CM | POA: Insufficient documentation

## 2024-03-01 DIAGNOSIS — Z452 Encounter for adjustment and management of vascular access device: Secondary | ICD-10-CM | POA: Insufficient documentation

## 2024-03-01 DIAGNOSIS — Z5111 Encounter for antineoplastic chemotherapy: Secondary | ICD-10-CM | POA: Insufficient documentation

## 2024-03-01 LAB — CBC WITH DIFFERENTIAL (CANCER CENTER ONLY)
Abs Immature Granulocytes: 0.1 K/uL — ABNORMAL HIGH (ref 0.00–0.07)
Basophils Absolute: 0 K/uL (ref 0.0–0.1)
Basophils Relative: 1 %
Eosinophils Absolute: 0.1 K/uL (ref 0.0–0.5)
Eosinophils Relative: 1 %
HCT: 35 % — ABNORMAL LOW (ref 36.0–46.0)
Hemoglobin: 11.5 g/dL — ABNORMAL LOW (ref 12.0–15.0)
Immature Granulocytes: 2 %
Lymphocytes Relative: 19 %
Lymphs Abs: 0.9 K/uL (ref 0.7–4.0)
MCH: 28.9 pg (ref 26.0–34.0)
MCHC: 32.9 g/dL (ref 30.0–36.0)
MCV: 87.9 fL (ref 80.0–100.0)
Monocytes Absolute: 0.6 K/uL (ref 0.1–1.0)
Monocytes Relative: 12 %
Neutro Abs: 3.2 K/uL (ref 1.7–7.7)
Neutrophils Relative %: 65 %
Platelet Count: 232 K/uL (ref 150–400)
RBC: 3.98 MIL/uL (ref 3.87–5.11)
RDW: 15 % (ref 11.5–15.5)
WBC Count: 4.9 K/uL (ref 4.0–10.5)
nRBC: 0 % (ref 0.0–0.2)

## 2024-03-01 LAB — CMP (CANCER CENTER ONLY)
ALT: 14 U/L (ref 0–44)
AST: 23 U/L (ref 15–41)
Albumin: 4.2 g/dL (ref 3.5–5.0)
Alkaline Phosphatase: 121 U/L (ref 38–126)
Anion gap: 12 (ref 5–15)
BUN: 7 mg/dL — ABNORMAL LOW (ref 8–23)
CO2: 24 mmol/L (ref 22–32)
Calcium: 10.5 mg/dL — ABNORMAL HIGH (ref 8.9–10.3)
Chloride: 96 mmol/L — ABNORMAL LOW (ref 98–111)
Creatinine: 0.68 mg/dL (ref 0.44–1.00)
GFR, Estimated: >60 mL/min (ref >60)
Glucose, Bld: 140 mg/dL — ABNORMAL HIGH (ref 70–99)
Potassium: 3.7 mmol/L (ref 3.5–5.1)
Sodium: 132 mmol/L — ABNORMAL LOW (ref 135–145)
Total Bilirubin: 0.3 mg/dL (ref 0.0–1.2)
Total Protein: 7.3 g/dL (ref 6.5–8.1)

## 2024-03-01 MED ORDER — SODIUM CHLORIDE 0.9 % IV SOLN: INTRAVENOUS | Status: DC

## 2024-03-01 MED ORDER — DEXAMETHASONE SOD PHOSPHATE PF 10 MG/ML IJ SOLN
4.0000 mg | Freq: Once | INTRAMUSCULAR | Status: AC
  Administered 2024-03-01: 4 mg via INTRAVENOUS

## 2024-03-01 MED ORDER — PACLITAXEL PROTEIN-BOUND CHEMO INJECTION 100 MG
124.8000 mg/m2 | Freq: Once | INTRAVENOUS | Status: AC
  Administered 2024-03-01: 300 mg via INTRAVENOUS
  Filled 2024-03-01: qty 60

## 2024-03-01 MED ORDER — ONDANSETRON HCL 4 MG/2ML IJ SOLN
8.0000 mg | Freq: Once | INTRAMUSCULAR | Status: AC
  Administered 2024-03-01: 8 mg via INTRAVENOUS
  Filled 2024-03-01: qty 4

## 2024-03-01 MED ORDER — ALTEPLASE 2 MG IJ SOLR
2.0000 mg | Freq: Once | INTRAMUSCULAR | Status: AC | PRN
  Administered 2024-03-01: 2 mg
  Filled 2024-03-01: qty 2

## 2024-03-01 NOTE — Telephone Encounter (Signed)
 Patient has been scheduled for follow-up visit per 03/01/2024 LOS.  Pt given an appt calendar with date and time.

## 2024-03-01 NOTE — Progress Notes (Signed)
 PT C/O ITCHY RASH ON CHEST AND POSTERIOR BILATERAL ARMS X 1.5 WEEKS

## 2024-03-01 NOTE — Patient Instructions (Signed)
Paclitaxel Nanoparticle Albumin-Bound Injection What is this medication? NANOPARTICLE ALBUMIN-BOUND PACLITAXEL (Na no PAHR ti kuhl al BYOO muhn-bound PAK li TAX el) treats some types of cancer. It works by slowing down the growth of cancer cells. This medicine may be used for other purposes; ask your health care provider or pharmacist if you have questions. COMMON BRAND NAME(S): Abraxane What should I tell my care team before I take this medication? They need to know if you have any of these conditions: Liver disease Low white blood cell levels An unusual or allergic reaction to paclitaxel, albumin, other medications, foods, dyes, or preservatives If you or your partner are pregnant or trying to get pregnant Breast-feeding How should I use this medication? This medication is injected into a vein. It is given by your care team in a hospital or clinic setting. Talk to your care team about the use of this medication in children. Special care may be needed. Overdosage: If you think you have taken too much of this medicine contact a poison control center or emergency room at once. NOTE: This medicine is only for you. Do not share this medicine with others. What if I miss a dose? Keep appointments for follow-up doses. It is important not to miss your dose. Call your care team if you are unable to keep an appointment. What may interact with this medication? Other medications may affect the way this medication works. Talk with your care team about all of the medications you take. They may suggest changes to your treatment plan to lower the risk of side effects and to make sure your medications work as intended. This list may not describe all possible interactions. Give your health care provider a list of all the medicines, herbs, non-prescription drugs, or dietary supplements you use. Also tell them if you smoke, drink alcohol, or use illegal drugs. Some items may interact with your medicine. What  should I watch for while using this medication? Your condition will be monitored carefully while you are receiving this medication. You may need blood work while taking this medication. This medication may make you feel generally unwell. This is not uncommon as chemotherapy can affect healthy cells as well as cancer cells. Report any side effects. Continue your course of treatment even though you feel ill unless your care team tells you to stop. This medication can cause serious allergic reactions. To reduce the risk, your care team may give you other medications to take before receiving this one. Be sure to follow the directions from your care team. This medication may increase your risk of getting an infection. Call your care team for advice if you get a fever, chills, sore throat, or other symptoms of a cold or flu. Do not treat yourself. Try to avoid being around people who are sick. This medication may increase your risk to bruise or bleed. Call your care team if you notice any unusual bleeding. Be careful brushing or flossing your teeth or using a toothpick because you may get an infection or bleed more easily. If you have any dental work done, tell your dentist you are receiving this medication. Talk to your care team if you or your partner may be pregnant. Serious birth defects can occur if you take this medication during pregnancy and for 6 months after the last dose. You will need a negative pregnancy test before starting this medication. Contraception is recommended while taking this medication and for 6 months after the last dose. Your care team   can help you find the option that works for you. If your partner can get pregnant, use a condom during sex while taking this medication and for 3 months after the last dose. Do not breastfeed while taking this medication and for 2 weeks after the last dose. This medication may cause infertility. Talk to your care team if you are concerned about your  fertility. What side effects may I notice from receiving this medication? Side effects that you should report to your care team as soon as possible: Allergic reactions--skin rash, itching, hives, swelling of the face, lips, tongue, or throat Dry cough, shortness of breath or trouble breathing Infection--fever, chills, cough, sore throat, wounds that don't heal, pain or trouble when passing urine, general feeling of discomfort or being unwell Low red blood cell level--unusual weakness or fatigue, dizziness, headache, trouble breathing Pain, tingling, or numbness in the hands or feet Stomach pain, unusual weakness or fatigue, nausea, vomiting, diarrhea, or fever that lasts longer than expected Unusual bruising or bleeding Side effects that usually do not require medical attention (report to your care team if they continue or are bothersome): Diarrhea Fatigue Hair loss Loss of appetite Nausea Vomiting This list may not describe all possible side effects. Call your doctor for medical advice about side effects. You may report side effects to FDA at 1-800-FDA-1088. Where should I keep my medication? This medication is given in a hospital or clinic. It will not be stored at home. NOTE: This sheet is a summary. It may not cover all possible information. If you have questions about this medicine, talk to your doctor, pharmacist, or health care provider.  2024 Elsevier/Gold Standard (2021-07-30 00:00:00)  

## 2024-03-05 ENCOUNTER — Other Ambulatory Visit: Payer: Self-pay

## 2024-03-05 ENCOUNTER — Other Ambulatory Visit (HOSPITAL_BASED_OUTPATIENT_CLINIC_OR_DEPARTMENT_OTHER): Payer: Self-pay

## 2024-03-05 ENCOUNTER — Encounter: Payer: Self-pay | Admitting: Oncology

## 2024-03-05 DIAGNOSIS — B370 Candidal stomatitis: Secondary | ICD-10-CM

## 2024-03-05 DIAGNOSIS — K1379 Other lesions of oral mucosa: Secondary | ICD-10-CM

## 2024-03-05 MED ORDER — PREDNISOLONE SODIUM PHOSPHATE 15 MG/5ML PO SOLN
Freq: Four times a day (QID) | OROMUCOSAL | 0 refills | Status: AC
  Filled 2024-03-05: qty 250, 13d supply, fill #0

## 2024-03-05 MED ORDER — FLUCONAZOLE 100 MG PO TABS
ORAL_TABLET | ORAL | 1 refills | Status: DC
  Filled 2024-03-05: qty 6, 5d supply, fill #0
  Filled 2024-03-20: qty 6, 5d supply, fill #1

## 2024-03-16 ENCOUNTER — Telehealth: Payer: Self-pay | Admitting: *Deleted

## 2024-03-16 NOTE — Telephone Encounter (Signed)
 ATC no ans nor vmail There are still no PFT results in chart. Per Oct visit with Dr. Darlean:  Please schedule a follow up visit in 3 months but call sooner if needed  - bring  a copy of your PFTs with you

## 2024-03-19 NOTE — Telephone Encounter (Signed)
 Patient advised to pick up a copy of last pft from previous pulm doctor. She will bring with her to next ov. NFN

## 2024-03-20 ENCOUNTER — Inpatient Hospital Stay

## 2024-03-20 ENCOUNTER — Inpatient Hospital Stay (HOSPITAL_BASED_OUTPATIENT_CLINIC_OR_DEPARTMENT_OTHER): Admitting: Oncology

## 2024-03-20 ENCOUNTER — Other Ambulatory Visit (HOSPITAL_BASED_OUTPATIENT_CLINIC_OR_DEPARTMENT_OTHER): Payer: Self-pay

## 2024-03-20 ENCOUNTER — Telehealth: Payer: Self-pay | Admitting: Oncology

## 2024-03-20 ENCOUNTER — Encounter: Payer: Self-pay | Admitting: Oncology

## 2024-03-20 VITALS — BP 130/83 | HR 91 | Temp 98.9°F | Resp 18 | Ht 73.0 in | Wt 275.0 lb

## 2024-03-20 VITALS — BP 130/83 | HR 91 | Temp 98.9°F | Resp 16 | Ht 73.0 in | Wt 275.5 lb

## 2024-03-20 DIAGNOSIS — C3401 Malignant neoplasm of right main bronchus: Secondary | ICD-10-CM | POA: Diagnosis not present

## 2024-03-20 DIAGNOSIS — Z5111 Encounter for antineoplastic chemotherapy: Secondary | ICD-10-CM | POA: Diagnosis not present

## 2024-03-20 LAB — CMP (CANCER CENTER ONLY)
ALT: 13 U/L (ref 0–44)
AST: 24 U/L (ref 15–41)
Albumin: 4.5 g/dL (ref 3.5–5.0)
Alkaline Phosphatase: 120 U/L (ref 38–126)
Anion gap: 11 (ref 5–15)
BUN: 9 mg/dL (ref 8–23)
CO2: 25 mmol/L (ref 22–32)
Calcium: 10.4 mg/dL — ABNORMAL HIGH (ref 8.9–10.3)
Chloride: 97 mmol/L — ABNORMAL LOW (ref 98–111)
Creatinine: 0.68 mg/dL (ref 0.44–1.00)
GFR, Estimated: >60 mL/min
Glucose, Bld: 115 mg/dL — ABNORMAL HIGH (ref 70–99)
Potassium: 4.2 mmol/L (ref 3.5–5.1)
Sodium: 132 mmol/L — ABNORMAL LOW (ref 135–145)
Total Bilirubin: 0.4 mg/dL (ref 0.0–1.2)
Total Protein: 7.3 g/dL (ref 6.5–8.1)

## 2024-03-20 LAB — CBC WITH DIFFERENTIAL (CANCER CENTER ONLY)
Abs Immature Granulocytes: 0.07 K/uL (ref 0.00–0.07)
Basophils Absolute: 0 K/uL (ref 0.0–0.1)
Basophils Relative: 1 %
Eosinophils Absolute: 0.1 K/uL (ref 0.0–0.5)
Eosinophils Relative: 1 %
HCT: 33.5 % — ABNORMAL LOW (ref 36.0–46.0)
Hemoglobin: 11.2 g/dL — ABNORMAL LOW (ref 12.0–15.0)
Immature Granulocytes: 1 %
Lymphocytes Relative: 12 %
Lymphs Abs: 0.9 K/uL (ref 0.7–4.0)
MCH: 29.2 pg (ref 26.0–34.0)
MCHC: 33.4 g/dL (ref 30.0–36.0)
MCV: 87.5 fL (ref 80.0–100.0)
Monocytes Absolute: 0.8 K/uL (ref 0.1–1.0)
Monocytes Relative: 11 %
Neutro Abs: 5.4 K/uL (ref 1.7–7.7)
Neutrophils Relative %: 74 %
Platelet Count: 234 K/uL (ref 150–400)
RBC: 3.83 MIL/uL — ABNORMAL LOW (ref 3.87–5.11)
RDW: 16 % — ABNORMAL HIGH (ref 11.5–15.5)
WBC Count: 7.3 K/uL (ref 4.0–10.5)
nRBC: 0 % (ref 0.0–0.2)

## 2024-03-20 MED ORDER — DEXAMETHASONE SOD PHOSPHATE PF 10 MG/ML IJ SOLN
10.0000 mg | Freq: Once | INTRAMUSCULAR | Status: AC
  Administered 2024-03-20: 10 mg via INTRAVENOUS

## 2024-03-20 MED ORDER — PACLITAXEL PROTEIN-BOUND CHEMO INJECTION 100 MG
124.8000 mg/m2 | Freq: Once | INTRAVENOUS | Status: AC
  Administered 2024-03-20: 300 mg via INTRAVENOUS
  Filled 2024-03-20: qty 60

## 2024-03-20 MED ORDER — SODIUM CHLORIDE 0.9 % IV SOLN: INTRAVENOUS | Status: DC

## 2024-03-20 MED ORDER — DEXAMETHASONE SOD PHOSPHATE PF 10 MG/ML IJ SOLN: 4.0000 mg | Freq: Once | INTRAMUSCULAR | Status: DC

## 2024-03-20 MED ORDER — ONDANSETRON HCL 40 MG/20ML IJ SOLN
8.0000 mg | Freq: Once | INTRAMUSCULAR | Status: AC
  Administered 2024-03-20: 8 mg via INTRAVENOUS
  Filled 2024-03-20: qty 4

## 2024-03-20 NOTE — Telephone Encounter (Signed)
 Patient has been scheduled for follow-up visit per 03/20/2024 LOS.  Pt given an appt calendar with date and time.

## 2024-03-20 NOTE — Patient Instructions (Signed)
Paclitaxel Nanoparticle Albumin-Bound Injection What is this medication? NANOPARTICLE ALBUMIN-BOUND PACLITAXEL (Na no PAHR ti kuhl al BYOO muhn-bound PAK li TAX el) treats some types of cancer. It works by slowing down the growth of cancer cells. This medicine may be used for other purposes; ask your health care provider or pharmacist if you have questions. COMMON BRAND NAME(S): Abraxane What should I tell my care team before I take this medication? They need to know if you have any of these conditions: Liver disease Low white blood cell levels An unusual or allergic reaction to paclitaxel, albumin, other medications, foods, dyes, or preservatives If you or your partner are pregnant or trying to get pregnant Breast-feeding How should I use this medication? This medication is injected into a vein. It is given by your care team in a hospital or clinic setting. Talk to your care team about the use of this medication in children. Special care may be needed. Overdosage: If you think you have taken too much of this medicine contact a poison control center or emergency room at once. NOTE: This medicine is only for you. Do not share this medicine with others. What if I miss a dose? Keep appointments for follow-up doses. It is important not to miss your dose. Call your care team if you are unable to keep an appointment. What may interact with this medication? Other medications may affect the way this medication works. Talk with your care team about all of the medications you take. They may suggest changes to your treatment plan to lower the risk of side effects and to make sure your medications work as intended. This list may not describe all possible interactions. Give your health care provider a list of all the medicines, herbs, non-prescription drugs, or dietary supplements you use. Also tell them if you smoke, drink alcohol, or use illegal drugs. Some items may interact with your medicine. What  should I watch for while using this medication? Your condition will be monitored carefully while you are receiving this medication. You may need blood work while taking this medication. This medication may make you feel generally unwell. This is not uncommon as chemotherapy can affect healthy cells as well as cancer cells. Report any side effects. Continue your course of treatment even though you feel ill unless your care team tells you to stop. This medication can cause serious allergic reactions. To reduce the risk, your care team may give you other medications to take before receiving this one. Be sure to follow the directions from your care team. This medication may increase your risk of getting an infection. Call your care team for advice if you get a fever, chills, sore throat, or other symptoms of a cold or flu. Do not treat yourself. Try to avoid being around people who are sick. This medication may increase your risk to bruise or bleed. Call your care team if you notice any unusual bleeding. Be careful brushing or flossing your teeth or using a toothpick because you may get an infection or bleed more easily. If you have any dental work done, tell your dentist you are receiving this medication. Talk to your care team if you or your partner may be pregnant. Serious birth defects can occur if you take this medication during pregnancy and for 6 months after the last dose. You will need a negative pregnancy test before starting this medication. Contraception is recommended while taking this medication and for 6 months after the last dose. Your care team   can help you find the option that works for you. If your partner can get pregnant, use a condom during sex while taking this medication and for 3 months after the last dose. Do not breastfeed while taking this medication and for 2 weeks after the last dose. This medication may cause infertility. Talk to your care team if you are concerned about your  fertility. What side effects may I notice from receiving this medication? Side effects that you should report to your care team as soon as possible: Allergic reactions--skin rash, itching, hives, swelling of the face, lips, tongue, or throat Dry cough, shortness of breath or trouble breathing Infection--fever, chills, cough, sore throat, wounds that don't heal, pain or trouble when passing urine, general feeling of discomfort or being unwell Low red blood cell level--unusual weakness or fatigue, dizziness, headache, trouble breathing Pain, tingling, or numbness in the hands or feet Stomach pain, unusual weakness or fatigue, nausea, vomiting, diarrhea, or fever that lasts longer than expected Unusual bruising or bleeding Side effects that usually do not require medical attention (report to your care team if they continue or are bothersome): Diarrhea Fatigue Hair loss Loss of appetite Nausea Vomiting This list may not describe all possible side effects. Call your doctor for medical advice about side effects. You may report side effects to FDA at 1-800-FDA-1088. Where should I keep my medication? This medication is given in a hospital or clinic. It will not be stored at home. NOTE: This sheet is a summary. It may not cover all possible information. If you have questions about this medicine, talk to your doctor, pharmacist, or health care provider.  2024 Elsevier/Gold Standard (2021-07-30 00:00:00)  

## 2024-03-20 NOTE — Progress Notes (Signed)
 " [facility] at [facility] [address] [place],  [place]  [address] [phone]  Clinic Day: 03/20/2024  Referring physician: Sabas Norleen PARAS., MD   HISTORY OF PRESENT ILLNESS:  The patient is a 63 y.o. female with stage IVB (T2a N2a M1c1) squamous cell lung cancer, which includes spread of disease to her liver.  She comes in today to be evaluated before proceeding with her 7th cycle of maintenance Abraxane .  The patient claims to have tolerated her 6th cycle of Abraxane  very well.  She has definitely noticed an improvement in her daily quality of life since a dose reduction was done with cycle #3.  She denies having any new respiratory symptoms which concern her for progression of her lung cancer.    With respect to her lung cancer history, her maintenance Abraxane  was preceded by 6 cycles of palliative carboplatin -based chemotherapy.  Of note, the patient had an allergic reaction to paclitaxel  after her fourth cycle of carboplatin /paclitaxel  treatment.  Based upon this, she was switched to carboplatin /Abraxane  for her 5th and 6th cycles.  A 20% dose reduction was done with her third cycle of maintenance Abraxane , for which the patient claims helped immensely with her being able to tolerate this agent.    PHYSICAL EXAM:  Blood pressure 130/83, pulse 91, temperature 98.9 F (37.2 C), temperature source Oral, resp. rate 16, height 6' 1 (1.854 m), weight 275 lb 8 oz (125 kg), last menstrual period 11/15/2011, SpO2 94%. Wt Readings from Last 3 Encounters:  03/20/24 275 lb (124.7 kg)  03/20/24 275 lb 8 oz (125 kg)  03/01/24 276 lb 9.6 oz (125.5 kg)   Body mass index is 36.35 kg/m. Performance status (ECOG): 2 - Symptomatic, <50% confined to bed Physical Exam Constitutional:      Appearance: Normal appearance. She is not ill-appearing.     Comments: A pleasant woman in a wheelchair.    HENT:     Mouth/Throat:     Mouth: Mucous membranes are moist.     Pharynx:  Oropharynx is clear. No oropharyngeal exudate or posterior oropharyngeal erythema.  Cardiovascular:     Rate and Rhythm: Normal rate and regular rhythm.     Heart sounds: No murmur heard.    No friction rub. No gallop.  Pulmonary:     Effort: Pulmonary effort is normal. No respiratory distress.     Breath sounds: No wheezing, rhonchi or rales.  Abdominal:     General: Bowel sounds are normal. There is no distension.     Palpations: Abdomen is soft. There is no mass.     Tenderness: There is no abdominal tenderness.  Musculoskeletal:        General: No swelling.     Right lower leg: No edema.     Left lower leg: No edema.  Lymphadenopathy:     Cervical: No cervical adenopathy.     Upper Body:     Right upper body: No supraclavicular or axillary adenopathy.     Left upper body: No supraclavicular or axillary adenopathy.     Lower Body: No right inguinal adenopathy. No left inguinal adenopathy.  Skin:    General: Skin is warm.     Coloration: Skin is not jaundiced.     Findings: No lesion or rash.  Neurological:     General: No focal deficit present.     Mental Status: She is alert and oriented to person, place, and time. Mental status is at baseline.  Psychiatric:  Mood and Affect: Mood normal.        Behavior: Behavior normal.        Thought Content: Thought content normal.    LABS:      Latest Ref Rng & Units 03/20/2024    1:05 PM 03/01/2024   10:20 AM 02/09/2024    1:21 PM  CBC  WBC 4.0 - 10.5 K/uL 7.3  4.9  6.0   Hemoglobin 12.0 - 15.0 g/dL 88.7  88.4  88.7   Hematocrit 36.0 - 46.0 % 33.5  35.0  33.5   Platelets 150 - 400 K/uL 234  232  220       Latest Ref Rng & Units 03/20/2024    1:05 PM 03/01/2024   10:20 AM 02/09/2024    1:21 PM  CMP  Glucose 70 - 99 mg/dL 884  859  889   BUN 8 - 23 mg/dL 9  7  6    Creatinine 0.44 - 1.00 mg/dL 9.31  9.31  9.18   Sodium 135 - 145 mmol/L 132  132  134   Potassium 3.5 - 5.1 mmol/L 4.2  3.7  4.2   Chloride 98 - 111  mmol/L 97  96  98   CO2 22 - 32 mmol/L 25  24  25    Calcium 8.9 - 10.3 mg/dL 89.5  89.4  89.5   Total Protein 6.5 - 8.1 g/dL 7.3  7.3  7.3   Total Bilirubin 0.0 - 1.2 mg/dL 0.4  0.3  0.4   Alkaline Phos 38 - 126 U/L 120  121  127   AST 15 - 41 U/L 24  23  20    ALT 0 - 44 U/L 13  14  17     SCANS: CT scans of the chest/abdomen/pelvis done on 02/29/2024 revealed the following:   FINDINGS:   CHEST:   MEDIASTINUM AND LYMPH NODES: Right sided port-a-cath has tip at the cavoatrial junction. Heart is normal in size. Minimal calcified plaque over the left anterior descending coronary artery. Mild calcified plaque over the right coronary artery. Thoracic aorta demonstrates mild calcified plaque of the descending aorta. Pulmonary arterial system is unremarkable. Remaining vascular structures are normal. No mediastinal, hilar or axillary lymphadenopathy.   LUNGS AND PLEURA: Lungs are adequately inflated without acute airspace process or effusion. There is stable focal density over the medial right lower lobe/infrahilar region compatible with known site of lung cancer. This is less masslike compared to the previous PET CT 04/01/2023. Stable subcentimeter nodule over the lateral left lower lobe (image 85) measuring approximately 7-8 mm. Stable 3-4 mm nodule over the right upper lobe (image 51). Narrowed bronchus extending into the region of consolidation described over the medial right lower lobe unchanged.   ABDOMEN AND PELVIS:   LIVER: No definite focal liver mass.   GALLBLADDER AND BILE DUCTS: Gallbladder and biliary tree are normal.   SPLEEN: No acute abnormality.   PANCREAS: No acute abnormality.   ADRENAL GLANDS: No acute abnormality.   KIDNEYS, URETERS AND BLADDER: Kidneys are normal in size without hydronephrosis. A few small bilateral renal cysts unchanged. No stones in the kidneys or ureters. No perinephric or periureteral stranding. Urinary bladder is unremarkable.    GI AND BOWEL: Stomach demonstrates no acute abnormality. There is no bowel obstruction.   REPRODUCTIVE ORGANS: Previous hysterectomy. Adnexal regions are unremarkable.   PERITONEUM AND RETROPERITONEUM: No ascites. No free air.   VASCULATURE: Plaque is present over the abdominal aorta which is normal in caliber. Remaining  vascular structures are unremarkable.   ABDOMINAL AND PELVIS LYMPH NODES: No lymphadenopathy.   BONES AND SOFT TISSUES: Pleural effusion hardware intact and unchanged from L1 to L5. No acute osseous abnormality. No focal soft tissue abnormality.   IMPRESSION: 1. Known right lower lobe lung cancer with decreased masslike appearance compared to prior PET CT 04/01/2023. No evidence of metastatic disease in the abdomen or pelvis. 2. Stable 78 mm subcentimeter nodule in the lateral left lower lobe. Stable 34 mm nodule in the right upper lobe. Recommend attention on follow-up.  PATHOLOGY: Bronchoscopy results from 03-07-2023 revealed the following:   LIVER, RIGHT LOBE, LESION, NEEDLE CORE BIOPSY (05-10-23):  Metastatic poorly differentiated carcinoma with extensive necrosis (see comment)   COMMENT:   Sections show cores of hepatic parenchyma infiltrated by a tumor within desmoplastic and fibrotic stroma composed of solid sheets and nests of large atypical cells with marked nuclear cytoplasmic ratio and a variably enlarged round to oval nucleus with scattered conspicuous nucleoli.  The tumor shows frequent apoptotic bodies and geographic tumor necrosis.  No keratinization is seen. Eight immunohistochemical stains are performed with adequate control.  The tumor is diffusely and  strongly positive for the squamous marker p40.  They tumor also shows positivity for CD56 and TTF-1.  The tumor is negative for cytokeratin 7 and the pulmonary adeno marker Napsin A.  The tumor is also negative for the neuroendocrine marker synaptophysin.  The tumor is negative for cytokeratin  20.  The proliferation marker Ki-67 decorates up to 70% of the tumor.  The strong staining for p40 would tend to favor this being a poorly differentiated squamous carcinoma; however, the tumor shows cytohistomorphology and a proliferation rate more often seen in a neuroendocrine carcinoma.  The tumor also shows positivity for the neuroendocrine markers CD56 and TTF-1 suggesting at least focal neuroendocrine differentiation.  Clinical and radiologic correlation is recommended.   ASSESSMENT & PLAN:  Assessment/Plan:  A 63 y.o. female with stage IVB (T2a N2a M1c1) squamous cell lung cancer, which includes spread of disease to her liver.  She will proceed with her 7th cycle of maintenance Abraxane  today.  Overall, she appears to be doing well.  I will see her back in 3 weeks before she heads into her 8th cycle of maintenance Abraxane .  The patient understands all the plans discussed today and is in agreement with them.    Oceana Walthall DELENA Kerns, MD       "

## 2024-03-21 ENCOUNTER — Other Ambulatory Visit: Payer: Self-pay

## 2024-03-24 ENCOUNTER — Other Ambulatory Visit: Payer: Self-pay

## 2024-03-25 ENCOUNTER — Other Ambulatory Visit: Payer: Self-pay

## 2024-03-25 ENCOUNTER — Encounter: Payer: Self-pay | Admitting: Oncology

## 2024-04-01 ENCOUNTER — Encounter (HOSPITAL_BASED_OUTPATIENT_CLINIC_OR_DEPARTMENT_OTHER): Payer: Self-pay

## 2024-04-01 ENCOUNTER — Ambulatory Visit (HOSPITAL_BASED_OUTPATIENT_CLINIC_OR_DEPARTMENT_OTHER)
Admission: EM | Admit: 2024-04-01 | Discharge: 2024-04-01 | Disposition: A | Discharge status: Home / Self Care | Attending: Family Medicine | Admitting: Family Medicine

## 2024-04-01 ENCOUNTER — Ambulatory Visit (HOSPITAL_BASED_OUTPATIENT_CLINIC_OR_DEPARTMENT_OTHER): Admit: 2024-04-01 | Discharge: 2024-04-01 | Disposition: A | Admitting: Radiology

## 2024-04-01 DIAGNOSIS — R051 Acute cough: Secondary | ICD-10-CM | POA: Diagnosis not present

## 2024-04-01 LAB — POC COVID19/FLU A&B COMBO
Covid Antigen, POC: NEGATIVE
Influenza A Antigen, POC: NEGATIVE
Influenza B Antigen, POC: NEGATIVE

## 2024-04-01 NOTE — ED Provider Notes (Signed)
 " [facility]    CSN: [id number] Arrival date & time: 04/01/24  1027      History   Chief Complaint Chief Complaint  Patient presents with   Cough    HPI Susan Franco is a 64 y.o. female.   Pt c/o cough, nasal congestion, wheezing, HA, hoarseness, sore throat, and chills since Friday. She has been taking mucinex and tylenol  with slight relief. She has used an albuterol  treatment with relief. Reports she has stage 4 lung cancer.     Cough   Past Medical History:  Diagnosis Date   Anemia    Anxiety    Depression    GERD (gastroesophageal reflux disease)    History of EKG 08/2011   was reflux-done at [facility] hospital-will request   Hypertension    atenolol  and norvasc    Lung cancer (HCC)    Psoriasis    Psoriatic arthritis (HCC) 2020   [facility]   Rheumatoid arthritis (HCC) 2017   Seasonal allergies    Smoker    SOB (shortness of breath)     Patient Active Problem List   Diagnosis Date Noted   Asthmatic bronchitis , chronic (HCC) 12/28/2023   Essential hypertension 12/28/2023   Upper airway cough syndrome 12/27/2023   Nausea without vomiting 06/15/2023   Cancer of hilus of right lung (HCC) 04/05/2023   Lumbar radiculopathy 09/11/2020   Tobacco use disorder 10/18/2013    Past Surgical History:  Procedure Laterality Date   ABDOMINAL HYSTERECTOMY     Laparoscopic total hysterectomy for fibroids and bilateral salpingenctomy   ANKLE SURGERY Left    X 3   COLONOSCOPY W/ POLYPECTOMY     CYSTOSCOPY  11/30/2011   Procedure: CYSTOSCOPY;  Surgeon: Montie SHAUNNA Chesterfield, MD;  Location: [facility];  Service: Gynecology;  Laterality: N/A;   DIAGNOSTIC LAPAROSCOPY  03/30/1983   ectopic   DILATION AND CURETTAGE OF UTERUS     TRANSFORAMINAL LUMBAR INTERBODY FUSION W/ MIS 2 LEVEL N/A 09/11/2020   Procedure: Minimally invasive fusion Lumbar four-five, Lumbar five-Sacral one;  Surgeon: Debby Dorn MATSU, MD;  Location: [facility] OR;  Service: Neurosurgery;  Laterality: N/A;    TUBAL LIGATION     WISDOM TOOTH EXTRACTION      OB History     Gravida  5   Para  2   Term  2   Preterm      AB  3   Living  2      SAB  2   IAB      Ectopic  1   Multiple      Live Births               Home Medications    Prior to Admission medications  Medication Sig Start Date End Date Taking? Authorizing Provider  ALPRAZolam  (XANAX ) 0.25 MG tablet Take 0.5 mg by mouth 2 (two) times daily as needed for anxiety. 12/12/23   Parsons, Melissa A, NP  amLODipine  (NORVASC ) 5 MG tablet Take 5 mg by mouth daily.    [provider]  atenolol  (TENORMIN ) 50 MG tablet Take 50 mg by mouth daily.    [provider]  budesonide-glycopyrrolate -formoterol (BREZTRI  AEROSPHERE) 160-9-4.8 MCG/ACT AERO inhaler Inhale 2 puffs into the lungs 2 (two) times daily. Take 2 puffs first thing in am and then another 2 puffs about 12 hours later. 12/27/23   Darlean Ozell NOVAK, MD  dexamethasone  (DECADRON ) 4 MG tablet Take 2 tablets (8mg ) by mouth daily starting  the day after carboplatin  for 3 days. Take with food 05/23/23   Ezzard Peters A, MD  fluconazole  (DIFLUCAN ) 100 MG tablet Take 2 tablets by mouth on day 1; then 1 tablet by mouth on days 2-5. Take for thrush 03/05/24   Harl Setter A, NP  HYDROcodone  bit-homatropine (HYCODAN) 5-1.5 MG/5ML syrup Take 5 mLs by mouth every 6 (six) hours as needed for cough. 05/20/23   Ezzard Peters LABOR, MD  lidocaine -alum & mag hydroxide-simeth-nystatin -diphenhydrAMINE -prednisoLONE  Swish and swallow or spit out up to 4 (four) times daily if needed for thrush 03/05/24   Harl Setter A, NP  lidocaine -prilocaine  (EMLA ) cream Apply to affected area once 05/23/23   Lewis, Dequincy A, MD  loperamide (IMODIUM A-D) 2 MG tablet Take 2 mg by mouth 4 (four) times daily as needed for diarrhea or loose stools.    [provider]  magnesium  oxide (MAG-OX) 400 (240 Mg) MG tablet Take 1 tablet (400 mg total) by mouth 2 (two) times daily. 06/24/23    Lewis, Dequincy A, MD  OLANZapine  (ZYPREXA ) 5 MG tablet Take 1 tablet (5 mg total) by mouth at bedtime. For anxiety, nausea, and appetite related to cancer & chemotherapy 05/23/23   Dasie Tinnie MATSU, NP  omeprazole (PRILOSEC) 40 MG capsule Take 40 mg by mouth daily.    [provider]  ondansetron  (ZOFRAN ) 8 MG tablet Take 1 tablet (8 mg total) by mouth every 8 (eight) hours as needed for nausea or vomiting. Start on the third day after carboplatin . 05/23/23   Ezzard, Dequincy A, MD  prochlorperazine  (COMPAZINE ) 10 MG tablet Take 1 tablet (10 mg total) by mouth every 6 (six) hours as needed for nausea or vomiting. 05/23/23   Ezzard, Dequincy A, MD  sennosides-docusate sodium  (SENOKOT-S) 8.6-50 MG tablet Take 1 tablet by mouth in the morning and at bedtime. 06/27/23   [provider]  spironolactone  (ALDACTONE ) 25 MG tablet Take 25 mg by mouth daily.    [provider]  valsartan  (DIOVAN ) 80 MG tablet Take 1 tablet (80 mg total) by mouth daily. 01/27/24   Darlean Ozell NOVAK, MD  venlafaxine  XR (EFFEXOR -XR) 150 MG 24 hr capsule Take 150 mg by mouth daily. 12/13/22   [provider]  zolpidem  (AMBIEN ) 5 MG tablet Take 1 tablet (5 mg total) by mouth at bedtime as needed for sleep. 11/09/23   Harl Setter LABOR, NP    Family History Family History  Problem Relation Age of Onset   Osteoporosis Mother    Cancer Mother        renal cell, brain   CAD Father    CAD Paternal Uncle    Parkinson's disease Maternal Grandfather    Cervical cancer Paternal Grandmother    CAD Paternal Grandfather     Social History Social History[1]   Allergies   Paclitaxel  and Cosentyx [secukinumab]   Review of Systems Review of Systems  Respiratory:  Positive for cough.      Physical Exam Triage Vital Signs ED Triage Vitals  Encounter Vitals Group     BP 04/01/24 1117 118/81     Girls Systolic BP Percentile --      Girls Diastolic BP Percentile --      Boys Systolic BP  Percentile --      Boys Diastolic BP Percentile --      Pulse Rate 04/01/24 1117 88     Resp 04/01/24 1117 20     Temp 04/01/24 1117 98.7 F (37.1 C)  Temp Source 04/01/24 1117 Oral     SpO2 04/01/24 1117 92 %     Weight --      Height --      Head Circumference --      Peak Flow --      Pain Score 04/01/24 1115 8     Pain Loc --      Pain Education --      Exclude from Growth Chart --    No data found.  Updated Vital Signs BP 118/81 (BP Location: Left Arm)   Pulse 88   Temp 98.7 F (37.1 C) (Oral)   Resp 20   LMP 11/15/2011   SpO2 92%   Visual Acuity Right Eye Distance:   Left Eye Distance:   Bilateral Distance:    Right Eye Near:   Left Eye Near:    Bilateral Near:     Physical Exam Constitutional:      General: She is not in acute distress.    Appearance: Normal appearance. She is ill-appearing. She is not toxic-appearing or diaphoretic.  HENT:     Head: Normocephalic and atraumatic.     Right Ear: Tympanic membrane and ear canal normal.     Left Ear: Tympanic membrane and ear canal normal.     Nose: Congestion present.     Mouth/Throat:     Pharynx: Oropharynx is clear.  Eyes:     Conjunctiva/sclera: Conjunctivae normal.  Cardiovascular:     Rate and Rhythm: Normal rate and regular rhythm.     Pulses: Normal pulses.     Heart sounds: Normal heart sounds.  Pulmonary:     Effort: Pulmonary effort is normal.     Breath sounds: Examination of the left-lower field reveals rales. Rales present.  Skin:    General: Skin is warm and dry.  Neurological:     Mental Status: She is alert.  Psychiatric:        Mood and Affect: Mood normal.      UC Treatments / Results  Labs (all labs ordered are listed, but only abnormal results are displayed) Labs Reviewed  POC COVID19/FLU A&B COMBO - Normal    EKG   Radiology DG Chest 2 View Result Date: 04/01/2024 EXAM: 2 VIEW(S) XRAY OF THE CHEST 04/01/2024 12:15:00 PM COMPARISON: 12/27/2023 CLINICAL HISTORY:  cough, hx of lung cancer FINDINGS: LINES, TUBES AND DEVICES: Right chest Port-A-Cath in place with tip overlying the superior cavoatrial junction. LUNGS AND PLEURA: Stable linear opacities from the right hilum compatible with changes due to external beam radiation. Stable small calcified granuloma at left lung base. No pleural effusion. No pneumothorax. HEART AND MEDIASTINUM: No acute abnormality of the cardiac and mediastinal silhouettes. BONES AND SOFT TISSUES: Partially visualized lumbar fixation hardware noted. IMPRESSION: 1. No acute findings. 2. Stable linear opacities from the right hilum compatible with postradiation changes. Electronically signed by: Waddell Calk MD 04/01/2024 12:36 PM EST RP Workstation: [id number]    Procedures Procedures (including critical care time)  Medications Ordered in UC Medications - No data to display  Initial Impression / Assessment and Plan / UC Course  I have reviewed the triage vital signs and the nursing notes.  Pertinent labs & imaging results that were available during my care of the patient were reviewed by me and considered in my medical decision making (see chart for details).     Viral illness-this is most likely a viral illness.  Her COVID, flu testing was negative here today.  Chest x-ray  revealed no acute findings and stable compared to previous. no concerns on exam.  Recommend symptomatic treatment for relief of symptoms.  Over-the-counter indication for headache, body aches and congestion. Rest, hydrate and follow-up as needed  Final Clinical Impressions(s) / UC Diagnoses   Final diagnoses:  Acute cough     Discharge Instructions      Your flu test was negative and your chest x-ray did not show any pneumonia.  This is most likely a bad cold.  You can take over-the-counter medications to include Mucinex, Tylenol  as needed for your symptoms.  Make sure you are drinking fluids and staying hydrated.  Follow-up with your PCP for any  continued issues    ED Prescriptions   None    PDMP not reviewed this encounter.     [1]  Social History Tobacco Use   Smoking status: Former    Current packs/day: 0.00    Average packs/day: 1 pack/day for 39.0 years (39.0 ttl pk-yrs)    Types: Cigarettes    Start date: 41    Quit date: 2022    Years since quitting: 4.0   Smokeless tobacco: Never  Vaping Use   Vaping status: Never Used  Substance Use Topics   Alcohol use: Yes    Alcohol/week: 2.0 standard drinks of alcohol    Types: 2 Glasses of wine per week    Comment: occas   Drug use: No     Adah Wilbert LABOR, FNP 04/01/24 1253  "

## 2024-04-01 NOTE — ED Triage Notes (Addendum)
 Pt c/o cough, nasal congestion, wheezing, HA, hoarseness, sore throat, and chills since Friday. She has been taking mucinex and tylenol  with slight relief. She has used an albuterol  treatment with relief. Reports she has stage 4 lung cancer.

## 2024-04-01 NOTE — Discharge Instructions (Signed)
 Your flu test was negative and your chest x-ray did not show any pneumonia.  This is most likely a bad cold.  You can take over-the-counter medications to include Mucinex, Tylenol  as needed for your symptoms.  Make sure you are drinking fluids and staying hydrated.  Follow-up with your PCP for any continued issues

## 2024-04-03 ENCOUNTER — Telehealth: Payer: Self-pay

## 2024-04-03 ENCOUNTER — Other Ambulatory Visit: Payer: Self-pay | Admitting: Hematology and Oncology

## 2024-04-03 ENCOUNTER — Encounter: Payer: Self-pay | Admitting: Oncology

## 2024-04-03 ENCOUNTER — Other Ambulatory Visit (HOSPITAL_BASED_OUTPATIENT_CLINIC_OR_DEPARTMENT_OTHER): Payer: Self-pay

## 2024-04-03 DIAGNOSIS — C3401 Malignant neoplasm of right main bronchus: Secondary | ICD-10-CM

## 2024-04-03 MED ORDER — AZITHROMYCIN 500 MG PO TABS
500.0000 mg | ORAL_TABLET | Freq: Every day | ORAL | 0 refills | Status: AC
  Filled 2024-04-03: qty 5, 5d supply, fill #0

## 2024-04-03 NOTE — Telephone Encounter (Signed)
 Patient aware antibiotics called in to [facility] pharmacy, she will call with any further concerns.

## 2024-04-03 NOTE — Telephone Encounter (Signed)
 Patient seen in urgent care here 04/01/2024 mucinex and tylenol  as directed using inhaler and nebulizer,hx lung cancer. Patient is congested coughing up yellow sputum , nasal drainage clear. No fever noted taking tylenol  as directed. Had CXR. Wanting further treatment here or PCP?

## 2024-04-06 ENCOUNTER — Other Ambulatory Visit: Payer: Self-pay

## 2024-04-06 ENCOUNTER — Ambulatory Visit: Admitting: Internal Medicine

## 2024-04-09 ENCOUNTER — Other Ambulatory Visit (HOSPITAL_BASED_OUTPATIENT_CLINIC_OR_DEPARTMENT_OTHER): Payer: Self-pay

## 2024-04-09 ENCOUNTER — Encounter: Payer: Self-pay | Admitting: Oncology

## 2024-04-09 ENCOUNTER — Telehealth: Payer: Self-pay

## 2024-04-09 MED ORDER — LEVOFLOXACIN 500 MG PO TABS
500.0000 mg | ORAL_TABLET | Freq: Every day | ORAL | 0 refills | Status: AC
  Filled 2024-04-09: qty 5, 5d supply, fill #0

## 2024-04-09 NOTE — Progress Notes (Unsigned)
 " [facility] at [facility] [address] [place],  [place]  [address] [phone]  Clinic Day: 03/20/2024  Referring physician: Sabas Norleen PARAS., MD   HISTORY OF PRESENT ILLNESS:  The patient is a 64 y.o. female with stage IVB (T2a N2a M1c1) squamous cell lung cancer, which includes spread of disease to her liver.  She comes in today to be evaluated before proceeding with her 8th cycle of maintenance Abraxane .  The patient claims to have tolerated her 6th cycle of Abraxane  very well.  She has definitely noticed an improvement in her daily quality of life since a dose reduction was done with cycle #3.  She denies having any new respiratory symptoms which concern her for progression of her lung cancer.    With respect to her lung cancer history, her maintenance Abraxane  was preceded by 6 cycles of palliative carboplatin -based chemotherapy.  Of note, the patient had an allergic reaction to paclitaxel  after her fourth cycle of carboplatin /paclitaxel  treatment.  Based upon this, she was switched to carboplatin /Abraxane  for her 5th and 6th cycles.  A 20% dose reduction was done with her third cycle of maintenance Abraxane , for which the patient claims helped immensely with her being able to tolerate this agent.    PHYSICAL EXAM:  Last menstrual period 11/15/2011. Wt Readings from Last 3 Encounters:  03/20/24 275 lb (124.7 kg)  03/20/24 275 lb 8 oz (125 kg)  03/01/24 276 lb 9.6 oz (125.5 kg)   There is no height or weight on file to calculate BMI. Performance status (ECOG): 2 - Symptomatic, <50% confined to bed Physical Exam Constitutional:      Appearance: Normal appearance. She is not ill-appearing.     Comments: A pleasant woman in a wheelchair.    HENT:     Mouth/Throat:     Mouth: Mucous membranes are moist.     Pharynx: Oropharynx is clear. No oropharyngeal exudate or posterior oropharyngeal erythema.  Cardiovascular:     Rate and Rhythm: Normal rate and regular  rhythm.     Heart sounds: No murmur heard.    No friction rub. No gallop.  Pulmonary:     Effort: Pulmonary effort is normal. No respiratory distress.     Breath sounds: No wheezing, rhonchi or rales.  Abdominal:     General: Bowel sounds are normal. There is no distension.     Palpations: Abdomen is soft. There is no mass.     Tenderness: There is no abdominal tenderness.  Musculoskeletal:        General: No swelling.     Right lower leg: No edema.     Left lower leg: No edema.  Lymphadenopathy:     Cervical: No cervical adenopathy.     Upper Body:     Right upper body: No supraclavicular or axillary adenopathy.     Left upper body: No supraclavicular or axillary adenopathy.     Lower Body: No right inguinal adenopathy. No left inguinal adenopathy.  Skin:    General: Skin is warm.     Coloration: Skin is not jaundiced.     Findings: No lesion or rash.  Neurological:     General: No focal deficit present.     Mental Status: She is alert and oriented to person, place, and time. Mental status is at baseline.  Psychiatric:        Mood and Affect: Mood normal.        Behavior: Behavior normal.  Thought Content: Thought content normal.    LABS:      Latest Ref Rng & Units 03/20/2024    1:05 PM 03/01/2024   10:20 AM 02/09/2024    1:21 PM  CBC  WBC 4.0 - 10.5 K/uL 7.3  4.9  6.0   Hemoglobin 12.0 - 15.0 g/dL 88.7  88.4  88.7   Hematocrit 36.0 - 46.0 % 33.5  35.0  33.5   Platelets 150 - 400 K/uL 234  232  220       Latest Ref Rng & Units 03/20/2024    1:05 PM 03/01/2024   10:20 AM 02/09/2024    1:21 PM  CMP  Glucose 70 - 99 mg/dL 884  859  889   BUN 8 - 23 mg/dL 9  7  6    Creatinine 0.44 - 1.00 mg/dL 9.31  9.31  9.18   Sodium 135 - 145 mmol/L 132  132  134   Potassium 3.5 - 5.1 mmol/L 4.2  3.7  4.2   Chloride 98 - 111 mmol/L 97  96  98   CO2 22 - 32 mmol/L 25  24  25    Calcium 8.9 - 10.3 mg/dL 89.5  89.4  89.5   Total Protein 6.5 - 8.1 g/dL 7.3  7.3  7.3    Total Bilirubin 0.0 - 1.2 mg/dL 0.4  0.3  0.4   Alkaline Phos 38 - 126 U/L 120  121  127   AST 15 - 41 U/L 24  23  20    ALT 0 - 44 U/L 13  14  17     SCANS: CT scans of the chest/abdomen/pelvis done on 02/29/2024 revealed the following:   FINDINGS:   CHEST:   MEDIASTINUM AND LYMPH NODES: Right sided port-a-cath has tip at the cavoatrial junction. Heart is normal in size. Minimal calcified plaque over the left anterior descending coronary artery. Mild calcified plaque over the right coronary artery. Thoracic aorta demonstrates mild calcified plaque of the descending aorta. Pulmonary arterial system is unremarkable. Remaining vascular structures are normal. No mediastinal, hilar or axillary lymphadenopathy.   LUNGS AND PLEURA: Lungs are adequately inflated without acute airspace process or effusion. There is stable focal density over the medial right lower lobe/infrahilar region compatible with known site of lung cancer. This is less masslike compared to the previous PET CT 04/01/2023. Stable subcentimeter nodule over the lateral left lower lobe (image 85) measuring approximately 7-8 mm. Stable 3-4 mm nodule over the right upper lobe (image 51). Narrowed bronchus extending into the region of consolidation described over the medial right lower lobe unchanged.   ABDOMEN AND PELVIS:   LIVER: No definite focal liver mass.   GALLBLADDER AND BILE DUCTS: Gallbladder and biliary tree are normal.   SPLEEN: No acute abnormality.   PANCREAS: No acute abnormality.   ADRENAL GLANDS: No acute abnormality.   KIDNEYS, URETERS AND BLADDER: Kidneys are normal in size without hydronephrosis. A few small bilateral renal cysts unchanged. No stones in the kidneys or ureters. No perinephric or periureteral stranding. Urinary bladder is unremarkable.   GI AND BOWEL: Stomach demonstrates no acute abnormality. There is no bowel obstruction.   REPRODUCTIVE ORGANS: Previous hysterectomy.  Adnexal regions are unremarkable.   PERITONEUM AND RETROPERITONEUM: No ascites. No free air.   VASCULATURE: Plaque is present over the abdominal aorta which is normal in caliber. Remaining vascular structures are unremarkable.   ABDOMINAL AND PELVIS LYMPH NODES: No lymphadenopathy.   BONES AND SOFT TISSUES: Pleural effusion hardware  intact and unchanged from L1 to L5. No acute osseous abnormality. No focal soft tissue abnormality.   IMPRESSION: 1. Known right lower lobe lung cancer with decreased masslike appearance compared to prior PET CT 04/01/2023. No evidence of metastatic disease in the abdomen or pelvis. 2. Stable 78 mm subcentimeter nodule in the lateral left lower lobe. Stable 34 mm nodule in the right upper lobe. Recommend attention on follow-up.  PATHOLOGY: Bronchoscopy results from 03-07-2023 revealed the following:   LIVER, RIGHT LOBE, LESION, NEEDLE CORE BIOPSY (05-10-23):  Metastatic poorly differentiated carcinoma with extensive necrosis (see comment)   COMMENT:   Sections show cores of hepatic parenchyma infiltrated by a tumor within desmoplastic and fibrotic stroma composed of solid sheets and nests of large atypical cells with marked nuclear cytoplasmic ratio and a variably enlarged round to oval nucleus with scattered conspicuous nucleoli.  The tumor shows frequent apoptotic bodies and geographic tumor necrosis.  No keratinization is seen. Eight immunohistochemical stains are performed with adequate control.  The tumor is diffusely and  strongly positive for the squamous marker p40.  They tumor also shows positivity for CD56 and TTF-1.  The tumor is negative for cytokeratin 7 and the pulmonary adeno marker Napsin A.  The tumor is also negative for the neuroendocrine marker synaptophysin.  The tumor is negative for cytokeratin 20.  The proliferation marker Ki-67 decorates up to 70% of the tumor.  The strong staining for p40 would tend to favor this being a poorly  differentiated squamous carcinoma; however, the tumor shows cytohistomorphology and a proliferation rate more often seen in a neuroendocrine carcinoma.  The tumor also shows positivity for the neuroendocrine markers CD56 and TTF-1 suggesting at least focal neuroendocrine differentiation.  Clinical and radiologic correlation is recommended.   ASSESSMENT & PLAN:  Assessment/Plan:  A 64 y.o. female with stage IVB (T2a N2a M1c1) squamous cell lung cancer, which includes spread of disease to her liver.  She will proceed with her 7th cycle of maintenance Abraxane  today.  Overall, she appears to be doing well.  I will see her back in 3 weeks before she heads into her 8th cycle of maintenance Abraxane .  The patient understands all the plans discussed today and is in agreement with them.    Stanisha Lorenz DELENA Kerns, MD       "

## 2024-04-09 NOTE — Telephone Encounter (Signed)
 Sister [name] called stating that patient is still coughing up yellow-green colored sputum and is due for treatment tomorrow. Spoke with Dr. Ezzard, called in Levaquin  500mg  daily for 5 days and will evaluate at appointment tomorrow. Patients sister made aware and RX called to [employer].

## 2024-04-10 ENCOUNTER — Other Ambulatory Visit: Payer: Self-pay | Admitting: Oncology

## 2024-04-10 ENCOUNTER — Inpatient Hospital Stay: Admitting: Oncology

## 2024-04-10 ENCOUNTER — Inpatient Hospital Stay

## 2024-04-10 ENCOUNTER — Other Ambulatory Visit (HOSPITAL_BASED_OUTPATIENT_CLINIC_OR_DEPARTMENT_OTHER): Payer: Self-pay

## 2024-04-10 ENCOUNTER — Inpatient Hospital Stay: Attending: Oncology

## 2024-04-10 ENCOUNTER — Encounter: Payer: Self-pay | Admitting: Oncology

## 2024-04-10 ENCOUNTER — Inpatient Hospital Stay: Attending: Oncology | Admitting: Oncology

## 2024-04-10 VITALS — BP 110/80 | HR 87 | Temp 98.1°F | Resp 20 | Ht 73.0 in | Wt 275.9 lb

## 2024-04-10 DIAGNOSIS — C3490 Malignant neoplasm of unspecified part of unspecified bronchus or lung: Secondary | ICD-10-CM | POA: Insufficient documentation

## 2024-04-10 DIAGNOSIS — R110 Nausea: Secondary | ICD-10-CM

## 2024-04-10 DIAGNOSIS — C3401 Malignant neoplasm of right main bronchus: Secondary | ICD-10-CM | POA: Diagnosis not present

## 2024-04-10 DIAGNOSIS — Z5111 Encounter for antineoplastic chemotherapy: Secondary | ICD-10-CM | POA: Diagnosis present

## 2024-04-10 DIAGNOSIS — C787 Secondary malignant neoplasm of liver and intrahepatic bile duct: Secondary | ICD-10-CM | POA: Diagnosis not present

## 2024-04-10 LAB — CBC WITH DIFFERENTIAL (CANCER CENTER ONLY)
Abs Immature Granulocytes: 0.1 K/uL — ABNORMAL HIGH (ref 0.00–0.07)
Basophils Absolute: 0.1 K/uL (ref 0.0–0.1)
Basophils Relative: 1 %
Eosinophils Absolute: 0.1 K/uL (ref 0.0–0.5)
Eosinophils Relative: 1 %
HCT: 34.1 % — ABNORMAL LOW (ref 36.0–46.0)
Hemoglobin: 11 g/dL — ABNORMAL LOW (ref 12.0–15.0)
Immature Granulocytes: 1 %
Lymphocytes Relative: 11 %
Lymphs Abs: 0.9 K/uL (ref 0.7–4.0)
MCH: 28.7 pg (ref 26.0–34.0)
MCHC: 32.3 g/dL (ref 30.0–36.0)
MCV: 89 fL (ref 80.0–100.0)
Monocytes Absolute: 1 K/uL (ref 0.1–1.0)
Monocytes Relative: 12 %
Neutro Abs: 6 K/uL (ref 1.7–7.7)
Neutrophils Relative %: 74 %
Platelet Count: 268 K/uL (ref 150–400)
RBC: 3.83 MIL/uL — ABNORMAL LOW (ref 3.87–5.11)
RDW: 15.5 % (ref 11.5–15.5)
WBC Count: 8.1 K/uL (ref 4.0–10.5)
nRBC: 0 % (ref 0.0–0.2)

## 2024-04-10 LAB — CMP (CANCER CENTER ONLY)
ALT: 20 U/L (ref 0–44)
AST: 30 U/L (ref 15–41)
Albumin: 4.2 g/dL (ref 3.5–5.0)
Alkaline Phosphatase: 159 U/L — ABNORMAL HIGH (ref 38–126)
Anion gap: 11 (ref 5–15)
BUN: 7 mg/dL — ABNORMAL LOW (ref 8–23)
CO2: 25 mmol/L (ref 22–32)
Calcium: 10.6 mg/dL — ABNORMAL HIGH (ref 8.9–10.3)
Chloride: 92 mmol/L — ABNORMAL LOW (ref 98–111)
Creatinine: 0.78 mg/dL (ref 0.44–1.00)
GFR, Estimated: >60 mL/min
Glucose, Bld: 132 mg/dL — ABNORMAL HIGH (ref 70–99)
Potassium: 4.8 mmol/L (ref 3.5–5.1)
Sodium: 128 mmol/L — ABNORMAL LOW (ref 135–145)
Total Bilirubin: 0.5 mg/dL (ref 0.0–1.2)
Total Protein: 7.3 g/dL (ref 6.5–8.1)

## 2024-04-10 MED ORDER — SODIUM CHLORIDE 0.9 % IV SOLN: Freq: Once | INTRAVENOUS | Status: AC

## 2024-04-10 MED ORDER — PREDNISONE 10 MG PO TABS
ORAL_TABLET | ORAL | 0 refills | Status: AC
  Filled 2024-04-10: qty 15, 5d supply, fill #0

## 2024-04-10 MED ORDER — DEXAMETHASONE SOD PHOSPHATE PF 10 MG/ML IJ SOLN
10.0000 mg | Freq: Once | INTRAMUSCULAR | Status: AC
  Administered 2024-04-10: 10 mg via INTRAVENOUS
  Filled 2024-04-10: qty 1

## 2024-04-10 NOTE — Patient Instructions (Signed)
 Dexamethasone Injection What is this medication? DEXAMETHASONE (dex a METH a sone) treats many conditions such as asthma, allergic reactions, arthritis, inflammatory bowel diseases, adrenal, and blood or bone marrow disorders. It works by decreasing inflammation and slowing down an overactive immune system. It belongs to a group of medications called steroids. This medicine may be used for other purposes; ask your health care provider or pharmacist if you have questions. COMMON BRAND NAME(S): Decadron, DEX24, DoubleDex, ReadySharp Dexamethasone, Simplist Dexamethasone, Solurex What should I tell my care team before I take this medication? They need to know if you have any of these conditions: Cushing syndrome Diabetes Glaucoma Heart attack Heart disease High blood pressure Infection, such as tuberculosis (TB), bacterial, fungal, or viral infections Kidney disease Liver disease Mental health condition Myasthenia gravis Osteoporosis Seizures Stomach or intestine problems Thyroid disease An unusual or allergic reaction to dexamethasone, lactose, other medications, foods, dyes, or preservatives Pregnant or trying to get pregnant Breastfeeding How should I use this medication? This medication is injected into a muscle, joint, lesion, or other tissue. It is given by your care team in a hospital or clinic setting. Talk to your care team about the use of this medication in children. Special care may be needed. Overdosage: If you think you have taken too much of this medicine contact a poison control center or emergency room at once. NOTE: This medicine is only for you. Do not share this medicine with others. What if I miss a dose? This does not apply. What may interact with this medication? Do not take this medication with any of the following: Live virus vaccines This medication may also interact with the following: Aminoglutethimide Amphotericin B Aspirin and aspirin-like  medications Certain antibiotics, such as erythromycin, clarithromycin, troleandomycin Certain antivirals for HIV or hepatitis Certain medications for seizures, such as carbamazepine, phenobarbital, phenytoin Certain medications to treat myasthenia gravis Cholestyramine Cyclosporine Digoxin Diuretics Ephedrine Estrogen and progestin hormones Insulin or other medications for diabetes Isoniazid Ketoconazole Medications that relax muscles for surgery Mifepristone NSAIDs, medications for pain and inflammation, such as ibuprofen or naproxen Rifampin Skin tests for allergies Thalidomide Vaccines Warfarin This list may not describe all possible interactions. Give your health care provider a list of all the medicines, herbs, non-prescription drugs, or dietary supplements you use. Also tell them if you smoke, drink alcohol, or use illegal drugs. Some items may interact with your medicine. What should I watch for while using this medication? Visit your care team for regular checks on your progress. Tell your care team if your symptoms do not start to get better or if they get worse. Your condition will be monitored carefully while you are receiving this medication. Wear a medical ID bracelet or chain. Carry a card that describes your condition. List the medications and doses you take on the card. This medication may increase your risk of getting an infection. Call your care team for advice if you get a fever, chills, sore throat, or other symptoms of a cold or flu. Do not treat yourself. Try to avoid being around people who are sick. If you have not had the measles or chickenpox vaccines, tell your care team right away if you are around someone with these viruses. If you are going to need surgery or other procedure, tell your care team that you are using this medication. You may need to be on a special diet while you are taking this medication. Ask your care team. Also, find out how many glasses of  fluids you need to drink each day. This medication may increase blood sugar. The risk may be higher in patients who already have diabetes. Ask your care team what you can do to lower your risk of diabetes while taking this medication. What side effects may I notice from receiving this medication? Side effects that you should report to your care team as soon as possible: Allergic reactions--skin rash, itching, hives, swelling of the face, lips, tongue, or throat Cushing syndrome--increased fat around the midsection, upper back, neck, or face, pink or purple stretch marks on the skin, thinning, fragile skin that easily bruises, unexpected hair growth High blood sugar (hyperglycemia)--increased thirst or amount of urine, unusual weakness or fatigue, blurry vision Increase in blood pressure Infection--fever, chills, cough, sore throat, wounds that don't heal, pain or trouble when passing urine, general feeling of discomfort or being unwell Low adrenal gland function--nausea, vomiting, loss of appetite, unusual weakness or fatigue, dizziness Mood and behavior changes--anxiety, nervousness, confusion, hallucinations, irritability, hostility, thoughts of suicide or self-harm, worsening mood, feelings of depression Stomach bleeding--bloody or black, tar-like stools, vomiting blood or brown material that looks like coffee grounds Swelling of the ankles, hands, or feet Side effects that usually do not require medical attention (report to your care team if they continue or are bothersome): Acne General discomfort and fatigue Headache Increase in appetite Nausea Trouble sleeping Weight gain This list may not describe all possible side effects. Call your doctor for medical advice about side effects. You may report side effects to FDA at 1-800-FDA-1088. Where should I keep my medication? This medication is given in a hospital or clinic. It will not be stored at home. NOTE: This sheet is a summary. It may  not cover all possible information. If you have questions about this medicine, talk to your doctor, pharmacist, or health care provider.  2024 Elsevier/Gold Standard (2021-08-20 00:00:00)

## 2024-04-17 ENCOUNTER — Other Ambulatory Visit (HOSPITAL_BASED_OUTPATIENT_CLINIC_OR_DEPARTMENT_OTHER): Payer: Self-pay

## 2024-04-17 ENCOUNTER — Encounter: Payer: Self-pay | Admitting: Oncology

## 2024-04-17 ENCOUNTER — Inpatient Hospital Stay

## 2024-04-17 ENCOUNTER — Other Ambulatory Visit: Payer: Self-pay | Admitting: Hematology and Oncology

## 2024-04-17 ENCOUNTER — Other Ambulatory Visit: Payer: Self-pay | Admitting: Oncology

## 2024-04-17 VITALS — BP 118/77 | HR 89 | Temp 99.0°F | Resp 20

## 2024-04-17 DIAGNOSIS — K1379 Other lesions of oral mucosa: Secondary | ICD-10-CM

## 2024-04-17 DIAGNOSIS — C3401 Malignant neoplasm of right main bronchus: Secondary | ICD-10-CM

## 2024-04-17 DIAGNOSIS — Z5111 Encounter for antineoplastic chemotherapy: Secondary | ICD-10-CM | POA: Diagnosis not present

## 2024-04-17 DIAGNOSIS — B370 Candidal stomatitis: Secondary | ICD-10-CM

## 2024-04-17 LAB — CMP (CANCER CENTER ONLY)
ALT: 30 U/L (ref 0–44)
AST: 32 U/L (ref 15–41)
Albumin: 3.9 g/dL (ref 3.5–5.0)
Alkaline Phosphatase: 146 U/L — ABNORMAL HIGH (ref 38–126)
Anion gap: 11 (ref 5–15)
BUN: 11 mg/dL (ref 8–23)
CO2: 26 mmol/L (ref 22–32)
Calcium: 10.5 mg/dL — ABNORMAL HIGH (ref 8.9–10.3)
Chloride: 95 mmol/L — ABNORMAL LOW (ref 98–111)
Creatinine: 0.74 mg/dL (ref 0.44–1.00)
GFR, Estimated: >60 mL/min
Glucose, Bld: 125 mg/dL — ABNORMAL HIGH (ref 70–99)
Potassium: 4.2 mmol/L (ref 3.5–5.1)
Sodium: 132 mmol/L — ABNORMAL LOW (ref 135–145)
Total Bilirubin: 0.6 mg/dL (ref 0.0–1.2)
Total Protein: 7.2 g/dL (ref 6.5–8.1)

## 2024-04-17 LAB — CBC WITH DIFFERENTIAL (CANCER CENTER ONLY)
Abs Immature Granulocytes: 0.08 K/uL — ABNORMAL HIGH (ref 0.00–0.07)
Basophils Absolute: 0 K/uL (ref 0.0–0.1)
Basophils Relative: 0 %
Eosinophils Absolute: 0.1 K/uL (ref 0.0–0.5)
Eosinophils Relative: 1 %
HCT: 36.8 % (ref 36.0–46.0)
Hemoglobin: 11.8 g/dL — ABNORMAL LOW (ref 12.0–15.0)
Immature Granulocytes: 1 %
Lymphocytes Relative: 9 %
Lymphs Abs: 0.9 K/uL (ref 0.7–4.0)
MCH: 28.9 pg (ref 26.0–34.0)
MCHC: 32.1 g/dL (ref 30.0–36.0)
MCV: 90 fL (ref 80.0–100.0)
Monocytes Absolute: 0.7 K/uL (ref 0.1–1.0)
Monocytes Relative: 7 %
Neutro Abs: 8 K/uL — ABNORMAL HIGH (ref 1.7–7.7)
Neutrophils Relative %: 82 %
Platelet Count: 221 K/uL (ref 150–400)
RBC: 4.09 MIL/uL (ref 3.87–5.11)
RDW: 15.7 % — ABNORMAL HIGH (ref 11.5–15.5)
WBC Count: 9.7 K/uL (ref 4.0–10.5)
nRBC: 0 % (ref 0.0–0.2)

## 2024-04-17 MED ORDER — FLUCONAZOLE 100 MG PO TABS
ORAL_TABLET | ORAL | 1 refills | Status: AC
  Filled 2024-04-17: qty 6, 5d supply, fill #0

## 2024-04-17 MED ORDER — PACLITAXEL PROTEIN-BOUND CHEMO INJECTION 100 MG
124.8000 mg/m2 | Freq: Once | INTRAVENOUS | Status: AC
  Administered 2024-04-17: 300 mg via INTRAVENOUS
  Filled 2024-04-17: qty 60

## 2024-04-17 MED ORDER — ONDANSETRON HCL 4 MG/2ML IJ SOLN
8.0000 mg | Freq: Once | INTRAMUSCULAR | Status: AC
  Administered 2024-04-17: 8 mg via INTRAVENOUS
  Filled 2024-04-17: qty 4

## 2024-04-17 MED ORDER — DEXAMETHASONE SOD PHOSPHATE PF 10 MG/ML IJ SOLN
4.0000 mg | Freq: Once | INTRAMUSCULAR | Status: AC
  Administered 2024-04-17: 4 mg via INTRAVENOUS
  Filled 2024-04-17: qty 1

## 2024-04-17 MED ORDER — SODIUM CHLORIDE 0.9 % IV SOLN: INTRAVENOUS | Status: DC

## 2024-04-17 NOTE — Patient Instructions (Signed)
Paclitaxel Nanoparticle Albumin-Bound Injection What is this medication? NANOPARTICLE ALBUMIN-BOUND PACLITAXEL (Na no PAHR ti kuhl al BYOO muhn-bound PAK li TAX el) treats some types of cancer. It works by slowing down the growth of cancer cells. This medicine may be used for other purposes; ask your health care provider or pharmacist if you have questions. COMMON BRAND NAME(S): Abraxane What should I tell my care team before I take this medication? They need to know if you have any of these conditions: Liver disease Low white blood cell levels An unusual or allergic reaction to paclitaxel, albumin, other medications, foods, dyes, or preservatives If you or your partner are pregnant or trying to get pregnant Breast-feeding How should I use this medication? This medication is injected into a vein. It is given by your care team in a hospital or clinic setting. Talk to your care team about the use of this medication in children. Special care may be needed. Overdosage: If you think you have taken too much of this medicine contact a poison control center or emergency room at once. NOTE: This medicine is only for you. Do not share this medicine with others. What if I miss a dose? Keep appointments for follow-up doses. It is important not to miss your dose. Call your care team if you are unable to keep an appointment. What may interact with this medication? Other medications may affect the way this medication works. Talk with your care team about all of the medications you take. They may suggest changes to your treatment plan to lower the risk of side effects and to make sure your medications work as intended. This list may not describe all possible interactions. Give your health care provider a list of all the medicines, herbs, non-prescription drugs, or dietary supplements you use. Also tell them if you smoke, drink alcohol, or use illegal drugs. Some items may interact with your medicine. What  should I watch for while using this medication? Your condition will be monitored carefully while you are receiving this medication. You may need blood work while taking this medication. This medication may make you feel generally unwell. This is not uncommon as chemotherapy can affect healthy cells as well as cancer cells. Report any side effects. Continue your course of treatment even though you feel ill unless your care team tells you to stop. This medication can cause serious allergic reactions. To reduce the risk, your care team may give you other medications to take before receiving this one. Be sure to follow the directions from your care team. This medication may increase your risk of getting an infection. Call your care team for advice if you get a fever, chills, sore throat, or other symptoms of a cold or flu. Do not treat yourself. Try to avoid being around people who are sick. This medication may increase your risk to bruise or bleed. Call your care team if you notice any unusual bleeding. Be careful brushing or flossing your teeth or using a toothpick because you may get an infection or bleed more easily. If you have any dental work done, tell your dentist you are receiving this medication. Talk to your care team if you or your partner may be pregnant. Serious birth defects can occur if you take this medication during pregnancy and for 6 months after the last dose. You will need a negative pregnancy test before starting this medication. Contraception is recommended while taking this medication and for 6 months after the last dose. Your care team   can help you find the option that works for you. If your partner can get pregnant, use a condom during sex while taking this medication and for 3 months after the last dose. Do not breastfeed while taking this medication and for 2 weeks after the last dose. This medication may cause infertility. Talk to your care team if you are concerned about your  fertility. What side effects may I notice from receiving this medication? Side effects that you should report to your care team as soon as possible: Allergic reactions--skin rash, itching, hives, swelling of the face, lips, tongue, or throat Dry cough, shortness of breath or trouble breathing Infection--fever, chills, cough, sore throat, wounds that don't heal, pain or trouble when passing urine, general feeling of discomfort or being unwell Low red blood cell level--unusual weakness or fatigue, dizziness, headache, trouble breathing Pain, tingling, or numbness in the hands or feet Stomach pain, unusual weakness or fatigue, nausea, vomiting, diarrhea, or fever that lasts longer than expected Unusual bruising or bleeding Side effects that usually do not require medical attention (report to your care team if they continue or are bothersome): Diarrhea Fatigue Hair loss Loss of appetite Nausea Vomiting This list may not describe all possible side effects. Call your doctor for medical advice about side effects. You may report side effects to FDA at 1-800-FDA-1088. Where should I keep my medication? This medication is given in a hospital or clinic. It will not be stored at home. NOTE: This sheet is a summary. It may not cover all possible information. If you have questions about this medicine, talk to your doctor, pharmacist, or health care provider.  2024 Elsevier/Gold Standard (2021-07-30 00:00:00)  

## 2024-05-08 ENCOUNTER — Inpatient Hospital Stay

## 2024-05-08 ENCOUNTER — Inpatient Hospital Stay: Admitting: Oncology

## 2024-05-09 ENCOUNTER — Ambulatory Visit: Admitting: Internal Medicine
